# Patient Record
Sex: Female | Born: 1950
Health system: Southern US, Community
[De-identification: ages and names within clinical notes are randomized; demographics above are authoritative.]

## PROBLEM LIST (undated history)

## (undated) DIAGNOSIS — F458 Other somatoform disorders: Secondary | ICD-10-CM

## (undated) DIAGNOSIS — M19019 Primary osteoarthritis, unspecified shoulder: Secondary | ICD-10-CM

## (undated) DIAGNOSIS — M1990 Unspecified osteoarthritis, unspecified site: Secondary | ICD-10-CM

## (undated) DIAGNOSIS — M8580 Other specified disorders of bone density and structure, unspecified site: Secondary | ICD-10-CM

## (undated) DIAGNOSIS — I639 Cerebral infarction, unspecified: Secondary | ICD-10-CM

## (undated) DIAGNOSIS — G2581 Restless legs syndrome: Secondary | ICD-10-CM

## (undated) DIAGNOSIS — G4733 Obstructive sleep apnea (adult) (pediatric): Secondary | ICD-10-CM

## (undated) DIAGNOSIS — R2689 Other abnormalities of gait and mobility: Secondary | ICD-10-CM

## (undated) DIAGNOSIS — M26609 Unspecified temporomandibular joint disorder, unspecified side: Secondary | ICD-10-CM

## (undated) DIAGNOSIS — I7389 Other specified peripheral vascular diseases: Secondary | ICD-10-CM

## (undated) DIAGNOSIS — E039 Hypothyroidism, unspecified: Secondary | ICD-10-CM

## (undated) DIAGNOSIS — Z974 Presence of external hearing-aid: Secondary | ICD-10-CM

## (undated) DIAGNOSIS — R413 Other amnesia: Secondary | ICD-10-CM

## (undated) DIAGNOSIS — H9319 Tinnitus, unspecified ear: Secondary | ICD-10-CM

## (undated) DIAGNOSIS — H269 Unspecified cataract: Secondary | ICD-10-CM

## (undated) DIAGNOSIS — I700 Atherosclerosis of aorta: Secondary | ICD-10-CM

## (undated) DIAGNOSIS — N393 Stress incontinence (female) (male): Secondary | ICD-10-CM

## (undated) DIAGNOSIS — M75102 Unspecified rotator cuff tear or rupture of left shoulder, not specified as traumatic: Secondary | ICD-10-CM

## (undated) DIAGNOSIS — I7300 Raynaud's syndrome without gangrene: Secondary | ICD-10-CM

## (undated) DIAGNOSIS — R269 Unspecified abnormalities of gait and mobility: Secondary | ICD-10-CM

## (undated) DIAGNOSIS — Z8673 Personal history of transient ischemic attack (TIA), and cerebral infarction without residual deficits: Secondary | ICD-10-CM

## (undated) DIAGNOSIS — F329 Major depressive disorder, single episode, unspecified: Secondary | ICD-10-CM

## (undated) DIAGNOSIS — F32A Depression, unspecified: Secondary | ICD-10-CM

## (undated) DIAGNOSIS — R296 Repeated falls: Secondary | ICD-10-CM

## (undated) DIAGNOSIS — I10 Essential (primary) hypertension: Secondary | ICD-10-CM

## (undated) DIAGNOSIS — I2510 Atherosclerotic heart disease of native coronary artery without angina pectoris: Secondary | ICD-10-CM

## (undated) DIAGNOSIS — M24112 Other articular cartilage disorders, left shoulder: Secondary | ICD-10-CM

## (undated) HISTORY — DX: Obstructive sleep apnea (adult) (pediatric): G47.33

## (undated) HISTORY — DX: Restless legs syndrome: G25.81

## (undated) HISTORY — DX: Other specified disorders of bone density and structure, unspecified site: M85.80

## (undated) HISTORY — DX: Other abnormalities of gait and mobility: R26.89

## (undated) HISTORY — DX: Other amnesia: R41.3

## (undated) HISTORY — DX: Atherosclerosis of aorta: I70.0

## (undated) HISTORY — DX: Stress incontinence (female) (male): N39.3

## (undated) HISTORY — DX: Unspecified abnormalities of gait and mobility: R26.9

## (undated) HISTORY — PX: TENDON REPAIR: SHX5111

## (undated) HISTORY — PX: OTHER SURGICAL HISTORY: SHX169

## (undated) HISTORY — DX: Raynaud's syndrome without gangrene: I73.00

## (undated) HISTORY — DX: Atherosclerotic heart disease of native coronary artery without angina pectoris: I25.10

## (undated) HISTORY — PX: TONSILLECTOMY: SUR1361

## (undated) HISTORY — DX: Repeated falls: R29.6

## (undated) HISTORY — DX: Essential (primary) hypertension: I10

## (undated) HISTORY — PX: WRIST SURGERY: SHX841

## (undated) HISTORY — DX: Tinnitus, unspecified ear: H93.19

---

## 2001-06-21 ENCOUNTER — Ambulatory Visit (HOSPITAL_COMMUNITY): Admission: RE | Admit: 2001-06-21 | Discharge: 2001-06-21 | Payer: Self-pay | Admitting: Gastroenterology

## 2004-08-03 ENCOUNTER — Ambulatory Visit: Payer: Self-pay | Admitting: Unknown Physician Specialty

## 2005-06-08 ENCOUNTER — Ambulatory Visit: Payer: Self-pay | Admitting: Family Medicine

## 2005-08-09 ENCOUNTER — Ambulatory Visit: Payer: Self-pay | Admitting: Unknown Physician Specialty

## 2006-09-05 ENCOUNTER — Ambulatory Visit: Payer: Self-pay | Admitting: Unknown Physician Specialty

## 2007-10-15 ENCOUNTER — Ambulatory Visit: Payer: Self-pay | Admitting: Unknown Physician Specialty

## 2008-11-26 ENCOUNTER — Ambulatory Visit: Payer: Self-pay | Admitting: Unknown Physician Specialty

## 2008-12-02 ENCOUNTER — Ambulatory Visit: Payer: Self-pay | Admitting: Unknown Physician Specialty

## 2009-01-21 ENCOUNTER — Ambulatory Visit: Payer: Self-pay | Admitting: Internal Medicine

## 2009-05-27 ENCOUNTER — Ambulatory Visit: Payer: Self-pay | Admitting: Family Medicine

## 2009-05-31 ENCOUNTER — Encounter: Admission: RE | Admit: 2009-05-31 | Discharge: 2009-05-31 | Payer: Self-pay | Admitting: Chiropractic Medicine

## 2009-12-15 ENCOUNTER — Ambulatory Visit: Payer: Self-pay | Admitting: Unknown Physician Specialty

## 2010-08-14 DIAGNOSIS — I2510 Atherosclerotic heart disease of native coronary artery without angina pectoris: Secondary | ICD-10-CM

## 2010-08-14 HISTORY — DX: Atherosclerotic heart disease of native coronary artery without angina pectoris: I25.10

## 2011-01-03 ENCOUNTER — Ambulatory Visit: Payer: Self-pay | Admitting: Unknown Physician Specialty

## 2011-01-05 ENCOUNTER — Ambulatory Visit: Payer: Self-pay | Admitting: Unknown Physician Specialty

## 2011-01-25 ENCOUNTER — Ambulatory Visit (HOSPITAL_COMMUNITY): Payer: BC Managed Care – PPO

## 2011-01-25 ENCOUNTER — Ambulatory Visit (HOSPITAL_COMMUNITY)
Admission: RE | Admit: 2011-01-25 | Discharge: 2011-01-26 | Disposition: A | Discharge status: Home / Self Care | Payer: BC Managed Care – PPO | Source: Ambulatory Visit | Attending: Interventional Cardiology | Admitting: Interventional Cardiology

## 2011-01-25 DIAGNOSIS — H532 Diplopia: Secondary | ICD-10-CM | POA: Insufficient documentation

## 2011-01-25 DIAGNOSIS — F329 Major depressive disorder, single episode, unspecified: Secondary | ICD-10-CM | POA: Insufficient documentation

## 2011-01-25 DIAGNOSIS — I6340 Cerebral infarction due to embolism of unspecified cerebral artery: Secondary | ICD-10-CM | POA: Insufficient documentation

## 2011-01-25 DIAGNOSIS — F3289 Other specified depressive episodes: Secondary | ICD-10-CM | POA: Insufficient documentation

## 2011-01-25 DIAGNOSIS — E785 Hyperlipidemia, unspecified: Secondary | ICD-10-CM | POA: Insufficient documentation

## 2011-01-25 DIAGNOSIS — R9439 Abnormal result of other cardiovascular function study: Secondary | ICD-10-CM | POA: Insufficient documentation

## 2011-01-25 DIAGNOSIS — I2510 Atherosclerotic heart disease of native coronary artery without angina pectoris: Secondary | ICD-10-CM | POA: Insufficient documentation

## 2011-01-25 DIAGNOSIS — I679 Cerebrovascular disease, unspecified: Secondary | ICD-10-CM | POA: Insufficient documentation

## 2011-01-25 DIAGNOSIS — I639 Cerebral infarction, unspecified: Secondary | ICD-10-CM

## 2011-01-25 HISTORY — PX: CORONARY ANGIOPLASTY WITH STENT PLACEMENT: SHX49

## 2011-01-25 HISTORY — DX: Cerebral infarction, unspecified: I63.9

## 2011-01-25 LAB — POCT ACTIVATED CLOTTING TIME: Activated Clotting Time: 452 seconds

## 2011-01-26 DIAGNOSIS — R42 Dizziness and giddiness: Secondary | ICD-10-CM

## 2011-01-26 LAB — CBC
HCT: 38.5 % (ref 36.0–46.0)
MCV: 92.1 fL (ref 78.0–100.0)
RBC: 4.18 MIL/uL (ref 3.87–5.11)

## 2011-01-26 LAB — BASIC METABOLIC PANEL
BUN: 13 mg/dL (ref 6–23)
Chloride: 103 mEq/L (ref 96–112)
Creatinine, Ser: 0.74 mg/dL (ref 0.4–1.2)
GFR calc Af Amer: >60 mL/min (ref >60)
Glucose, Bld: 88 mg/dL (ref 70–99)
Potassium: 4 mEq/L (ref 3.5–5.1)

## 2011-01-26 LAB — LIPID PANEL
HDL: 58 mg/dL (ref >39)
LDL Cholesterol: 120 mg/dL — ABNORMAL HIGH (ref 0–99)
Triglycerides: 88 mg/dL (ref <150)

## 2011-01-26 LAB — AST: AST: 28 U/L (ref 0–37)

## 2011-02-16 NOTE — Discharge Summary (Signed)
  Beth Christian, STALLING NO.:  [id number]  MEDICAL RECORD NO.:  [id number]  LOCATION:  6529                         FACILITY:  [facility]  PHYSICIAN:  Lyn Records, M.D.   DATE OF BIRTH:  07/04/1951  DATE OF ADMISSION:  01/25/2011 DATE OF DISCHARGE:  01/26/2011                              DISCHARGE SUMMARY   This is a correction to the discharge medication list:  Effient has not been prescribed.  The patient will be on Plavix 75 mg daily.  Also nitroglycerin 0.4 mg sublingually p.r.n. is prescribed.  Additionally, the patient was seen by Neurology and cleared for discharge.  She is felt to have a good prognosis for complete recovery. She will be discharged today pending the results of carotid Doppler evaluation.     Lyn Records, M.D.     HWS/MEDQ  D:  01/26/2011  T:  01/27/2011  Job:  [id number]  Electronically Signed by Verdis Prime M.D. on 02/16/2011 01:38:11 PM

## 2011-02-16 NOTE — Cardiovascular Report (Signed)
Beth Christian, Beth Christian NO.:  [id number]  MEDICAL RECORD NO.:  [id number]  LOCATION:  6529                         FACILITY:  [facility]  PHYSICIAN:  Lyn Records, M.D.   DATE OF BIRTH:  03-29-1951  DATE OF PROCEDURE:  01/25/2011 DATE OF DISCHARGE:                           CARDIAC CATHETERIZATION   INDICATIONS FOR PROCEDURE:  The patient is 42 and had an early positive exercise treadmill test performed today in our office.  The treadmill was remarkable for inferior ST elevation.  No chest pain occurred. Because of this abnormality, we decided to do urgent catheterization to define coronary anatomy.  She gives a 6-8 week history of exertional angina, although states that she has had no chest discomfort in the past 2 weeks.  PROCEDURE PERFORMED: 1. Left heart catheterization via the right radial approach. 2. Coronary angiography. 3. Left ventriculography. 4. Drug-eluting stent proximal RCA.  DESCRIPTION:  A 5-French sheath was inserted into the right radial artery using micropuncture technique.  We then gave 50 units per kg of heparin.  Verapamil was given intra-arterially to prevent spasm.  We then advanced into the ascending aorta over a tight J 0.035 exchange length guidewire.  Using an A2 5-French multipurpose catheter, we performed left ventriculography, right coronary angiography, and flush injections of the left coronary.  Of note is heavy focal calcification in the mid RCA, the distal left main, and the mid left anterior descending.  We used a 3.5 x 5-French left Judkins catheter for left coronary angiography.  We could not get coaxial images and changed to a 3.0 cm left coronary 5-French guide catheter for left coronary diagnostic images.  There was mild catheter damping with engagement in the left main.  Flow was brisk.  After studying the patient's images and correlating this with the patient's presenting clinical data, we decided to perform PCI on  the right coronary.  We used a 5-French Judkins right guide catheter and a BMW guidewire. Angiomax bolus followed by an infusion was given.  A 600 mg of Plavix was administered.  We used a 2.5-mm x 12-mm long balloon.  Two balloon inflations were performed at 10 atmospheres.  We then positioned and deployed a 3.0 x 16-mm long Promus element stent to 12 atmospheres.  Two balloon inflations were performed.  We then postdilated with a 325 x 12- mm Brandon Quantum.  Two balloon inflations to 14 atmospheres were performed. The patient had ST-segment changes with each balloon inflation but no chest pain.  The final angiographic images revealed a nice result with brisk flow.  A 400 mcg of intracoronary nitroglycerin was administered during the case. Angiomax was discontinued after the last post still balloon inflation.  A wristband was applied with good hemostasis.  No significant complications occurred during this procedure.  RESULTS:  I  Hemodynamic data: A.  Aortic pressure 143/77. B.  Left ventricular pressure 143/27 mmHg. 1. Left ventriculography:  Left ventricular cavity size and function     are normal, EF is 65%. 2. Coronary angiography.     a.     Left main coronary:  There is 25-30% narrowing in the      proximal left main.  The distal left main is heavily calcified.      Catheter damping is noted with engagement with Judkins catheter      due to wedging.  Left catheter damping is noted with a 5-French      EBU guide catheter.  Final grade on the left main is 20-40%      stenosis.     b.     Left anterior descending coronary:  Left anterior descending      is widely patent but with irregularities in the midvessel and      heavy calcification.  Three diagonal branches arise from the left      anterior descending.  They are all widely patent.  The LAD is      transapical.  There is up to 30-40% narrowing in the mid LAD.     c.     Circumflex artery:  Two obtuse marginal branches  arise from      the circumflex.  The territory supplied is relatively small.  No      high-grade obstruction is seen.     d.     Right coronary:  The right coronary is dominant and heavily      calcified.  Gives origin to a large PDA that contains proximal 50%      narrowing.  Three left ventricular branches arise from it.  The      proximal right coronary contains somewhat concentric 95% stenosis.      There is mid vessel calcification with 50% narrowing.  TIMI flow      was grade 3. 3. PCI of the right coronary:  RCA 95%, proximal stenosis reduced to     0% with TIMI grade 3 flow using a Promus element drug-eluting     stent.  TIMI grade 3 flow was noted.  CONCLUSIONS: 1. Early positive exercise treadmill test with inferior ST elevation     secondary to high-grade proximal RCA stenosis. 2. Successful drug-eluting stent implantation in the proximal RCA     resulting in 0% stenosis and TIMI grade 3 flow. 3. Heavy calcification of both the left and right coronaries as     outlined above and 25-30% proximal left main.  PLAN:  Aggressive risk factor modification, Effient therapy for at least 12 months.  Close clinical followup.     Lyn Records, M.D.     HWS/MEDQ  D:  01/25/2011  T:  01/26/2011  Job:  [id number]  cc:   Thora Lance, M.D.  Electronically Signed by Verdis Prime M.D. on 02/16/2011 01:37:31 PM

## 2011-02-16 NOTE — Discharge Summary (Signed)
NAMELINNELL, SWORDS NO.:  [id number]  MEDICAL RECORD NO.:  [id number]  LOCATION:  6529                         FACILITY:  [facility]  PHYSICIAN:  Lyn Records, M.D.   DATE OF BIRTH:  15-May-1951  DATE OF ADMISSION:  01/25/2011 DATE OF DISCHARGE:  01/26/2011                              DISCHARGE SUMMARY   REASON FOR ADMISSION:  Early positive excise treadmill test with ST- segment elevation on exercise within the first 4 minutes of treadmill testing.  DISCHARGE DIAGNOSES: 1. Coronary atherosclerotic heart disease.     a.     Early positive excise treadmill test on January 25, 2011.     b.     Coronary angiography performed on January 25, 2011,      demonstrating 99% proximal right coronary artery stenosis as the      likely culprit for the patient's abnormal exercise test and there      is 25-40% proximal left main.  Both coronaries were heavily      calcified and visible without contrast injection.     c.     Successful stenting with drug-eluting stent of the proximal      right coronary artery. 2. Embolic cerebrovascular accident likely occurring at the time of     catheterization resulting in double vision.     a.     Small pontine acute infarction by MRI/MRA. 3. Cerebrovascular disease with evidence of intracranial microvascular     disease and cerebral atrophy. 4. Hyperlipidemia. 5. History of depression.  PROCEDURES PERFORMED: 1. Diagnostic catheterization and PCI of the RCA January 25, 2011, Dr.     Verdis Prime. 2. CT scan of the brain January 25, 2011. 3. MRI of the brain January 25, 2011. 4. Carotid ultrasound January 26, 2011, pending at the time of discharge.  CONSULT:  Dr. Terrace Arabia, [facility].  DISCHARGE INSTRUCTIONS: 1. Phase II cardiac rehab. 2. Follow up with [facility]. 3. Follow up with Dr. Verdis Prime on February 06, 2011, at 3:45 p.m.  MEDICATIONS: 1. Aspirin 325 mg per day. 2. Prasugrel 10 mg per day. 3.  Rosuvastatin 20 mg daily. 4. Bupropion XL 150 mg three times daily. 5. Calcium OTC 1 tablet daily. 6. Citalopram 20 mg daily. 7. Clonazepam 0.5 mg daily at bedtime. 8. Levothyroxine 75 mcg per day. 9. Multiple vitamins one daily. 10.Omega-3 fatty acid one daily. 11.Vitamin B12 one daily. 12.Vitamin D3 one daily. 13.Vitamin B1 daily. 14.Discontinue meloxicam.  ACTIVITY:  As tolerated and instructed by Cardiac Rehab.  RETURN TO WORK:  After February 06, 2011.  SPECIAL PRECAUTIONS:  Call if new neurological symptoms or chest pain.  HISTORY AND PHYSICAL AND HOSPITAL COURSE:  Please see the transcribed history and physical from my office dated January 25, 2011.  Basically, the patient has less than 66-month history of exertional chest pain and had an exercise Cardiolite performed at the office on the day of admission. She was unable to walk beyond 5 minutes and developed inferior ST elevation with exercise but in absence of chest pain.  Because of this, the patient and family were instructed to undergo coronary angiography. We discussed performing this electively  but because of personal family issues the decision was made to go ahead and send the patient to the hospital and perform the procedure on the day of admission, January 25, 2011.  Please see the dictated cardiac catheterization and PCI report.  She was found to have heavily calcified coronary arteries that were visible by fluoroscopy involving especially the proximal and mid right coronary, the distal left main and the mid LAD territory.  Angiography demonstrated high-grade obstruction in the proximal RCA which was compatible with abnormalities noted on the stress test.  There was mild- to-moderate disease in the ostial/proximal left main in the mid LAD but not critically significant.  LV function was normal.  DES stent was implanted successfully via the right radial approach.  After the procedure and while discussing the results  with the patient, she complained of double vision as I showed her the images on the monitors in the cath lab.  She was also somewhat groggy and still sedated from Versed and fentanyl.  In recovery 15-30 minutes later, she still complained of having diplopia.  At that point, I called a code stroke.  The patient was then evaluated by the Neurology Service and had CT and MR scans performed and felt to have had a small embolic pontine CVA. The patient's major clinical finding is double vision.  Eye patch was recommended by Dr. Terrace Arabia.  Carotid Doppler studies are pending at the time of this dictation.  The patient has not had any chest discomfort or EKG changes of significance.  Discharge laboratory data revealed normal electrolytes and renal function.  Discharge will be pending further evaluation and recommendations from the Neurology Service.     Lyn Records, M.D.     HWS/MEDQ  D:  01/26/2011  T:  01/26/2011  Job:  [id number]  cc:   Levert Feinstein, MD Thora Lance, M.D.  Electronically Signed by Verdis Prime M.D. on 02/16/2011 01:37:44 PM

## 2011-02-27 ENCOUNTER — Encounter: Payer: Self-pay | Admitting: Interventional Cardiology

## 2011-03-07 NOTE — Consult Note (Signed)
Beth Christian, DEJARNETT NO.:  [id number]  MEDICAL RECORD NO.:  [id number]  LOCATION:  6529                         FACILITY:  [facility]  PHYSICIAN:  Levert Feinstein, MD          DATE OF BIRTH:  [birth year]  DATE OF CONSULTATION: DATE OF DISCHARGE:                                CONSULTATION   CHIEF COMPLAINT:  Acute onset of double vision.  HISTORY OF PRESENT ILLNESS:  The patient is a pleasant 60 year old right- handed Caucasian female with her husband at the bedside.  She underwent cardiac catheter this afternoon, found to have significant right coronary artery disease, had drug-eluting stent placement.  She underwent procedure around 4:45 p.m., was given repeated dose of versed, also fentanyl.  She also received heparin bonus 3200 units at4:57 p.m., Plavix 600 mg p.o. at 5:33 p.m.  When she came out of the procedure, became more alert.  She noticed double vision.  It was described as vertigo, binocular, persistent since its onset.  She denies vertigo, lateralized motor or sensory deficit. This has led to a CAT scan of the brain.  There were no acute findings. MRI of the brain has demonstrated faint positive DWI lesions involving right pontine paramedian, left frontal subcortical white matters.  There was also mild brain atrophy, periventricular white matter disease.  REVIEW OF SYSTEMS:  Pertinent as above.  PAST MEDICAL HISTORY: 1. Depression. 2. Coronary artery disease.  PAST SURGICAL HISTORY:  Thumb surgery post injury.  ALLERGIES:  No known drug allergy.  FAMILY HISTORY:  Father had coronary artery disease at age 106, also suffered kidney disease.  Mother at age 60, is healthy.  SOCIAL HISTORY:  She works in [employer], community, plan to take a few weeks off during summer.  Denies smoking or drinking.  HOME MEDICATIONS: 1. Bupropion XL 150 mg 3 times a day. 2. Citalopram 20 mg every day. 3. Levothyroxine 75 mcg every day. 4. Meloxicam 15 mg every day. 5.  Clonazepam 0.5 mg at bedtime. 6. Aspirin 81 mg every day. 7. Omega-3 one capsule every day. 8. Multivitamin 1 tablet every day. 9. Vitamin D3 one tablet every day. 10.Calcium every day. 11.Vitamin E. 12.Vitamin B12.  PHYSICAL EXAMINATION:  VITAL SIGNS:  She is afebrile, blood pressure 126/68, heart rate of 68. NEUROLOGICAL:  She is awake, alert.  No dysarthria.  No aphasia. Cranial nerves II through XII; pupils equal, round, reactive to light. Mild disconjugate eye movement.  Right hyperphoria, slight left hypophoria, and full extraocular movement and the facial sensation and strength was normal.  Uvula and tongue midline.  Head turning and shoulder shrugging normal, symmetric.  Tongue protrusion into cheek strength was normal.  Motor examination; normal tone, bulk, and strength on 4 extremities.  Sensory normal to light touch and there was no extinction on double spontaneous stimulation.  There was no dysmetria noticed.  ASSESSMENT AND PLAN:  A 61 year old female with vascular risk factor of coronary artery disease, status post cardiac catheter, now presenting with vertical double vision, consistent with a small brainstem acute stroke, likely embolic event.  1. Keep antiplatelet agent. 2. She is not a t-PA candidate because of unknown time of onset,  also     has received a heparin, large dose of Plavix during her cardiac     catheter 3. Eye patch. 4. Ultrasound of carotid artery, will be followed by stroke MD, Dr.     Pennie Rushing in the morning.     Levert Feinstein, MD     YY/MEDQ  D:  01/25/2011  T:  01/26/2011  Job:  [id number]  Electronically Signed by Levert Feinstein MD on 03/07/2011 08:28:41 AM

## 2011-03-08 ENCOUNTER — Ambulatory Visit: Payer: Self-pay | Admitting: Ophthalmology

## 2011-03-15 ENCOUNTER — Encounter: Payer: Self-pay | Admitting: Interventional Cardiology

## 2011-04-15 ENCOUNTER — Encounter: Payer: Self-pay | Admitting: Interventional Cardiology

## 2011-07-04 ENCOUNTER — Encounter: Payer: Self-pay | Admitting: Emergency Medicine

## 2011-07-04 ENCOUNTER — Emergency Department (HOSPITAL_COMMUNITY): Payer: BC Managed Care – PPO

## 2011-07-04 ENCOUNTER — Inpatient Hospital Stay (HOSPITAL_COMMUNITY)
Admission: EM | Admit: 2011-07-04 | Discharge: 2011-07-05 | DRG: 143 | Disposition: A | Discharge status: Home / Self Care | Payer: BC Managed Care – PPO | Attending: Interventional Cardiology | Admitting: Interventional Cardiology

## 2011-07-04 ENCOUNTER — Other Ambulatory Visit: Payer: Self-pay

## 2011-07-04 DIAGNOSIS — I2510 Atherosclerotic heart disease of native coronary artery without angina pectoris: Secondary | ICD-10-CM | POA: Diagnosis present

## 2011-07-04 DIAGNOSIS — R079 Chest pain, unspecified: Principal | ICD-10-CM | POA: Diagnosis present

## 2011-07-04 DIAGNOSIS — F32A Depression, unspecified: Secondary | ICD-10-CM | POA: Insufficient documentation

## 2011-07-04 DIAGNOSIS — F329 Major depressive disorder, single episode, unspecified: Secondary | ICD-10-CM | POA: Diagnosis present

## 2011-07-04 DIAGNOSIS — I7389 Other specified peripheral vascular diseases: Secondary | ICD-10-CM | POA: Insufficient documentation

## 2011-07-04 DIAGNOSIS — E039 Hypothyroidism, unspecified: Secondary | ICD-10-CM | POA: Diagnosis present

## 2011-07-04 DIAGNOSIS — N393 Stress incontinence (female) (male): Secondary | ICD-10-CM | POA: Insufficient documentation

## 2011-07-04 DIAGNOSIS — H9319 Tinnitus, unspecified ear: Secondary | ICD-10-CM | POA: Diagnosis present

## 2011-07-04 DIAGNOSIS — Z7902 Long term (current) use of antithrombotics/antiplatelets: Secondary | ICD-10-CM

## 2011-07-04 DIAGNOSIS — Z8673 Personal history of transient ischemic attack (TIA), and cerebral infarction without residual deficits: Secondary | ICD-10-CM

## 2011-07-04 DIAGNOSIS — F3289 Other specified depressive episodes: Secondary | ICD-10-CM | POA: Diagnosis present

## 2011-07-04 DIAGNOSIS — M1990 Unspecified osteoarthritis, unspecified site: Secondary | ICD-10-CM | POA: Insufficient documentation

## 2011-07-04 DIAGNOSIS — Z7982 Long term (current) use of aspirin: Secondary | ICD-10-CM

## 2011-07-04 DIAGNOSIS — Z87891 Personal history of nicotine dependence: Secondary | ICD-10-CM

## 2011-07-04 HISTORY — DX: Other specified peripheral vascular diseases: I73.89

## 2011-07-04 HISTORY — DX: Unspecified osteoarthritis, unspecified site: M19.90

## 2011-07-04 HISTORY — DX: Depression, unspecified: F32.A

## 2011-07-04 HISTORY — DX: Hypothyroidism, unspecified: E03.9

## 2011-07-04 HISTORY — DX: Stress incontinence (female) (male): N39.3

## 2011-07-04 HISTORY — DX: Major depressive disorder, single episode, unspecified: F32.9

## 2011-07-04 HISTORY — DX: Cerebral infarction, unspecified: I63.9

## 2011-07-04 LAB — COMPREHENSIVE METABOLIC PANEL
ALT: 6 U/L (ref 0–35)
AST: 22 U/L (ref 0–37)
Albumin: 3.6 g/dL (ref 3.5–5.2)
Alkaline Phosphatase: 94 U/L (ref 39–117)
BUN: 17 mg/dL (ref 6–23)
BUN: 16 mg/dL (ref 6–23)
CO2: 31 mEq/L (ref 19–32)
Calcium: 9 mg/dL (ref 8.4–10.5)
Chloride: 103 mEq/L (ref 96–112)
Chloride: 100 mEq/L (ref 96–112)
Creatinine, Ser: 0.91 mg/dL (ref 0.50–1.10)
GFR calc Af Amer: 78 mL/min — ABNORMAL LOW (ref >90)
GFR calc Af Amer: 83 mL/min — ABNORMAL LOW (ref >90)
GFR calc non Af Amer: 67 mL/min — ABNORMAL LOW (ref >90)
Glucose, Bld: 83 mg/dL (ref 70–99)
Glucose, Bld: 146 mg/dL — ABNORMAL HIGH (ref 70–99)
Potassium: 3.6 mEq/L (ref 3.5–5.1)
Sodium: 138 mEq/L (ref 135–145)
Total Bilirubin: 0.3 mg/dL (ref 0.3–1.2)
Total Bilirubin: 0.2 mg/dL — ABNORMAL LOW (ref 0.3–1.2)
Total Protein: 6.5 g/dL (ref 6.0–8.3)

## 2011-07-04 LAB — CBC
HCT: 41.9 % (ref 36.0–46.0)
Hemoglobin: 14.6 g/dL (ref 12.0–15.0)
MCV: 90.5 fL (ref 78.0–100.0)
RDW: 12.3 % (ref 11.5–15.5)
WBC: 4.7 10*3/uL (ref 4.0–10.5)

## 2011-07-04 LAB — DIFFERENTIAL
Basophils Absolute: 0 10*3/uL (ref 0.0–0.1)
Eosinophils Relative: 0 % (ref 0–5)
Lymphocytes Relative: 38 % (ref 12–46)
Lymphs Abs: 1.8 10*3/uL (ref 0.7–4.0)
Monocytes Absolute: 0.3 10*3/uL (ref 0.1–1.0)
Monocytes Relative: 6 % (ref 3–12)
Neutro Abs: 2.6 10*3/uL (ref 1.7–7.7)

## 2011-07-04 LAB — TROPONIN I: Troponin I: <0.3 ng/mL (ref <0.30)

## 2011-07-04 LAB — CARDIAC PANEL(CRET KIN+CKTOT+MB+TROPI)
CK, MB: 2.4 ng/mL (ref 0.3–4.0)
Relative Index: INVALID (ref 0.0–2.5)
Total CK: 53 U/L (ref 7–177)
Troponin I: <0.3 ng/mL (ref <0.30)

## 2011-07-04 LAB — CK TOTAL AND CKMB (NOT AT ARMC): Relative Index: INVALID (ref 0.0–2.5)

## 2011-07-04 LAB — TSH: TSH: 0.718 u[IU]/mL (ref 0.350–4.500)

## 2011-07-04 MED ORDER — CLONAZEPAM 0.5 MG PO TABS: 0.5000 mg | ORAL_TABLET | Freq: Every evening | ORAL | Status: DC | PRN

## 2011-07-04 MED ORDER — HEPARIN BOLUS VIA INFUSION
4000.0000 [IU] | Freq: Once | INTRAVENOUS | Status: AC
  Administered 2011-07-04: 4000 [IU] via INTRAVENOUS
  Filled 2011-07-04: qty 4000

## 2011-07-04 MED ORDER — ONDANSETRON HCL 4 MG/2ML IJ SOLN: 4.0000 mg | Freq: Four times a day (QID) | INTRAMUSCULAR | Status: DC | PRN

## 2011-07-04 MED ORDER — NITROGLYCERIN 0.4 MG SL SUBL: 0.4000 mg | SUBLINGUAL_TABLET | SUBLINGUAL | Status: DC | PRN

## 2011-07-04 MED ORDER — IOHEXOL 350 MG/ML SOLN
100.0000 mL | Freq: Once | INTRAVENOUS | Status: AC | PRN
  Administered 2011-07-04: 100 mL via INTRAVENOUS

## 2011-07-04 MED ORDER — ASPIRIN EC 81 MG PO TBEC
81.0000 mg | DELAYED_RELEASE_TABLET | Freq: Every day | ORAL | Status: DC
  Filled 2011-07-04: qty 1

## 2011-07-04 MED ORDER — METOPROLOL TARTRATE 12.5 MG HALF TABLET
12.5000 mg | ORAL_TABLET | Freq: Two times a day (BID) | ORAL | Status: DC
  Administered 2011-07-04 – 2011-07-05 (×2): 12.5 mg via ORAL
  Filled 2011-07-04 (×4): qty 1

## 2011-07-04 MED ORDER — HEPARIN (PORCINE) IN NACL 100-0.45 UNIT/ML-% IJ SOLN
800.0000 [IU]/h | INTRAMUSCULAR | Status: DC
  Administered 2011-07-04: 800 [IU]/h via INTRAVENOUS
  Filled 2011-07-04 (×2): qty 250

## 2011-07-04 MED ORDER — ACETAMINOPHEN 325 MG PO TABS
650.0000 mg | ORAL_TABLET | ORAL | Status: DC | PRN
  Administered 2011-07-05: 650 mg via ORAL
  Filled 2011-07-04: qty 2

## 2011-07-04 MED ORDER — CLOPIDOGREL BISULFATE 75 MG PO TABS
75.0000 mg | ORAL_TABLET | Freq: Every day | ORAL | Status: DC
  Administered 2011-07-05: 75 mg via ORAL
  Filled 2011-07-04: qty 1

## 2011-07-04 MED ORDER — THERA M PLUS PO TABS
1.0000 | ORAL_TABLET | Freq: Every day | ORAL | Status: DC
  Administered 2011-07-05: 1 via ORAL
  Filled 2011-07-04 (×3): qty 1

## 2011-07-04 MED ORDER — BUPROPION HCL ER (XL) 150 MG PO TB24
150.0000 mg | ORAL_TABLET | Freq: Three times a day (TID) | ORAL | Status: DC
  Administered 2011-07-04 – 2011-07-05 (×3): 150 mg via ORAL
  Filled 2011-07-04 (×4): qty 1

## 2011-07-04 MED ORDER — LEVOTHYROXINE SODIUM 75 MCG PO TABS
75.0000 ug | ORAL_TABLET | Freq: Every day | ORAL | Status: DC
  Administered 2011-07-05: 75 ug via ORAL
  Filled 2011-07-04 (×2): qty 1

## 2011-07-04 MED ORDER — CITALOPRAM HYDROBROMIDE 20 MG PO TABS
20.0000 mg | ORAL_TABLET | Freq: Every day | ORAL | Status: DC
  Administered 2011-07-05: 20 mg via ORAL
  Filled 2011-07-04 (×2): qty 1

## 2011-07-04 MED ORDER — NITROGLYCERIN 2 % TD OINT
1.0000 [in_us] | TOPICAL_OINTMENT | Freq: Four times a day (QID) | TRANSDERMAL | Status: DC
  Administered 2011-07-05: 1 [in_us] via TOPICAL
  Filled 2011-07-04 (×2): qty 30

## 2011-07-04 MED ORDER — ASPIRIN 81 MG PO CHEW: 324.0000 mg | CHEWABLE_TABLET | ORAL | Status: DC

## 2011-07-04 MED ORDER — ASPIRIN 300 MG RE SUPP: 300.0000 mg | RECTAL | Status: DC

## 2011-07-04 MED ORDER — ASPIRIN EC 325 MG PO TBEC
325.0000 mg | DELAYED_RELEASE_TABLET | Freq: Every day | ORAL | Status: DC
  Administered 2011-07-05: 325 mg via ORAL
  Filled 2011-07-04: qty 1

## 2011-07-04 NOTE — Progress Notes (Signed)
ANTICOAGULATION CONSULT NOTE - Initial Consult  Pharmacy Consult for Heparin Indication: chest pain/ACS  No Known Allergies  Patient Measurements: Height: 5\' 3"  (160 cm) Weight: 152 lb 1.9 oz (69 kg) IBW/kg (Calculated) : 52.4  Heparin dosing weight: 66.6kg  Vital Signs: Temp: 98.1 F (36.7 C) (11/20 1446) Temp src: Oral (11/20 1446) BP: 140/67 mmHg (11/20 1644) Pulse Rate: 67  (11/20 1644)  Labs:  Basename 07/04/11 1423 07/04/11 1422  HGB -- 14.6  HCT -- 41.9  PLT -- 245  APTT -- --  LABPROT -- --  INR -- --  HEPARINUNFRC -- --  CREATININE -- 0.91  CKTOTAL 58 --  CKMB 2.6 --  TROPONINI <0.30 --   Estimated Creatinine Clearance: 61.2 ml/min (by C-G formula based on Cr of 0.91).  Medical History: Past Medical History  Diagnosis Date  . Coronary artery disease     s/p RCA DES   . Depression   . Hypothyroidism   . Tinnitus of both ears   . DJD (degenerative joint disease)   . Urinary, incontinence, stress female   . Acrocyanosis   . CVA (cerebral infarction)     occured during heart cath    Assessment: 60yof with DES to RCA to begin heparin for chest pain. Wt 69kg and CrCl 52ml/min.  Goal of Therapy:  Heparin level 0.3-0.7 units/ml   Plan:  1) Heparin bolus 4000 units x 1 2) Heparin drip at 800 units/hr 3) 6 hour heparin level 4) Daily heparin level and CBC  Fredrik Rigger 07/04/2011,4:56 PM

## 2011-07-04 NOTE — ED Notes (Signed)
Chest tingling started yesterday and pain that started this am

## 2011-07-04 NOTE — H&P (Signed)
Admit date: 07/04/2011 Referring Physician Dr. Kirby Funk Primary Cardiologist Dr. Verdis Prime Chief complaint/reason for admission: Chest pain  HPI: This is a 60 year old white female with a history of coronary artery disease status post drug-eluting stent to the right coronary artery placed in June 2012. At that time she presented with an early positive exercise treadmill test where she was unable to walk beyond 5 minutes and developed inferior ST elevation with exercise in the absence of chest pain. She was admitted urgently to the hospital and underwent coronary angiography revealing a 99% proximal RCA stenosis and 25-40% proximal left main stenosis. Both coronary arteries were heavily calcified and visible without contrast injection.  She underwent a drug-eluting stent placement to the proximal right coronary artery. She suffered an embolic CVA likely occurring at the time of catheterization resulting in double vision. MRI/MRA revealed a small pontine acute infarct. She presented today to the emergency room with complaints of chest pain. Patient states that yesterday she noticed her left arm tingling intermittently and then developed heaviness in her chest. But the symptoms were off and on throughout the day and she did not take any medication.  She was able to get to sleep last night but then today developed similar symptoms. His symptoms were off and on this morning with a weird sensation in her body. Symptoms were similar to what she had prior to her cardiac catheterization. She denied any shortness of breath nausea vomiting or diaphoresis. Currently she denies any chest pain but states that she just feels funny inside.    PMH:    Past Medical History  Diagnosis Date  . Coronary artery disease     s/p RCA DES   . Depression   . Hypothyroidism   . Tinnitus of both ears   . DJD (degenerative joint disease)   . Urinary, incontinence, stress female   . Acrocyanosis   . CVA (cerebral  infarction)     occured during heart cath    PSH:    Past Surgical History  Procedure Date  . Thumb srugery 2004  . Coronary angioplasty with stent placement 2012  . Chest tube for pneumonthorax after rib fracture     ALLERGIES:   Review of patient's allergies indicates no known allergies.  Prior to Admit Meds:   (Not in a hospital admission) Family HX:    Family History  Problem Relation Age of Onset  . Hypertension Mother   . Kidney failure Father     deceased  . Coronary artery disease Father    Social HX:    History   Social History  . Marital Status: Married    Spouse Name: N/A    Number of Children: N/A  . Years of Education: N/A   Occupational History  . Not on file.   Social History Main Topics  . Smoking status: Former Smoker -- 0.5 packs/day for 10 years  . Smokeless tobacco: Not on file  . Alcohol Use: 1.0 oz/week    2 drink(s) per week  . Drug Use: No  . Sexually Active: Not on file   Other Topics Concern  . Not on file   Social History Narrative  . No narrative on file     ROS:  All 11 ROS were addressed and are negative except what is stated in the HPI  PHYSICAL EXAM Filed Vitals:   07/04/11 1446  BP: 144/70  Pulse: 71  Temp: 98.1 F (36.7 C)  Resp: 13   General: Well  developed, well nourished, in no acute distress Head: Eyes PERRLA, No xanthomas.   Normal cephalic and atramatic  Lungs:   Clear bilaterally to auscultation and percussion. Heart:   HRRR S1 S2 Pulses are 2+ & equal.            No carotid bruit. No JVD.  No abdominal bruits. No femoral bruits. Abdomen: Bowel sounds are positive, abdomen soft and non-tender without masses or                  Hernia's noted. Msk:  Back normal, normal gait. Normal strength and tone for age. Extremities:   No clubbing, cyanosis or edema.  DP +1 Neuro: Alert and oriented X 3. Psych:  Good affect, responds appropriately   Labs:   Lab Results  Component Value Date   WBC 4.7 07/04/2011    HGB 14.6 07/04/2011   HCT 41.9 07/04/2011   MCV 90.5 07/04/2011   PLT 245 07/04/2011    Lab 07/04/11 1422  NA 141  K 3.8  CL 103  CO2 31  BUN 17  CREATININE 0.91  CALCIUM 9.0  PROT 7.5  BILITOT 0.3  ALKPHOS 109  ALT 7  AST 22  GLUCOSE 83   Lab Results  Component Value Date   CKTOTAL 58 07/04/2011   CKMB 2.6 07/04/2011   TROPONINI <0.30 07/04/2011       Lab Results  Component Value Date   CHOL 196 01/26/2011   Lab Results  Component Value Date   HDL 58 01/26/2011   Lab Results  Component Value Date   LDLCALC 120* 01/26/2011   Lab Results  Component Value Date   TRIG 88 01/26/2011   Lab Results  Component Value Date   CHOLHDL 3.4 01/26/2011   No results found for this basename: LDLDIRECT      Radiology:  *RADIOLOGY REPORT*  Clinical Data: Chest pain and left arm numbness. History of  coronary stent.  CHEST - 2 VIEW  Comparison: None.  Findings: No edema, infiltrate, nodule or pleural fluid identified.  Heart size is normal. Lateral projection shows anterior coronary  stent. Bony structures are unremarkable.  IMPRESSION:  No active disease.  Original Report Authenticated By: Reola Calkins, M.D.   EKG:  Normal sinus rhythm  ASSESSMENT:  1. Atypical chest pain with negative cardiac enzymes x1. Her symptoms have been off and on for 24 hours. She is 6 months out from PCI with drug-eluting stent to the RCA. 2. Coronary artery disease status post drug-eluting stent to the RCA June of 2012 with 25-40% proximal left main stenosis as well 3. The CVA with evidence of intracranial microvascular disease and cerebral atrophy with a small pontine acute infarct in the setting of her cardiac catheterization June 2012 4. Dyslipidemia 5. Depression 6. Hypothyroidism 7. DJD 8. Stress urinary incontinence 9. Acrocyanosis 10. Tinnitus   PLAN:   1. Admit to telemetry unit under Dr. Katrinka Blazing 2. Rule out myocardial infarction with serial cardiac enzymes 3. Continue  current medications 4. We'll hold statin drug since patient was told by Dr. Katrinka Blazing to hold for several weeks because of muscular complaints 5. IV heparin drip per pharmacy protocol 6. Nitro paste 1 inch every 6 hours with nitrate free period 12 midnight to 6 AM daily  7. Aspirin 325 mg daily 8. Plavix 75 mg daily 9. N.p.o. after midnight 10. Further workup per Dr. Verdis Prime with  nuclear stress test  Quintella Reichert, MD  07/04/2011  4:18 PM

## 2011-07-04 NOTE — ED Notes (Signed)
Tingling started yesterday and cp that started  This am, no nausea or sob, hank Katrinka Blazing is her cards dr

## 2011-07-04 NOTE — ED Notes (Signed)
Attempted to call report, RN unable to take at this time. Will return call

## 2011-07-04 NOTE — ED Provider Notes (Signed)
Medical screening examination/treatment/procedure(s) were performed by non-physician practitioner and as supervising physician I was immediately available for consultation/collaboration.   Laray Anger, DO 07/04/11 Kristopher Oppenheim

## 2011-07-04 NOTE — ED Provider Notes (Signed)
History     CSN: [id number] Arrival date & time: 07/04/2011  1:21 PM   None     Chief Complaint  Patient presents with  . Chest Pain    (Consider location/radiation/quality/duration/timing/severity/associated sxs/prior treatment) HPI Comments: Patient presents with a chief complaint of chest pain and tingling that started yesterday.  Patient has a history significant for CAD with a drug eluding stent placed by Dr. Katrinka Blazing of the [facility] back in June.  Patient has been taking her Plavix as prescribed, but has not been taking her aspirin.  She did, however, take 325 mg today.  Patient states that yesterday he.  There was a tingling sensation around her heart and today.  It developed into more of a dull, constant pressure & the tingling sensation moved down her left arm.  She states this feeling is similar to when she had her stent placed.  Patient denies any shortness of breath, dyspnea on exertion, leg swelling, pleurisy, wheezing, cough, hemoptysis, diaphoresis, or palpitations.  Patient also denies any weakness or numbness of her extremities, having seizures, or syncope.  Patient is a 60 y.o. female presenting with chest pain. The history is provided by the patient.  Chest Pain     Past Medical History  Diagnosis Date  . Coronary artery disease   . Depression     History reviewed. No pertinent past surgical history.  History reviewed. No pertinent family history.  History  Substance Use Topics  . Smoking status: Not on file  . Smokeless tobacco: Not on file  . Alcohol Use:     OB History    Grav Para Term Preterm Abortions TAB SAB Ect Mult Living                  Review of Systems  Cardiovascular: Positive for chest pain.  All other systems reviewed and are negative.    Allergies  Review of patient's allergies indicates no known allergies.  Home Medications   Current Outpatient Rx  Name Route Sig Dispense Refill  . ASPIRIN 325 MG PO TBEC Oral Take  325 mg by mouth daily.      . BUPROPION HCL ER (XL) 150 MG PO TB24 Oral Take 150 mg by mouth 3 (three) times daily.      Marland Kitchen CALCIUM-VITAMIN D PO Oral Take 1 tablet by mouth daily.      Marland Kitchen VITAMIN D-3 PO Oral Take 1 tablet by mouth daily.      Marland Kitchen CITALOPRAM HYDROBROMIDE 20 MG PO TABS Oral Take 20 mg by mouth daily.      Marland Kitchen CLONAZEPAM 0.5 MG PO TABS Oral Take 0.5 mg by mouth at bedtime as needed. For sleep     . CLOPIDOGREL BISULFATE 75 MG PO TABS Oral Take 75 mg by mouth daily.      Marland Kitchen VITAMIN B-12 PO Oral Take 1 tablet by mouth daily.      . OMEGA-3 FATTY ACIDS 1000 MG PO CAPS Oral Take 1 g by mouth daily.      Marland Kitchen LEVOTHYROXINE SODIUM 75 MCG PO TABS Oral Take 75 mcg by mouth daily.      Carma Leaven M PLUS PO TABS Oral Take 1 tablet by mouth daily.      Marland Kitchen ROSUVASTATIN CALCIUM 20 MG PO TABS Oral Take 20 mg by mouth daily.      Marland Kitchen VITAMIN E PO Oral Take 1 tablet by mouth daily.      Marland Kitchen NITROGLYCERIN 0.4 MG SL SUBL Sublingual  Place 0.4 mg under the tongue every 5 (five) minutes as needed. For chest pain       BP 144/70  Pulse 71  Temp(Src) 98.1 F (36.7 C) (Oral)  Resp 13  SpO2 99%  Physical Exam  Nursing note and vitals reviewed. Constitutional: She is oriented to person, place, and time. She appears well-developed and well-nourished. No distress.  HENT:  Head: Normocephalic and atraumatic.  Eyes:       Normal appearance  Neck: Normal range of motion and full passive range of motion without pain. Neck supple. No JVD present. Carotid bruit is not present.  Cardiovascular: Normal rate, regular rhythm, normal heart sounds and intact distal pulses.   Pulmonary/Chest: Effort normal and breath sounds normal. No respiratory distress. She exhibits no tenderness.  Abdominal: Soft. Bowel sounds are normal. She exhibits no distension. There is no tenderness. There is no guarding.  Musculoskeletal: Normal range of motion. She exhibits no edema and no tenderness.       2+ Dorsalis Pedis pulses  Neurological:  She is alert and oriented to person, place, and time. No cranial nerve deficit. Coordination normal.  Skin: Skin is warm and dry. No rash noted. She is not diaphoretic.  Psychiatric: She has a normal mood and affect. Her behavior is normal.   EKG: normal EKG, normal sinus rhythm, unchanged from previous tracings.   ED Course  Procedures (including critical care time)   Labs Reviewed  CBC  DIFFERENTIAL  TROPONIN I  CK TOTAL AND CKMB  COMPREHENSIVE METABOLIC PANEL   4:78 PM Spoke with [facility], who will come and see patient in the emergency department.  The attending physician requested I order a CT of the patient's head. She explained the pt had a mini stroke after her stent placement & wants to make sure this has not happened again. Pt has been re-evaluated and questioned about her medical history. She states that today's episode of chest pain and tingling feels like angina and not stroke type symptoms.   [facility] coming to see pt in ED.   4:23 PM Pt to be admitted by Dr. [place] [facility], Telemetry Bed, Chest Pain    MDM  Chest Pain         [place], [place] 07/04/11 1624

## 2011-07-04 NOTE — ED Notes (Signed)
Pt returned from x-ray, placed back on the cardiac monitor. Vital signs stable. Family remains at the bedside, no acute distress noted.

## 2011-07-05 ENCOUNTER — Inpatient Hospital Stay (HOSPITAL_COMMUNITY): Payer: BC Managed Care – PPO

## 2011-07-05 ENCOUNTER — Other Ambulatory Visit: Payer: Self-pay | Admitting: Interventional Cardiology

## 2011-07-05 ENCOUNTER — Other Ambulatory Visit: Payer: Self-pay

## 2011-07-05 LAB — CBC
HCT: 39.2 % (ref 36.0–46.0)
Hemoglobin: 13.6 g/dL (ref 12.0–15.0)
WBC: 7.9 10*3/uL (ref 4.0–10.5)

## 2011-07-05 LAB — HEPARIN LEVEL (UNFRACTIONATED)
Heparin Unfractionated: 0.18 IU/mL — ABNORMAL LOW (ref 0.30–0.70)
Heparin Unfractionated: 0.51 IU/mL (ref 0.30–0.70)

## 2011-07-05 LAB — CARDIAC PANEL(CRET KIN+CKTOT+MB+TROPI): Total CK: 48 U/L (ref 7–177)

## 2011-07-05 MED ORDER — TECHNETIUM TC 99M TETROFOSMIN IV KIT
30.0000 | PACK | Freq: Once | INTRAVENOUS | Status: AC | PRN
  Administered 2011-07-05: 30 via INTRAVENOUS

## 2011-07-05 MED ORDER — REGADENOSON 0.4 MG/5ML IV SOLN
0.4000 mg | Freq: Once | INTRAVENOUS | Status: AC
  Administered 2011-07-05: 0.4 mg via INTRAVENOUS
  Filled 2011-07-05: qty 5

## 2011-07-05 MED ORDER — TECHNETIUM TC 99M TETROFOSMIN IV KIT
10.0000 | PACK | Freq: Once | INTRAVENOUS | Status: AC | PRN
Start: 2011-07-05 — End: 2011-07-05
  Administered 2011-07-05: 10 via INTRAVENOUS

## 2011-07-05 MED ORDER — NITROGLYCERIN 2 % TD OINT
1.0000 [in_us] | TOPICAL_OINTMENT | TRANSDERMAL | Status: DC
  Filled 2011-07-05: qty 30

## 2011-07-05 NOTE — Progress Notes (Signed)
I discharged Mrs. Beth Christian. She is to return on Friday at 6AM for a cardiac cath. She received information on current medication list, and daily medication list. She also received a handout on community resources. She verbalized understanding of discharge instructions, and had no questions. Discharge was successful.

## 2011-07-05 NOTE — Progress Notes (Addendum)
ANTICOAGULATION CONSULT NOTE - Initial Consult  Pharmacy Consult for Heparin Indication: chest pain/ACS  No Known Allergies  Patient Measurements: Height: 5\' 3"  (160 cm) Weight: 152 lb 1.9 oz (69 kg) IBW/kg (Calculated) : 52.4  Heparin dosing weight: 66.6kg  Vital Signs: Temp: 98.1 F (36.7 C) (11/20 2128) Temp src: Oral (11/20 2128) BP: 138/72 mmHg (11/20 2128) Pulse Rate: 70  (11/20 2128)  Labs:  Basename 07/04/11 2337 07/04/11 1924 07/04/11 1658 07/04/11 1646 07/04/11 1423 07/04/11 1422  HGB -- -- -- -- -- 14.6  HCT -- -- -- -- -- 41.9  PLT -- -- -- -- -- 245  APTT -- -- -- -- -- --  LABPROT -- -- 13.5 -- -- --  INR -- -- 1.01 -- -- --  HEPARINUNFRC 0.51 -- -- -- -- --  CREATININE -- 0.86 -- -- -- 0.91  CKTOTAL 48 -- -- 53 58 --  CKMB 2.2 -- -- 2.4 2.6 --  TROPONINI <0.30 -- -- <0.30 <0.30 --   Estimated Creatinine Clearance: 64.8 ml/min (by C-G formula based on Cr of 0.86).  Medical History: Past Medical History  Diagnosis Date  . Coronary artery disease     s/p RCA DES   . Depression   . Hypothyroidism   . Tinnitus of both ears   . DJD (degenerative joint disease)   . Urinary, incontinence, stress female   . Acrocyanosis   . CVA (cerebral infarction)     occured during heart cath    Assessment: 60yof with DES to RCA to begin heparin for chest pain. 1st heparin level 0.51.  Goal of Therapy:  Heparin level 0.3-0.7 units/ml   Plan:  Continue Heparin at 800 units/hr and f/u with a second level at 0800 1Earl, Theressa Millard 07/05/2011,12:40 AM

## 2011-07-05 NOTE — Discharge Summary (Signed)
Patient ID: TALISA PETRAK MRN: [id number] DOB/AGE: 12/06/1950 60 y.o.  Admit date: 07/04/2011 Discharge date: 07/05/2011  Primary Discharge Diagnosis: Chest pain without myocardial infarction  Secondary Discharge Diagnosis:1. Coronary atherosclerotic heart disease with proximal RCA DES June 2012  2. 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 Hyperlipidemia  4. History of depression  5. Hypothyroidism  Significant Diagnostic Studies: Pharmacologic myocardial perfusion study possibly suggesting inferoseptal ischemia on 07/05/2011  Consults: none  Hospital Course: The patient began having recurring left chest and arm numbness similar to angina that occurred in June prior to right coronary stenting. She was instructed to come to the emergency room by my office staff. The discomfort has been exertional. It goes away with rest. The EKGs and markers were negative for infarction. A pharmacologic nuclear study performed on 07/05/2011 was remarkable for septal inferior wall ischemia. No high risk features were noted.  Following the stress test on no new medications, the patient was able to ambulate without difficulty. No chest discomfort. Because of the timing we have decided to discharge the patient home for Thanksgiving and have her report back to the hospital was 6 AM on Friday morning for diagnostic coronary angiography to define anatomy and determine if there is stent restenosis or new disease/progression. I discussed the other treatment options with the patient including continued hospitalization until Friday when the catheterization could be performed. We all agreed that this approach would be most practical.   Discharge Exam: Blood pressure 129/65, pulse 62, temperature 97.9 F (36.6 C), temperature source Oral, resp. rate 17, height 5\' 3"  (1.6 m), weight 69 kg (152 lb 1.9 oz), SpO2 95.00%.  No significant abnormalities are noted on exam. The patient does have double vision much  much less than noted at the time of her acute stroke in June. Cardiac exam is unremarkable. Labs:   Lab Results  Component Value Date   WBC 7.9 07/05/2011   HGB 13.6 07/05/2011   HCT 39.2 07/05/2011   MCV 90.3 07/05/2011   PLT 246 07/05/2011    Lab 07/04/11 1924  NA 138  K 3.6  CL 100  CO2 29  BUN 16  CREATININE 0.86  CALCIUM 8.8  PROT 6.5  BILITOT 0.2*  ALKPHOS 94  ALT 6  AST 21  GLUCOSE 146*   Lab Results  Component Value Date   CKTOTAL 48 07/04/2011   CKMB 2.2 07/04/2011   TROPONINI <0.30 07/04/2011    Lab Results  Component Value Date   CHOL 196 01/26/2011   Lab Results  Component Value Date   HDL 58 01/26/2011   Lab Results  Component Value Date   LDLCALC 120* 01/26/2011   Lab Results  Component Value Date   TRIG 88 01/26/2011   Lab Results  Component Value Date   CHOLHDL 3.4 01/26/2011   No results found for this basename: LDLDIRECT      Radiology: No acute abnormality EKG: Normal  FOLLOW UP PLANS AND APPOINTMENTS  Current Discharge Medication List    CONTINUE these medications which have NOT CHANGED   Details  aspirin 325 MG EC tablet Take 325 mg by mouth daily.     buPROPion (WELLBUTRIN XL) 150 MG 24 hr tablet Take 150 mg by mouth 3 (three) times daily.      CALCIUM-VITAMIN D PO Take 1 tablet by mouth daily.      Cholecalciferol (VITAMIN D-3 PO) Take 1 tablet by mouth daily.      citalopram (CELEXA) 20 MG tablet  Take 20 mg by mouth daily.      clonazePAM (KLONOPIN) 0.5 MG tablet Take 0.5 mg by mouth at bedtime as needed. For sleep     clopidogrel (PLAVIX) 75 MG tablet Take 75 mg by mouth daily.      Cyanocobalamin (VITAMIN B-12 PO) Take 1 tablet by mouth daily.      fish oil-omega-3 fatty acids 1000 MG capsule Take 1 g by mouth daily.      levothyroxine (SYNTHROID, LEVOTHROID) 75 MCG tablet Take 75 mcg by mouth daily.      Multiple Vitamins-Minerals (MULTIVITAMINS THER. W/MINERALS) TABS Take 1 tablet by mouth daily.        rosuvastatin (CRESTOR) 20 MG tablet Take 20 mg by mouth daily.      VITAMIN E PO Take 1 tablet by mouth daily.      nitroGLYCERIN (NITROSTAT) 0.4 MG SL tablet Place 0.4 mg under the tongue every 5 (five) minutes as needed. For chest pain        Follow-up Information    Follow up on 07/07/2011. (Return at 6 am for heart cath. Report to the Short stay area)          BRING ALL MEDICATIONS WITH YOU TO FOLLOW UP APPOINTMENTS  Time spent with patient to include physician time:30 min Signed: [name] 07/05/2011, 6:02 PM

## 2011-07-07 ENCOUNTER — Ambulatory Visit (HOSPITAL_COMMUNITY): Admit: 2011-07-07 | Payer: Self-pay | Admitting: Interventional Cardiology

## 2011-07-07 ENCOUNTER — Other Ambulatory Visit: Payer: Self-pay

## 2011-07-07 ENCOUNTER — Encounter (HOSPITAL_COMMUNITY): Payer: Self-pay

## 2011-07-07 ENCOUNTER — Ambulatory Visit (HOSPITAL_COMMUNITY)
Admission: RE | Admit: 2011-07-07 | Discharge: 2011-07-08 | Disposition: A | Discharge status: Home / Self Care | Payer: BC Managed Care – PPO | Source: Ambulatory Visit | Attending: Interventional Cardiology | Admitting: Interventional Cardiology

## 2011-07-07 ENCOUNTER — Encounter (HOSPITAL_COMMUNITY)
Admission: RE | Disposition: A | Discharge status: Home / Self Care | Payer: Self-pay | Source: Ambulatory Visit | Attending: Interventional Cardiology

## 2011-07-07 DIAGNOSIS — I209 Angina pectoris, unspecified: Secondary | ICD-10-CM | POA: Insufficient documentation

## 2011-07-07 DIAGNOSIS — F3289 Other specified depressive episodes: Secondary | ICD-10-CM | POA: Insufficient documentation

## 2011-07-07 DIAGNOSIS — E785 Hyperlipidemia, unspecified: Secondary | ICD-10-CM | POA: Insufficient documentation

## 2011-07-07 DIAGNOSIS — I2510 Atherosclerotic heart disease of native coronary artery without angina pectoris: Secondary | ICD-10-CM | POA: Insufficient documentation

## 2011-07-07 DIAGNOSIS — F329 Major depressive disorder, single episode, unspecified: Secondary | ICD-10-CM | POA: Insufficient documentation

## 2011-07-07 DIAGNOSIS — Z8673 Personal history of transient ischemic attack (TIA), and cerebral infarction without residual deficits: Secondary | ICD-10-CM | POA: Insufficient documentation

## 2011-07-07 HISTORY — PX: LEFT HEART CATHETERIZATION WITH CORONARY ANGIOGRAM: SHX5451

## 2011-07-07 HISTORY — PX: PERCUTANEOUS CORONARY STENT INTERVENTION (PCI-S): SHX5485

## 2011-07-07 SURGERY — LEFT HEART CATHETERIZATION WITH CORONARY ANGIOGRAM
Anesthesia: LOCAL

## 2011-07-07 SURGERY — LEFT HEART CATHETERIZATION WITH CORONARY ANGIOGRAM
Anesthesia: Moderate Sedation

## 2011-07-07 MED ORDER — ASPIRIN 81 MG PO CHEW
324.0000 mg | CHEWABLE_TABLET | ORAL | Status: AC
  Administered 2011-07-07: 324 mg via ORAL

## 2011-07-07 MED ORDER — THERA M PLUS PO TABS
1.0000 | ORAL_TABLET | Freq: Every day | ORAL | Status: DC
  Filled 2011-07-07 (×2): qty 1

## 2011-07-07 MED ORDER — SODIUM CHLORIDE 0.9 % IJ SOLN: 3.0000 mL | Freq: Two times a day (BID) | INTRAMUSCULAR | Status: DC

## 2011-07-07 MED ORDER — CLONAZEPAM 0.5 MG PO TABS
0.5000 mg | ORAL_TABLET | Freq: Every day | ORAL | Status: DC
  Administered 2011-07-07: 0.5 mg via ORAL
  Filled 2011-07-07: qty 1

## 2011-07-07 MED ORDER — MIDAZOLAM HCL 2 MG/2ML IJ SOLN
INTRAMUSCULAR | Status: AC
  Filled 2011-07-07: qty 2

## 2011-07-07 MED ORDER — CLOPIDOGREL BISULFATE 75 MG PO TABS: 75.0000 mg | ORAL_TABLET | Freq: Every day | ORAL | Status: DC

## 2011-07-07 MED ORDER — NITROGLYCERIN 0.2 MG/ML ON CALL CATH LAB
INTRAVENOUS | Status: AC
  Filled 2011-07-07: qty 1

## 2011-07-07 MED ORDER — FENTANYL CITRATE 0.05 MG/ML IJ SOLN
INTRAMUSCULAR | Status: AC
  Filled 2011-07-07: qty 2

## 2011-07-07 MED ORDER — ASPIRIN EC 325 MG PO TBEC: 325.0000 mg | DELAYED_RELEASE_TABLET | Freq: Every day | ORAL | Status: DC

## 2011-07-07 MED ORDER — LIDOCAINE HCL (PF) 1 % IJ SOLN
INTRAMUSCULAR | Status: AC
  Filled 2011-07-07: qty 30

## 2011-07-07 MED ORDER — CLOPIDOGREL BISULFATE 75 MG PO TABS
150.0000 mg | ORAL_TABLET | ORAL | Status: DC
  Filled 2011-07-07: qty 1

## 2011-07-07 MED ORDER — CITALOPRAM HYDROBROMIDE 20 MG PO TABS
20.0000 mg | ORAL_TABLET | Freq: Every day | ORAL | Status: DC
  Filled 2011-07-07 (×2): qty 1

## 2011-07-07 MED ORDER — CLOPIDOGREL BISULFATE 75 MG PO TABS
ORAL_TABLET | ORAL | Status: AC
  Filled 2011-07-07: qty 1

## 2011-07-07 MED ORDER — VITAMIN D-3 125 MCG (5000 UT) PO TABS: ORAL_TABLET | ORAL | Status: DC

## 2011-07-07 MED ORDER — VITAMIN D3 25 MCG (1000 UNIT) PO TABS
1000.0000 [IU] | ORAL_TABLET | Freq: Every day | ORAL | Status: DC
  Filled 2011-07-07 (×2): qty 1

## 2011-07-07 MED ORDER — ROSUVASTATIN CALCIUM 20 MG PO TABS
20.0000 mg | ORAL_TABLET | Freq: Every day | ORAL | Status: DC
  Filled 2011-07-07 (×2): qty 1

## 2011-07-07 MED ORDER — SODIUM CHLORIDE 0.9 % IV SOLN
250.0000 mL | INTRAVENOUS | Status: DC
Start: 2011-07-07 — End: 2011-07-07

## 2011-07-07 MED ORDER — HEPARIN (PORCINE) IN NACL 2-0.9 UNIT/ML-% IJ SOLN
INTRAMUSCULAR | Status: AC
  Filled 2011-07-07: qty 2000

## 2011-07-07 MED ORDER — CLOPIDOGREL BISULFATE 75 MG PO TABS
150.0000 mg | ORAL_TABLET | ORAL | Status: AC
  Administered 2011-07-07: 150 mg via ORAL

## 2011-07-07 MED ORDER — BIVALIRUDIN 250 MG IV SOLR
INTRAVENOUS | Status: AC
  Filled 2011-07-07: qty 250

## 2011-07-07 MED ORDER — CALCIUM-VITAMIN D 250-125 MG-UNIT PO TABS: 1.0000 | ORAL_TABLET | Freq: Every day | ORAL | Status: DC

## 2011-07-07 MED ORDER — CALCIUM CARBONATE-VITAMIN D 500-200 MG-UNIT PO TABS
0.5000 | ORAL_TABLET | Freq: Every day | ORAL | Status: DC
  Filled 2011-07-07 (×2): qty 0.5

## 2011-07-07 MED ORDER — MORPHINE SULFATE 2 MG/ML IJ SOLN: 2.0000 mg | INTRAMUSCULAR | Status: DC | PRN

## 2011-07-07 MED ORDER — NITROGLYCERIN 0.4 MG SL SUBL: 0.4000 mg | SUBLINGUAL_TABLET | SUBLINGUAL | Status: DC | PRN

## 2011-07-07 MED ORDER — SODIUM CHLORIDE 0.9 % IJ SOLN: 3.0000 mL | INTRAMUSCULAR | Status: DC | PRN

## 2011-07-07 MED ORDER — ASPIRIN 81 MG PO CHEW
324.0000 mg | CHEWABLE_TABLET | ORAL | Status: DC
  Filled 2011-07-07: qty 4

## 2011-07-07 MED ORDER — OXYCODONE-ACETAMINOPHEN 5-325 MG PO TABS: 1.0000 | ORAL_TABLET | ORAL | Status: DC | PRN

## 2011-07-07 MED ORDER — SODIUM CHLORIDE 0.9 % IV SOLN
INTRAVENOUS | Status: DC
  Administered 2011-07-07: 07:00:00 via INTRAVENOUS

## 2011-07-07 MED ORDER — ACETAMINOPHEN 325 MG PO TABS: 650.0000 mg | ORAL_TABLET | ORAL | Status: DC | PRN

## 2011-07-07 MED ORDER — LEVOTHYROXINE SODIUM 75 MCG PO TABS
75.0000 ug | ORAL_TABLET | Freq: Every day | ORAL | Status: DC
  Filled 2011-07-07 (×2): qty 1

## 2011-07-07 MED ORDER — SODIUM CHLORIDE 0.9 % IV SOLN: INTRAVENOUS | Status: AC

## 2011-07-07 MED ORDER — BUPROPION HCL ER (XL) 150 MG PO TB24
150.0000 mg | ORAL_TABLET | Freq: Three times a day (TID) | ORAL | Status: DC
  Administered 2011-07-07: 150 mg via ORAL
  Filled 2011-07-07 (×6): qty 1

## 2011-07-07 MED ORDER — VITAMIN E 45 MG (100 UNIT) PO CAPS
100.0000 [IU] | ORAL_CAPSULE | Freq: Every day | ORAL | Status: DC
  Filled 2011-07-07 (×2): qty 1

## 2011-07-07 MED ORDER — SODIUM CHLORIDE 0.9 % IV SOLN: INTRAVENOUS | Status: DC

## 2011-07-07 MED ORDER — ONDANSETRON HCL 4 MG/2ML IJ SOLN: 4.0000 mg | Freq: Four times a day (QID) | INTRAMUSCULAR | Status: DC | PRN

## 2011-07-07 NOTE — H&P (Signed)
  Refer to the admission H and P of 07/04/11.

## 2011-07-07 NOTE — Interval H&P Note (Signed)
History and Physical Interval Note:   07/07/2011   9:31 AM   Beth Christian  has presented today for surgery, with the diagnosis of Angina pectoris with exertion  The various methods of treatment have been discussed with the patient and family. After consideration of risks, benefits and other options for treatment, the patient has consented to  Procedure(s): LEFT HEART CATHETERIZATION WITH CORONARY ANGIOGRAM PERCUTANEOUS CORONARY STENT INTERVENTION (PCI-S) as a surgical intervention .  The patients' history has been reviewed, patient examined, no change in status, stable for surgery.  I have reviewed the patients' chart and labs.  Questions were answered to the patient's satisfaction.     [name]  MD

## 2011-07-07 NOTE — Progress Notes (Signed)
Reviewed heart healthy diet and risk  factors with patient will f/u with patient tomorrow.  Gladstone Lighter RN [phone].

## 2011-07-07 NOTE — Op Note (Signed)
PROCEDURE:  Left heart catheterization with selective coronary angiography, left ventriculogram, and PCI of the right coronary for restenosis  INDICATIONS:  Abnormal nuclear study demonstrating inferior ischemia. History of DES stent placement proximal right coronary in June of 2012. The studies being done to rule out restenosis and treat if needed.  The risks, benefits, and details of the procedure were explained to the patient.  The patient verbalized understanding and wanted to proceed.  Informed written consent was obtained.  PROCEDURE TECHNIQUE:  After Xylocaine anesthesia a 5 French sheath sheath was placed in the right femoral artery with a single anterior needle wall stick.   Coronary angiography was done using a 5 [place] multipurpose and JR 4 catheter.  Left ventriculography was done using a 5 [place] multipurpose catheter.   After review of the digital images, it became obvious that ISR in the proximal portion of the right coronary stent was was the source of ischemia and symptoms. We upgraded to a 6 [place] sheath. Angiomax bolus and infusion was started. ACT was documented to be greater than 300. 150 mg of Plavix had been received in preop. A BMW wire and a 6 [place] JR 4 guide catheter were used. The perform intravascular ultrasound after predilatation with a 3.0 mm balloon. We then deployed after properly positioning a 3.5 x 15 mm resolute integrity stent to 12 atmospheres. We then performed intravascular ultrasound which demonstrated good stent apposition. Expansion was still less than desired. We post dilated to high pressure with a 3.5 x 15 mm not compliant balloon to 14 atmospheres. 2 balloon inflations were performed. The case was terminated.  And a seal was used for hemostasis   CONTRAST:  Total of 110  cc.  COMPLICATIONS:  None.    HEMODYNAMICS:  Aortic pressure was 110/65 mmHg; LV pressure was 110/5 mmHg; LVEDP 7 mm mercury.  There was no gradient between the left ventricle and  aorta.    ANGIOGRAPHIC DATA:   The left main coronary artery is patent with perhaps 20% narrowing.  The left anterior descending artery is patent with diffuse vasoconstriction. 50% mid vessel stenosis..  The left circumflex artery is small territory but widely patent.  The right coronary artery is large vessel with PDA containing 80% stenosis and proximal focal in-stent restenosis of 95%  Percutaneous transluminal coronary intervention results: The 95% ISR lesion was reduced to 0% with TIMI grade 3 flow. 0% stenosis was present at completion.Marland Kitchen  LEFT VENTRICULOGRAM:  Left ventricular angiogram was done in the 30 RAO projection and revealed normal left ventricular wall motion and systolic function with an estimated ejection fraction of 65%.  LVEDP was 7 mmHg.  IMPRESSIONS:  1. Severe in-stent restenosis proximal RCA drug-eluting stent. 80% stenosis in the distal right coronary involving the mid PDA.   2. No change in left coronary since June 2012. Mild to moderate mid LAD disease is noted.  3. Normal left ventricular function  4. Successful PCI on the right coronary with reduction of the ISR stenosis from 99% to 0% following recently and with a Resolute Integrity DES.  5. Successful Angio-Seal  RECOMMENDATION:  We will continue Plavix. Patient will be discharged 07/08/2011.Marland Kitchen

## 2011-07-07 NOTE — H&P (Signed)
  The risks and benefits of PCI were discussed in detail with the patient.  Risks including death, heart attack, stroke, bleeding, limb ischemia, emergency bypass surgery, kidney failure and allergy were discussed and accepted by the patient.   The H and P was reviewed, and the pateint reexamined before the procedure. No interval changes were noted.

## 2011-07-08 ENCOUNTER — Other Ambulatory Visit: Payer: Self-pay

## 2011-07-08 LAB — BASIC METABOLIC PANEL
Calcium: 8.5 mg/dL (ref 8.4–10.5)
Chloride: 103 mEq/L (ref 96–112)
Creatinine, Ser: 0.94 mg/dL (ref 0.50–1.10)
GFR calc Af Amer: 75 mL/min — ABNORMAL LOW (ref >90)
Sodium: 143 mEq/L (ref 135–145)

## 2011-07-08 LAB — CBC
MCV: 90.5 fL (ref 78.0–100.0)
Platelets: 229 10*3/uL (ref 150–400)
RDW: 12.2 % (ref 11.5–15.5)
WBC: 6.3 10*3/uL (ref 4.0–10.5)

## 2011-07-08 NOTE — Discharge Summary (Signed)
Patient ID: Beth Christian MRN: [id number] DOB/AGE: 08/16/1950 60 y.o.  Admit date: 07/07/2011 Discharge date: 07/08/2011  Primary Discharge Diagnosis: 1. Recurrent angina due to the right coronary artery stent restenosis Secondary Discharge Diagnosis: 1. Coronary atherosclerotic heart disease with drug-eluting stent implantation June 2012. Recent recurrence of anginal symptoms and associated abnormal nuclear study with inferior ischemia.  2. History of depression  3. Hyperlipidemia  4. Embolic stroke during catheterization June 2012  Significant Diagnostic Studies: Coronary angiography and drug-eluting stent implantation for restenosis, right coronary artery, 07/07/2011.  Consults: none  Hospital Course: The patient came in as a day patient to short stay. She had been discharged from the hospital on November 21 after presenting with chest pain and undergoing nuclear scintigraphy which demonstrated inferior ischemia.  Diagnostic catheterization was performed via the right femoral approach. Restenosis was identified and treated with restenting using a resolute integrity drug-eluting stent within the previously placed stent in the proximal RCA. The final balloon inflation diameter was 3.5 mm.  No complications occurred overnight and the patient was felt safe for discharge.   Discharge Exam: Blood pressure 113/54, pulse 68, temperature 98.1 F (36.7 C), temperature source Oral, resp. rate 18, height 5\' 3"  (1.6 m), weight 64.5 kg (142 lb 3.2 oz), SpO2 95.00%.   The right groin cath site is unremarkable. Labs:   Lab Results  Component Value Date   WBC 6.3 07/08/2011   HGB 13.2 07/08/2011   HCT 38.0 07/08/2011   MCV 90.5 07/08/2011   PLT 229 07/08/2011    Lab 07/08/11 0530 07/04/11 1924  NA 143 --  K 4.5 --  CL 103 --  CO2 32 --  BUN 16 --  CREATININE 0.94 --  CALCIUM 8.5 --  PROT -- 6.5  BILITOT -- 0.2*  ALKPHOS -- 94  ALT -- 6  AST -- 21  GLUCOSE 91 --   Lab  Results  Component Value Date   CKTOTAL 48 07/04/2011   CKMB 2.2 07/04/2011   TROPONINI <0.30 07/04/2011    Lab Results  Component Value Date   CHOL 196 01/26/2011   Lab Results  Component Value Date   HDL 58 01/26/2011   Lab Results  Component Value Date   LDLCALC 120* 01/26/2011   Lab Results  Component Value Date   TRIG 88 01/26/2011   Lab Results  Component Value Date   CHOLHDL 3.4 01/26/2011   No results found for this basename: LDLDIRECT      Radiology: No new findings. EKG: Normal.  FOLLOW UP PLANS AND APPOINTMENTS  Current Discharge Medication List    CONTINUE these medications which have NOT CHANGED   Details  aspirin 325 MG EC tablet Take 325 mg by mouth daily.     buPROPion (WELLBUTRIN XL) 150 MG 24 hr tablet Take 150 mg by mouth 3 (three) times daily.      CALCIUM-VITAMIN D PO Take 1 tablet by mouth daily.     Cholecalciferol (VITAMIN D-3 PO) Take 1 tablet by mouth daily.     citalopram (CELEXA) 20 MG tablet Take 20 mg by mouth daily.      clonazePAM (KLONOPIN) 0.5 MG tablet Take 0.5 mg by mouth at bedtime as needed. For sleep     clopidogrel (PLAVIX) 75 MG tablet Take 75 mg by mouth daily.      Cyanocobalamin (VITAMIN B-12 PO) Take 1 tablet by mouth daily.      fish oil-omega-3 fatty acids 1000 MG capsule Take 1 g by mouth  daily.      levothyroxine (SYNTHROID, LEVOTHROID) 75 MCG tablet Take 75 mcg by mouth daily.      Multiple Vitamins-Minerals (MULTIVITAMINS THER. W/MINERALS) TABS Take 1 tablet by mouth daily.      nitroGLYCERIN (NITROSTAT) 0.4 MG SL tablet Place 0.4 mg under the tongue every 5 (five) minutes as needed. For chest pain     rosuvastatin (CRESTOR) 20 MG tablet Take 20 mg by mouth daily.      VITAMIN E PO Take 1 tablet by mouth daily.         Follow-up Information    Follow up with [name] (Keep appointment that is already scheduled)    Contact information:   [address] [place] [address] [facility], [place]. [facility] [place] [address] [phone]          BRING ALL MEDICATIONS WITH YOU TO FOLLOW UP APPOINTMENTS  Time spent with patient to include physician time: 30 minutes. SignedLesleigh Noe 07/08/2011, 7:08 AM

## 2011-07-08 NOTE — Plan of Care (Signed)
Problem: Discharge Progression Outcomes Goal: Vascular site scale level 0 - I Vascular Site Scale Level 0: No bruising/bleeding/hematoma Level I (Mild): Bruising/Ecchymosis, minimal bleeding/ooozing, palpable hematoma < 3 cm Level II (Moderate): Bleeding not affecting hemodynamic parameters, pseudoaneurysm, palpable hematoma > 3 cm  Outcome: Completed/Met Date Met:  07/08/11 Level 1 small area palpated at right groin site,

## 2011-07-08 NOTE — Plan of Care (Signed)
Problem: Consults Goal: PCI Patient Education (See Patient Education module for education specifics.)  Reviewed information with patient and husband regarding PCI and written information given. Good verbalized understanding after questions answered.  Problem: Discharge Progression Outcomes Goal: Vascular site scale level 0 - I Vascular Site Scale Level 0: No bruising/bleeding/hematoma Level I (Mild): Bruising/Ecchymosis, minimal bleeding/ooozing, palpable hematoma < 3 cm Level II (Moderate): Bleeding not affecting hemodynamic parameters, pseudoaneurysm, palpable hematoma > 3 cm  Level 1 small firm area palpated at right groin site.

## 2011-07-08 NOTE — Progress Notes (Signed)
CARDIAC REHAB PHASE I   PRE:  Rate/Rhythm: 64 SR  BP:  Supine:   Sitting: 125/60  Standing:    SaO2:   MODE:  Ambulation: 500 ft   POST:  Rate/Rhythem: 76 SR  BP:  Supine:   Sitting: 130/63  Standing:    SaO2:   Cliffie Gingras Taylor  Pt ambulated 500 ft assist x 1 without complaints. Tolerated well. No angina, dyspnea, or dizziness. Pt reported dizziness in the past with ambulation and other activities but did not experience any this morning. Pt returned to bedside. Vital signs stable. Discussed pt returning to [place] phase II cardiac rehab with pt and spouse. Pt seems hesitant to join phase II due to transportation issues. I did encourage pt to attend phase II at [facility] but advised pt to speak with physician if she has reservations. Pt voiced understanding. Will send referral. Harriett Sine MS 07/08/11 4098

## 2011-07-10 MED FILL — Dextrose Inj 5%: INTRAVENOUS | Qty: 50 | Status: AC

## 2012-03-24 ENCOUNTER — Emergency Department (HOSPITAL_COMMUNITY)
Admission: EM | Admit: 2012-03-24 | Discharge: 2012-03-25 | Disposition: A | Discharge status: Home / Self Care | Payer: BC Managed Care – PPO | Attending: Emergency Medicine | Admitting: Emergency Medicine

## 2012-03-24 ENCOUNTER — Encounter (HOSPITAL_COMMUNITY): Payer: Self-pay | Admitting: Emergency Medicine

## 2012-03-24 DIAGNOSIS — IMO0002 Reserved for concepts with insufficient information to code with codable children: Secondary | ICD-10-CM

## 2012-03-24 DIAGNOSIS — E039 Hypothyroidism, unspecified: Secondary | ICD-10-CM | POA: Insufficient documentation

## 2012-03-24 DIAGNOSIS — Z8673 Personal history of transient ischemic attack (TIA), and cerebral infarction without residual deficits: Secondary | ICD-10-CM | POA: Insufficient documentation

## 2012-03-24 DIAGNOSIS — Y998 Other external cause status: Secondary | ICD-10-CM | POA: Insufficient documentation

## 2012-03-24 DIAGNOSIS — Z87891 Personal history of nicotine dependence: Secondary | ICD-10-CM | POA: Insufficient documentation

## 2012-03-24 DIAGNOSIS — S61209A Unspecified open wound of unspecified finger without damage to nail, initial encounter: Secondary | ICD-10-CM | POA: Insufficient documentation

## 2012-03-24 DIAGNOSIS — W261XXA Contact with sword or dagger, initial encounter: Secondary | ICD-10-CM | POA: Insufficient documentation

## 2012-03-24 DIAGNOSIS — Y93G1 Activity, food preparation and clean up: Secondary | ICD-10-CM | POA: Insufficient documentation

## 2012-03-24 DIAGNOSIS — W260XXA Contact with knife, initial encounter: Secondary | ICD-10-CM | POA: Insufficient documentation

## 2012-03-24 DIAGNOSIS — M1990 Unspecified osteoarthritis, unspecified site: Secondary | ICD-10-CM | POA: Insufficient documentation

## 2012-03-24 DIAGNOSIS — Z7902 Long term (current) use of antithrombotics/antiplatelets: Secondary | ICD-10-CM | POA: Insufficient documentation

## 2012-03-24 DIAGNOSIS — I2510 Atherosclerotic heart disease of native coronary artery without angina pectoris: Secondary | ICD-10-CM | POA: Insufficient documentation

## 2012-03-24 MED ORDER — LIDOCAINE-EPINEPHRINE-TETRACAINE (LET) SOLUTION
3.0000 mL | Freq: Once | NASAL | Status: AC
  Administered 2012-03-25: 3 mL via TOPICAL
  Filled 2012-03-24 (×2): qty 3

## 2012-03-24 NOTE — ED Notes (Deleted)
Pt states he dropped a pallet on his L foot while working on Monday.  C/o pain and swelling to L foot.

## 2012-03-24 NOTE — ED Notes (Addendum)
Pt states she cut R thumb while cutting vegetables at 1pm today. C-shaped laceration to anterior R thumb- actively bleeding on arrival. Bandage applied at triage.

## 2012-03-25 ENCOUNTER — Ambulatory Visit: Payer: Self-pay | Admitting: Otolaryngology

## 2012-03-25 NOTE — ED Provider Notes (Signed)
Medical screening examination/treatment/procedure(s) were performed by non-physician practitioner and as supervising physician I was immediately available for consultation/collaboration.  Stevee Valenta, MD 03/25/12 0708 

## 2012-03-25 NOTE — ED Provider Notes (Signed)
History     CSN: [id number]  Arrival date & time 03/24/12  2056   First MD Initiated Contact with Patient 03/24/12 2325      Chief Complaint  Patient presents with  . Extremity Laceration    (Consider location/radiation/quality/duration/timing/severity/associated sxs/prior treatment) HPI Comments: Patient presents emergency department chief complaint laceration.  Onset injury occurred around 1 o'clock in the afternoon on August 11 while cutting vegetables.  Location of laceration is fat pad of right thumb. Patient states that she's taken Plavix and the bleeding has not stopped.  Patient denies any fevers, lightheadedness or dizziness.  Tetanus status is up-to-date.   Past Medical History  Diagnosis Date  . Coronary artery disease     s/p RCA DES   . Depression   . Hypothyroidism   . Tinnitus of both ears   . DJD (degenerative joint disease)   . Urinary, incontinence, stress female   . Acrocyanosis   . CVA (cerebral infarction)     occured during heart cath    Past Surgical History  Procedure Date  . Thumb srugery 2004  . Coronary angioplasty with stent placement 2012  . Chest tube for pneumonthorax after rib fracture     Family History  Problem Relation Age of Onset  . Hypertension Mother   . Kidney failure Father     deceased  . Coronary artery disease Father     History  Substance Use Topics  . Smoking status: Former Smoker -- 0.5 packs/day for 10 years    Quit date: 07/07/1979  . Smokeless tobacco: Never Used  . Alcohol Use: 1.0 oz/week    2 drink(s) per week    OB History    Grav Para Term Preterm Abortions TAB SAB Ect Mult Living                  Review of Systems  Constitutional: Negative for fever, chills and appetite change.  HENT: Negative for congestion.   Eyes: Negative for visual disturbance.  Respiratory: Negative for shortness of breath.   Cardiovascular: Negative for chest pain and leg swelling.  Gastrointestinal: Negative for  abdominal pain.  Genitourinary: Negative for dysuria, urgency and frequency.  Skin: Positive for wound.  Neurological: Negative for dizziness, syncope, weakness, light-headedness, numbness and headaches.  Psychiatric/Behavioral: Negative for confusion.  All other systems reviewed and are negative.    Allergies  Review of patient's allergies indicates no known allergies.  Home Medications   Current Outpatient Rx  Name Route Sig Dispense Refill  . ASPIRIN 325 MG PO TBEC Oral Take 325 mg by mouth daily.     . BUPROPION HCL ER (XL) 150 MG PO TB24 Oral Take 150 mg by mouth 3 (three) times daily.     Marland Kitchen CALCIUM-VITAMIN D PO Oral Take 1 tablet by mouth daily.     Marland Kitchen VITAMIN D-3 PO Oral Take 1 tablet by mouth daily.     Marland Kitchen CITALOPRAM HYDROBROMIDE 20 MG PO TABS Oral Take 20 mg by mouth daily.      Marland Kitchen CLONAZEPAM 0.5 MG PO TABS Oral Take 0.5 mg by mouth 3 (three) times daily as needed. For sleep    . VITAMIN B-12 PO Oral Take 1 tablet by mouth daily.      . OMEGA-3 FATTY ACIDS 1000 MG PO CAPS Oral Take 1 g by mouth daily.      Marland Kitchen LEVOTHYROXINE SODIUM 75 MCG PO TABS Oral Take 75 mcg by mouth daily.      Marland Kitchen  THERA M PLUS PO TABS Oral Take 1 tablet by mouth daily.      Marland Kitchen NITROGLYCERIN 0.4 MG SL SUBL Sublingual Place 0.4 mg under the tongue every 5 (five) minutes as needed. For chest pain     . ROSUVASTATIN CALCIUM 20 MG PO TABS Oral Take 10 mg by mouth at bedtime.     Marland Kitchen VITAMIN E PO Oral Take 1 tablet by mouth daily.        BP 147/67  Pulse 73  Temp 97.9 F (36.6 C) (Oral)  Resp 16  SpO2 100%  Physical Exam  Nursing note and vitals reviewed. Constitutional: She is oriented to person, place, and time. She appears well-developed and well-nourished. No distress.  HENT:  Head: Normocephalic and atraumatic.  Eyes: Conjunctivae and EOM are normal.  Neck: Normal range of motion.  Cardiovascular:       Intact distal pulses, capillary refill less than 3 seconds.  Pulmonary/Chest: Effort normal.    Musculoskeletal: Normal range of motion.  Neurological: She is alert and oriented to person, place, and time.  Skin: Skin is warm and dry. No rash noted. She is not diaphoretic.       Superficial laceration of distal right thumb, palmar surface fat pad.    Psychiatric: She has a normal mood and affect. Her behavior is normal.    ED Course  Procedures (including critical care time)  Labs Reviewed - No data to display No results found.   No diagnosis found.    MDM  Laceration  Tdap booster given.Pressure irrigation performed. Laceration occurred > 10 hours ago. Coagulated well with would clot. Pt has no co morbidities to effect normal wound healing. Discussed suture home care w pt and answered questions. Pt to f-u for wound check in 7 days. Return precautions discussed Pt is hemodynamically stable w no complaints prior to dc.           Jaci Carrel, [place] 03/25/12 [phone]

## 2012-06-08 ENCOUNTER — Emergency Department (HOSPITAL_COMMUNITY): Payer: BC Managed Care – PPO

## 2012-06-08 ENCOUNTER — Encounter (HOSPITAL_COMMUNITY): Payer: Self-pay | Admitting: Emergency Medicine

## 2012-06-08 ENCOUNTER — Emergency Department (HOSPITAL_COMMUNITY)
Admission: EM | Admit: 2012-06-08 | Discharge: 2012-06-09 | Disposition: A | Discharge status: Home / Self Care | Payer: BC Managed Care – PPO | Attending: Emergency Medicine | Admitting: Emergency Medicine

## 2012-06-08 DIAGNOSIS — M1990 Unspecified osteoarthritis, unspecified site: Secondary | ICD-10-CM | POA: Insufficient documentation

## 2012-06-08 DIAGNOSIS — Y939 Activity, unspecified: Secondary | ICD-10-CM | POA: Insufficient documentation

## 2012-06-08 DIAGNOSIS — W010XXA Fall on same level from slipping, tripping and stumbling without subsequent striking against object, initial encounter: Secondary | ICD-10-CM | POA: Insufficient documentation

## 2012-06-08 DIAGNOSIS — S52599A Other fractures of lower end of unspecified radius, initial encounter for closed fracture: Secondary | ICD-10-CM | POA: Insufficient documentation

## 2012-06-08 DIAGNOSIS — F3289 Other specified depressive episodes: Secondary | ICD-10-CM | POA: Insufficient documentation

## 2012-06-08 DIAGNOSIS — Z8673 Personal history of transient ischemic attack (TIA), and cerebral infarction without residual deficits: Secondary | ICD-10-CM | POA: Insufficient documentation

## 2012-06-08 DIAGNOSIS — Y92009 Unspecified place in unspecified non-institutional (private) residence as the place of occurrence of the external cause: Secondary | ICD-10-CM | POA: Insufficient documentation

## 2012-06-08 DIAGNOSIS — Z79899 Other long term (current) drug therapy: Secondary | ICD-10-CM | POA: Insufficient documentation

## 2012-06-08 DIAGNOSIS — H9319 Tinnitus, unspecified ear: Secondary | ICD-10-CM | POA: Insufficient documentation

## 2012-06-08 DIAGNOSIS — I2510 Atherosclerotic heart disease of native coronary artery without angina pectoris: Secondary | ICD-10-CM | POA: Insufficient documentation

## 2012-06-08 DIAGNOSIS — Z7982 Long term (current) use of aspirin: Secondary | ICD-10-CM | POA: Insufficient documentation

## 2012-06-08 DIAGNOSIS — S52509A Unspecified fracture of the lower end of unspecified radius, initial encounter for closed fracture: Secondary | ICD-10-CM

## 2012-06-08 DIAGNOSIS — Z87891 Personal history of nicotine dependence: Secondary | ICD-10-CM | POA: Insufficient documentation

## 2012-06-08 DIAGNOSIS — F329 Major depressive disorder, single episode, unspecified: Secondary | ICD-10-CM | POA: Insufficient documentation

## 2012-06-08 DIAGNOSIS — E039 Hypothyroidism, unspecified: Secondary | ICD-10-CM | POA: Insufficient documentation

## 2012-06-08 NOTE — ED Notes (Signed)
Pt reports fall approx one hour ago causing right wrist pain no edema or deformity noted

## 2012-06-09 MED ORDER — HYDROCODONE-ACETAMINOPHEN 5-325 MG PO TABS
1.0000 | ORAL_TABLET | Freq: Once | ORAL | Status: AC
  Administered 2012-06-09: 1 via ORAL
  Filled 2012-06-09: qty 1

## 2012-06-09 MED ORDER — HYDROCODONE-ACETAMINOPHEN 5-325 MG PO TABS: ORAL_TABLET | ORAL | Status: DC

## 2012-06-09 NOTE — ED Provider Notes (Signed)
History     CSN: [id number]  Arrival date & time 06/08/12  2225   First MD Initiated Contact with Patient 06/08/12 2352      Chief Complaint  Patient presents with  . Wrist Pain  . Fall    (Consider location/radiation/quality/duration/timing/severity/associated sxs/prior treatment) HPI Comments: Patient was outside of her house when she tripped on a step and fell onto her right wrist. Patient describes acute onset of pain. Patient denies other injuries. She denies weakness, numbness, or tingling in her right hand and wrist. Patient denies head her head. She denies elbow or shoulder pain. Course is constant. Movement or palpation makes the pain worse. Nothing makes it better.  The history is provided by the patient.    Past Medical History  Diagnosis Date  . Coronary artery disease     s/p RCA DES   . Depression   . Hypothyroidism   . Tinnitus of both ears   . DJD (degenerative joint disease)   . Urinary, incontinence, stress female   . Acrocyanosis   . CVA (cerebral infarction)     occured during heart cath    Past Surgical History  Procedure Date  . Thumb srugery 2004  . Coronary angioplasty with stent placement 2012  . Chest tube for pneumonthorax after rib fracture     Family History  Problem Relation Age of Onset  . Hypertension Mother   . Kidney failure Father     deceased  . Coronary artery disease Father     History  Substance Use Topics  . Smoking status: Former Smoker -- 0.5 packs/day for 10 years    Quit date: 07/07/1979  . Smokeless tobacco: Never Used  . Alcohol Use: 1.0 oz/week    2 drink(s) per week    OB History    Grav Para Term Preterm Abortions TAB SAB Ect Mult Living                  Review of Systems  Constitutional: Negative for activity change.  HENT: Negative for neck pain.   Musculoskeletal: Positive for arthralgias. Negative for back pain and joint swelling.  Skin: Negative for wound.  Neurological: Negative for weakness  and numbness.    Allergies  Review of patient's allergies indicates no known allergies.  Home Medications   Current Outpatient Rx  Name Route Sig Dispense Refill  . ASPIRIN EC 81 MG PO TBEC Oral Take 81 mg by mouth daily.    . BUPROPION HCL ER (XL) 150 MG PO TB24 Oral Take 150 mg by mouth 3 (three) times daily.     Marland Kitchen CALCIUM-VITAMIN D PO Oral Take 1 tablet by mouth daily.     Marland Kitchen VITAMIN D-3 PO Oral Take 1 tablet by mouth daily.     Marland Kitchen CITALOPRAM HYDROBROMIDE 20 MG PO TABS Oral Take 20 mg by mouth daily.      Marland Kitchen CLONAZEPAM 0.5 MG PO TABS Oral Take 0.5 mg by mouth at bedtime as needed. For sleep    . VITAMIN B-12 PO Oral Take 3,000 mcg by mouth daily.     . OMEGA-3 FATTY ACIDS 1000 MG PO CAPS Oral Take 1 g by mouth daily.      Marland Kitchen LEVOTHYROXINE SODIUM 75 MCG PO TABS Oral Take 75 mcg by mouth daily.      Carma Leaven M PLUS PO TABS Oral Take 1 tablet by mouth daily.      Marland Kitchen NITROGLYCERIN 0.4 MG SL SUBL Sublingual Place 0.4 mg under  the tongue every 5 (five) minutes as needed. For chest pain     . ROSUVASTATIN CALCIUM 20 MG PO TABS Oral Take 10 mg by mouth at bedtime.     Marland Kitchen VITAMIN E PO Oral Take 1 tablet by mouth daily.        There were no vitals taken for this visit.  Physical Exam  Nursing note and vitals reviewed. Constitutional: She appears well-developed and well-nourished.  HENT:  Head: Normocephalic and atraumatic.  Eyes: Pupils are equal, round, and reactive to light.  Neck: Normal range of motion. Neck supple.  Cardiovascular: Exam reveals no decreased pulses.   Pulses:      Radial pulses are 2+ on the right side, and 2+ on the left side.  Musculoskeletal: She exhibits tenderness. She exhibits no edema.       Right shoulder: Normal.       Right elbow: Normal.      Right wrist: She exhibits decreased range of motion, tenderness (distal radius), bony tenderness and swelling.       Right hand: She exhibits normal capillary refill. normal sensation noted. Decreased sensation is not  present in the ulnar distribution, is not present in the medial distribution and is not present in the radial distribution. Normal strength noted. She exhibits no finger abduction, no thumb/finger opposition and no wrist extension trouble.  Neurological: She is alert. No sensory deficit.       Motor, sensation, and vascular distal to the injury is fully intact.   Skin: Skin is warm and dry.  Psychiatric: She has a normal mood and affect.    ED Course  Procedures (including critical care time)  Labs Reviewed - No data to display Dg Wrist Complete Right  06/08/2012  *RADIOLOGY REPORT*  Clinical Data: Right wrist pain after fall.  RIGHT WRIST - COMPLETE 3+ VIEW  Comparison: None.  Findings: Comminuted fractures of the distal right radius with fracture lines extending to the radial carpal and radial ulnar joint. No ulnar styloid process fracture.  Mild impaction of the fracture fragments.  Dorsal angulation of the distal fracture fragments.  Soft tissue swelling. There are diffuse degenerative changes.  IMPRESSION: Comminuted fractures of the distal right radial metaphysis.   Original Report Authenticated By: Marlon Pel, M.D.      1. Distal radius fracture     12:03 AM Patient seen and examined. Work-up initiated. Medications ordered. X-ray reviewed with Dr. Oletta Lamas.   Vital signs reviewed and are as follows: Filed Vitals:   06/09/12 0010  BP: 127/69  Pulse: 65  Temp: 97.6 F (36.4 C)  Resp: 18   Patient has seen Dewaine Conger.   Sugar tongue splint/sling ortho tech. Referral given just in case.   Patient counseled on use of narcotic pain medications. Counseled not to combine these medications with others containing tylenol. Urged not to drink alcohol, drive, or perform any other activities that requires focus while taking these medications. The patient verbalizes understanding and agrees with the plan.    MDM  Distal radial fx, slight posterior deformity, no tenting.  Neurovascularly intact. Pain control/ortho f/u indicated.         Renne Crigler, [place] 06/10/12 1941

## 2012-06-09 NOTE — ED Notes (Signed)
Error in charting - splint has not yet been applied

## 2012-06-09 NOTE — Progress Notes (Signed)
Orthopedic Tech Progress Note Patient Details:  Beth Christian 05/22/1951 [id number]  Ortho Devices Type of Ortho Device: Sling immobilizer;Sugartong splint   Haskell Flirt 06/09/2012, 12:11 AM

## 2012-06-10 ENCOUNTER — Encounter (HOSPITAL_BASED_OUTPATIENT_CLINIC_OR_DEPARTMENT_OTHER): Payer: Self-pay | Admitting: *Deleted

## 2012-06-10 NOTE — Progress Notes (Addendum)
Bring all medications. Requested last office notes, and EKG from Dr. Corky Sing office.- from June 2013 visit.Spoke with Dr. Ivin Booty about pt and history - OK for surgery

## 2012-06-10 NOTE — Progress Notes (Signed)
Dr. Ivin Booty reviewed EKG and last office notes from Dr. Verdis Prime - OK for surgery

## 2012-06-11 ENCOUNTER — Ambulatory Visit (HOSPITAL_BASED_OUTPATIENT_CLINIC_OR_DEPARTMENT_OTHER)
Admission: RE | Admit: 2012-06-11 | Discharge: 2012-06-11 | Disposition: A | Discharge status: Home / Self Care | Payer: BC Managed Care – PPO | Source: Ambulatory Visit | Attending: Orthopedic Surgery | Admitting: Orthopedic Surgery

## 2012-06-11 ENCOUNTER — Ambulatory Visit (HOSPITAL_BASED_OUTPATIENT_CLINIC_OR_DEPARTMENT_OTHER): Payer: BC Managed Care – PPO | Admitting: Anesthesiology

## 2012-06-11 ENCOUNTER — Encounter (HOSPITAL_BASED_OUTPATIENT_CLINIC_OR_DEPARTMENT_OTHER): Payer: Self-pay | Admitting: Anesthesiology

## 2012-06-11 ENCOUNTER — Encounter (HOSPITAL_BASED_OUTPATIENT_CLINIC_OR_DEPARTMENT_OTHER): Payer: Self-pay

## 2012-06-11 ENCOUNTER — Other Ambulatory Visit: Payer: Self-pay | Admitting: Orthopedic Surgery

## 2012-06-11 ENCOUNTER — Encounter (HOSPITAL_BASED_OUTPATIENT_CLINIC_OR_DEPARTMENT_OTHER): Payer: Self-pay | Admitting: Certified Registered"

## 2012-06-11 ENCOUNTER — Encounter (HOSPITAL_BASED_OUTPATIENT_CLINIC_OR_DEPARTMENT_OTHER)
Admission: RE | Disposition: A | Discharge status: Home / Self Care | Payer: Self-pay | Source: Ambulatory Visit | Attending: Orthopedic Surgery

## 2012-06-11 DIAGNOSIS — W19XXXA Unspecified fall, initial encounter: Secondary | ICD-10-CM | POA: Insufficient documentation

## 2012-06-11 DIAGNOSIS — I10 Essential (primary) hypertension: Secondary | ICD-10-CM | POA: Insufficient documentation

## 2012-06-11 DIAGNOSIS — S52599A Other fractures of lower end of unspecified radius, initial encounter for closed fracture: Secondary | ICD-10-CM | POA: Insufficient documentation

## 2012-06-11 HISTORY — PX: ORIF DISTAL RADIUS FRACTURE: SUR927

## 2012-06-11 HISTORY — DX: Cerebral infarction, unspecified: I63.9

## 2012-06-11 LAB — POCT I-STAT, CHEM 8
BUN: 14 mg/dL (ref 6–23)
Calcium, Ion: 1.02 mmol/L — ABNORMAL LOW (ref 1.13–1.30)
Chloride: 102 mEq/L (ref 96–112)
Creatinine, Ser: 1 mg/dL (ref 0.50–1.10)
Glucose, Bld: 64 mg/dL — ABNORMAL LOW (ref 70–99)
HCT: 42 % (ref 36.0–46.0)
Hemoglobin: 14.3 g/dL (ref 12.0–15.0)
Potassium: 4.1 mEq/L (ref 3.5–5.1)
Sodium: 142 mEq/L (ref 135–145)
TCO2: 27 mmol/L (ref 0–100)

## 2012-06-11 SURGERY — OPEN REDUCTION INTERNAL FIXATION (ORIF) DISTAL RADIUS FRACTURE
Anesthesia: General | Site: Wrist | Laterality: Right | Wound class: Clean

## 2012-06-11 MED ORDER — OXYCODONE-ACETAMINOPHEN 5-325 MG PO TABS: ORAL_TABLET | ORAL | Status: DC

## 2012-06-11 MED ORDER — ONDANSETRON HCL 4 MG/2ML IJ SOLN
INTRAMUSCULAR | Status: DC | PRN
  Administered 2012-06-11: 4 mg via INTRAVENOUS

## 2012-06-11 MED ORDER — LIDOCAINE HCL (CARDIAC) 20 MG/ML IV SOLN
INTRAVENOUS | Status: DC | PRN
  Administered 2012-06-11: 50 mg via INTRAVENOUS

## 2012-06-11 MED ORDER — ROPIVACAINE HCL 5 MG/ML IJ SOLN
INTRAMUSCULAR | Status: DC | PRN
  Administered 2012-06-11: 20 mL via EPIDURAL

## 2012-06-11 MED ORDER — MIDAZOLAM HCL 2 MG/2ML IJ SOLN
1.0000 mg | INTRAMUSCULAR | Status: DC | PRN
  Administered 2012-06-11: 1 mg via INTRAVENOUS

## 2012-06-11 MED ORDER — PROPOFOL 10 MG/ML IV BOLUS
INTRAVENOUS | Status: DC | PRN
  Administered 2012-06-11: 150 mg via INTRAVENOUS

## 2012-06-11 MED ORDER — FENTANYL CITRATE 0.05 MG/ML IJ SOLN
100.0000 ug | Freq: Once | INTRAMUSCULAR | Status: AC
  Administered 2012-06-11: 50 ug via INTRAVENOUS

## 2012-06-11 MED ORDER — LACTATED RINGERS IV SOLN
INTRAVENOUS | Status: DC
  Administered 2012-06-11 (×2): via INTRAVENOUS

## 2012-06-11 MED ORDER — DEXAMETHASONE SODIUM PHOSPHATE 4 MG/ML IJ SOLN
INTRAMUSCULAR | Status: DC | PRN
  Administered 2012-06-11: 10 mg via INTRAVENOUS

## 2012-06-11 MED ORDER — CEFAZOLIN SODIUM-DEXTROSE 2-3 GM-% IV SOLR
2.0000 g | Freq: Once | INTRAVENOUS | Status: AC
  Administered 2012-06-11: 2 g via INTRAVENOUS

## 2012-06-11 SURGICAL SUPPLY — 71 items
BANDAGE CONFORM 3  STR LF (GAUZE/BANDAGES/DRESSINGS) IMPLANT
BANDAGE ELASTIC 3 VELCRO ST LF (GAUZE/BANDAGES/DRESSINGS) ×2 IMPLANT
BANDAGE GAUZE ELAST BULKY 4 IN (GAUZE/BANDAGES/DRESSINGS) ×2 IMPLANT
BIT DRILL 2.0 LNG QUCK RELEASE (BIT) ×1 IMPLANT
BIT DRILL 2.8X5 QR DISP (BIT) ×2 IMPLANT
BLADE MINI RND TIP GREEN BEAV (BLADE) ×2 IMPLANT
BLADE SURG 15 STRL LF DISP TIS (BLADE) ×2 IMPLANT
BLADE SURG 15 STRL SS (BLADE) ×2
BNDG ELASTIC 2 VLCR STRL LF (GAUZE/BANDAGES/DRESSINGS) IMPLANT
BNDG ESMARK 4X9 LF (GAUZE/BANDAGES/DRESSINGS) ×2 IMPLANT
CHLORAPREP W/TINT 26ML (MISCELLANEOUS) ×2 IMPLANT
CLOTH BEACON ORANGE TIMEOUT ST (SAFETY) ×2 IMPLANT
CORDS BIPOLAR (ELECTRODE) ×2 IMPLANT
COVER MAYO STAND STRL (DRAPES) ×2 IMPLANT
COVER TABLE BACK 60X90 (DRAPES) ×2 IMPLANT
DRAPE EXTREMITY T 121X128X90 (DRAPE) ×2 IMPLANT
DRAPE OEC MINIVIEW 54X84 (DRAPES) ×2 IMPLANT
DRAPE SURG 17X23 STRL (DRAPES) ×2 IMPLANT
DRILL 2.0 LNG QUICK RELEASE (BIT) ×2
GAUZE XEROFORM 1X8 LF (GAUZE/BANDAGES/DRESSINGS) ×2 IMPLANT
GLOVE BIO SURGEON STRL SZ7.5 (GLOVE) ×2 IMPLANT
GLOVE BIOGEL PI IND STRL 6.5 (GLOVE) ×2 IMPLANT
GLOVE BIOGEL PI IND STRL 8 (GLOVE) ×1 IMPLANT
GLOVE BIOGEL PI IND STRL 8.5 (GLOVE) ×1 IMPLANT
GLOVE BIOGEL PI INDICATOR 6.5 (GLOVE) ×2
GLOVE BIOGEL PI INDICATOR 8 (GLOVE) ×1
GLOVE BIOGEL PI INDICATOR 8.5 (GLOVE) ×1
GLOVE SKINSENSE NS SZ7.5 (GLOVE) ×2
GLOVE SKINSENSE NS SZ8.0 LF (GLOVE) ×1
GLOVE SKINSENSE STRL SZ7.5 (GLOVE) ×2 IMPLANT
GLOVE SKINSENSE STRL SZ8.0 LF (GLOVE) ×1 IMPLANT
GLOVE SURG ORTHO 8.0 STRL STRW (GLOVE) IMPLANT
GOWN PREVENTION PLUS XLARGE (GOWN DISPOSABLE) ×2 IMPLANT
GOWN STRL REIN XL XLG (GOWN DISPOSABLE) ×4 IMPLANT
GUIDEWIRE ORTHO 0.054X6 (WIRE) ×6 IMPLANT
NEEDLE HYPO 22GX1.5 SAFETY (NEEDLE) IMPLANT
NEEDLE HYPO 25X1 1.5 SAFETY (NEEDLE) IMPLANT
NS IRRIG 1000ML POUR BTL (IV SOLUTION) ×2 IMPLANT
PACK BASIN DAY SURGERY FS (CUSTOM PROCEDURE TRAY) ×2 IMPLANT
PAD CAST 3X4 CTTN HI CHSV (CAST SUPPLIES) ×1 IMPLANT
PAD CAST 4YDX4 CTTN HI CHSV (CAST SUPPLIES) IMPLANT
PADDING CAST ABS 4INX4YD NS (CAST SUPPLIES) ×1
PADDING CAST ABS COTTON 4X4 ST (CAST SUPPLIES) ×1 IMPLANT
PADDING CAST COTTON 3X4 STRL (CAST SUPPLIES) ×1
PADDING CAST COTTON 4X4 STRL (CAST SUPPLIES)
PLATE ACULOCK 2 NARROW RT (Plate) ×2 IMPLANT
SCREW 3.5MMX10.0MM (Screw) ×4 IMPLANT
SCREW 3.5MMX12.0MM (Screw) ×2 IMPLANT
SCREW BN FT 16X2.3XLCK HEX CRT (Screw) ×2 IMPLANT
SCREW CORTICAL LOCKING 2.3X16M (Screw) ×4 IMPLANT
SCREW CORTICAL LOCKING 2.3X18M (Screw) ×2 IMPLANT
SCREW FX16X2.3XLCK SMTH NS CRT (Screw) ×2 IMPLANT
SCREW FX18X2.3XSMTH LCK NS CRT (Screw) ×2 IMPLANT
SCREW NON TOGG 2.3X18MM (Screw) ×2 IMPLANT
SLEEVE SCD COMPRESS KNEE MED (MISCELLANEOUS) ×2 IMPLANT
SPLINT PLASTER CAST XFAST 4X15 (CAST SUPPLIES) IMPLANT
SPLINT PLASTER XTRA FAST SET 4 (CAST SUPPLIES)
SPONGE GAUZE 4X4 12PLY (GAUZE/BANDAGES/DRESSINGS) ×2 IMPLANT
STOCKINETTE 4X48 STRL (DRAPES) ×2 IMPLANT
SUCTION FRAZIER TIP 10 FR DISP (SUCTIONS) IMPLANT
SUT ETHILON 3 0 PS 1 (SUTURE) IMPLANT
SUT ETHILON 4 0 PS 2 18 (SUTURE) ×4 IMPLANT
SUT VIC AB 3-0 PS1 18 (SUTURE)
SUT VIC AB 3-0 PS1 18XBRD (SUTURE) IMPLANT
SUT VICRYL 4-0 PS2 18IN ABS (SUTURE) ×2 IMPLANT
SYR BULB 3OZ (MISCELLANEOUS) ×2 IMPLANT
SYR CONTROL 10ML LL (SYRINGE) IMPLANT
TOWEL OR 17X24 6PK STRL BLUE (TOWEL DISPOSABLE) ×2 IMPLANT
TUBE CONNECTING 20X1/4 (TUBING) IMPLANT
UNDERPAD 30X30 INCONTINENT (UNDERPADS AND DIAPERS) ×2 IMPLANT
WATER STERILE IRR 1000ML POUR (IV SOLUTION) IMPLANT

## 2012-06-11 NOTE — Anesthesia Procedure Notes (Addendum)
Anesthesia Regional Block:  Supraclavicular block  Pre-Anesthetic Checklist: ,, timeout performed, Correct Patient, Correct Site, Correct Laterality, Correct Procedure, Correct Position, site marked, Risks and benefits discussed, Surgical consent,  Pre-op evaluation,  At surgeon's request  Laterality: Right and Upper  Prep: chloraprep       Needles:   Needle Type: Echogenic Needle     Needle Length: 10cm 10 cm Needle Gauge: 22 and 22 G  Needle insertion depth: 6 cm   Additional Needles:  Procedures: ultrasound guided  Motor weakness within 15 minutes. Supraclavicular block Narrative:  Start time: 06/11/2012 8:50 AM End time: 06/11/2012 9:06 AM Injection made incrementally with aspirations every 5 mL.  Performed by: Personally  Anesthesiologist: Massagee , T  Additional Notes: Tolerated Well no problems     Procedure Name: LMA Insertion Date/Time: 06/11/2012 10:05 AM Performed by: Verlan Friends Pre-anesthesia Checklist: Patient identified, Emergency Drugs available, Suction available and Patient being monitored Patient Re-evaluated:Patient Re-evaluated prior to inductionOxygen Delivery Method: Circle System Utilized Preoxygenation: Pre-oxygenation with 100% oxygen Intubation Type: IV induction Ventilation: Mask ventilation without difficulty LMA: LMA inserted LMA Size: 4.0 Number of attempts: 1 Airway Equipment and Method: bite block Placement Confirmation: positive ETCO2 and breath sounds checked- equal and bilateral Tube secured with: Tape Dental Injury: Teeth and Oropharynx as per pre-operative assessment

## 2012-06-11 NOTE — H&P (Signed)
Beth Christian is an 61 y.o. female.   Chief Complaint: right wrist fracture HPI: 61 yo rhd female fell on right hand 06/08/12.  Seen at [facility] where XR revealed right distal radius fracture.  Splinted and followed up in office.  Reports no previous injury to right wrist and no other injury at this time.  Past Medical History  Diagnosis Date  . Coronary artery disease     s/p RCA DES   . Depression   . Hypothyroidism   . Tinnitus of both ears   . DJD (degenerative joint disease)   . Urinary, incontinence, stress female   . Acrocyanosis   . CVA (cerebral infarction)     occured during heart cath  . Hypertension   . Stroke     june 2012 after stent placed, no residual    Past Surgical History  Procedure Date  . Thumb srugery 2004  . Coronary angioplasty with stent placement 2012  . Chest tube for pneumonthorax after rib fracture   . Tonsillectomy     as child    Family History  Problem Relation Age of Onset  . Hypertension Mother   . Kidney failure Father     deceased  . Coronary artery disease Father    Social History:  reports that she quit smoking about 32 years ago. She has never used smokeless tobacco. She reports that she drinks about one ounce of alcohol per week. She reports that she does not use illicit drugs.  Allergies:  Allergies  Allergen Reactions  . Latex Rash    Medications Prior to Admission  Medication Sig Dispense Refill  . aspirin EC 81 MG tablet Take 81 mg by mouth daily.      Marland Kitchen buPROPion (WELLBUTRIN XL) 150 MG 24 hr tablet Take 150 mg by mouth 3 (three) times daily.       Marland Kitchen CALCIUM-VITAMIN D PO Take 1 tablet by mouth daily.       . Cholecalciferol (VITAMIN D-3 PO) Take 1 tablet by mouth daily.       . citalopram (CELEXA) 20 MG tablet Take 20 mg by mouth daily.        . clonazePAM (KLONOPIN) 0.5 MG tablet Take 0.5 mg by mouth at bedtime as needed. For sleep      . Cyanocobalamin (VITAMIN B-12 PO) Take 3,000 mcg by mouth daily.       . fish  oil-omega-3 fatty acids 1000 MG capsule Take 1 g by mouth daily.       Marland Kitchen HYDROcodone-acetaminophen (NORCO/VICODIN) 5-325 MG per tablet Take 1-2 tablets every 6 hours as needed for severe pain  15 tablet  0  . levothyroxine (SYNTHROID, LEVOTHROID) 75 MCG tablet Take 75 mcg by mouth daily.        Marland Kitchen loratadine (CLARITIN) 10 MG tablet Take 10 mg by mouth daily.      . Multiple Vitamins-Minerals (MULTIVITAMINS THER. W/MINERALS) TABS Take 1 tablet by mouth daily.        . rosuvastatin (CRESTOR) 20 MG tablet Take 10 mg by mouth at bedtime.       Marland Kitchen VITAMIN E PO Take 1 tablet by mouth daily.        . nitroGLYCERIN (NITROSTAT) 0.4 MG SL tablet Place 0.4 mg under the tongue every 5 (five) minutes as needed. For chest pain         No results found for this or any previous visit (from the past 48 hour(s)).  No results found.  A comprehensive review of systems was negative except for: Constitutional: positive for weight loss Eyes: positive for contacts/glasses Neurological: positive for gait problems  Blood pressure 134/79, pulse 61, temperature 97.8 F (36.6 C), temperature source Oral, resp. rate 18, height 5\' 3"  (1.6 m), weight 61.236 kg (135 lb), SpO2 96.00%.  General appearance: alert, cooperative and appears stated age Head: Normocephalic, without obvious abnormality, atraumatic Neck: supple, symmetrical, trachea midline Resp: clear to auscultation bilaterally Cardio: regular rate and rhythm GI: non tender Extremities: light touch sensation and capillary refill intact all digits.  +epl/fpl/io.  ttp right wrist.  skin intact. Pulses: 2+ and symmetric Skin: Skin color, texture, turgor normal. No rashes or lesions Neurologic: Grossly normal Incision/Wound: na  Assessment/Plan Right distal radius fracture.  Non operative and operative treatment options were discussed with the patient and patient wishes to proceed with operative treatment.  Risks, benefits, and alternatives of surgery were  discussed and the patient agrees with the plan of care.   [name] 06/11/2012, 8:39 AM

## 2012-06-11 NOTE — Op Note (Signed)
Dictation [id number]

## 2012-06-11 NOTE — Anesthesia Preprocedure Evaluation (Addendum)
Anesthesia Evaluation  Patient identified by MRN, date of birth, ID band Patient awake    Reviewed: Allergy & Precautions, H&P , NPO status , Patient's Chart, lab work & pertinent test results  History of Anesthesia Complications Negative for: history of anesthetic complications  Airway Mallampati: I  Neck ROM: Full    Dental  (+) Teeth Intact   Pulmonary neg pulmonary ROS,  breath sounds clear to auscultation        Cardiovascular hypertension, + CAD Rhythm:Regular Rate:Normal  Stents   Neuro/Psych Depression CVA    GI/Hepatic   Endo/Other  Hypothyroidism   Renal/GU      Musculoskeletal   Abdominal   Peds  Hematology   Anesthesia Other Findings   Reproductive/Obstetrics                          Anesthesia Physical Anesthesia Plan  ASA: III  Anesthesia Plan:    Post-op Pain Management:    Induction: Intravenous  Airway Management Planned: LMA  Additional Equipment:   Intra-op Plan:   Post-operative Plan: Extubation in OR  Informed Consent: I have reviewed the patients History and Physical, chart, labs and discussed the procedure including the risks, benefits and alternatives for the proposed anesthesia with the patient or authorized representative who has indicated his/her understanding and acceptance.     Plan Discussed with: CRNA and Surgeon  Anesthesia Plan Comments:         Anesthesia Quick Evaluation

## 2012-06-11 NOTE — Anesthesia Postprocedure Evaluation (Signed)
  Anesthesia Post-op Note  Patient: Beth Christian  Procedure(s) Performed: Procedure(s) (LRB) with comments: OPEN REDUCTION INTERNAL FIXATION (ORIF) DISTAL RADIAL FRACTURE (Right) - ORIF RIGHT DISTAL RADIUS FRACTURE  Patient Location: PACU  Anesthesia Type:General and GA combined with regional for post-op pain  Level of Consciousness: awake and alert   Airway and Oxygen Therapy: Patient Spontanous Breathing  Post-op Pain: none  Post-op Assessment: Post-op Vital signs reviewed  Post-op Vital Signs: stable  Complications: No apparent anesthesia complications

## 2012-06-11 NOTE — Transfer of Care (Signed)
Immediate Anesthesia Transfer of Care Note  Patient: Beth Christian  Procedure(s) Performed: Procedure(s) (LRB) with comments: OPEN REDUCTION INTERNAL FIXATION (ORIF) DISTAL RADIAL FRACTURE (Right) - ORIF RIGHT DISTAL RADIUS FRACTURE  Patient Location: PACU  Anesthesia Type:GA combined with regional for post-op pain  Level of Consciousness: sedated and patient cooperative  Airway & Oxygen Therapy: Patient Spontanous Breathing and Patient connected to face mask oxygen  Post-op Assessment: Post -op Vital signs reviewed and stable  Post vital signs: Reviewed and stable  Complications: No apparent anesthesia complications

## 2012-06-11 NOTE — Progress Notes (Signed)
Assisted Dr. Massagee with right, ultrasound guided, supraclavicular block. Side rails up, monitors on throughout procedure. See vital signs in flow sheet. Tolerated Procedure well. 

## 2012-06-12 NOTE — Op Note (Signed)
NAMELANEA, SPRUIELL NO.:  [id number]  MEDICAL RECORD NO.:  [id number]  LOCATION:                                 FACILITY:  PHYSICIAN:  Beth Loa, MD        DATE OF BIRTH:  10-Oct-1950  DATE OF PROCEDURE:  06/11/2012 DATE OF DISCHARGE:                              OPERATIVE REPORT   PREOPERATIVE DIAGNOSIS:  Right comminuted intra-articular distal radius fracture.  POSTOPERATIVE DIAGNOSIS:  Right comminuted intra-articular distal radius fracture.  PROCEDURE:  Open reduction and internal fixation of right comminuted intra-articular distal radius fracture.  SURGEON:  Beth Loa, MD  ASSISTANT:  Cindee Salt, M.D.  ANESTHESIA:  General with regional.  IV FLUIDS:  Per anesthesia flow sheet.  ESTIMATED BLOOD LOSS:  Minimal.  COMPLICATIONS:  None.  SPECIMENS:  None.  TOURNIQUET TIME:  59 minutes.  DISPOSITION:  Stable to PACU.  INDICATIONS:  Beth Christian is a 61 year old female who fell from a standing height this past weekend.  She was seen at the emergency department where radiographs were taken revealing a distal radius fracture.  She followed up with me in the office.  On evaluation, she had intact sensation and capillary refill in all fingertips.  She can flex and extend the IP joint of her thumb and across her fingers.  I discussed with Beth Christian the nature of the injury.  We discussed nonoperative and operative treatment options.  She wished to proceed with operative fixation.  Risks, benefits, and alternatives of surgery were discussed including the risk of blood loss, infection, damage to nerves, vessels, tendons, ligaments, bone; failure of surgery; need for additional surgery, complications with wound healing, continued pain, nonunion, malunion, stiffness.  She voiced understanding of these risks and elected to proceed.  OPERATIVE COURSE:  After being identified preoperatively by myself, the patient and I agreed upon the procedure and  site of procedure.  Surgical site was marked.  Risks, benefits, and alternatives of surgery were reviewed and she wished to proceed.  Surgical consent had been signed. She was given 2 g of IV Ancef as preoperative antibiotic prophylaxis. She was given a regional block by anesthesia in the preoperative holding.  She was transported to the operating room and placed on the operative table in a supine position with the right upper extremity on arm board.  General anesthesia was induced by the anesthesiologist.  The right upper extremity was prepped and draped in normal sterile orthopedic fashion.  Surgical pause was performed between surgeons, anesthesia, and operating staff, and all were in agreement as to the patient, procedure, and site of procedure.  Tourniquet at the proximal aspect of the extremity was inflated to 250 mmHg after exsanguination of the limb with Esmarch bandage.  Standard volar Sherilyn Cooter approach was used. The superficial and deep portions of the FCR tendon sheath were incised and the FCR and FPL retracted ulnarly to protect the palmar cutaneous branch of median nerve.  The brachioradialis was released from the radial side of the radius.  The pronator was released from the radial side and elevated with a periosteal elevator.  The fracture site was easily identified.  There was a volar  cortical fragment.  The fracture coursed intra-articular at the ulnar side.  An Acumed volar distal radial locking plate was selected.  This was a narrow plate.  It was secured to the bone using guide pins.  The C-arm was used in AP and lateral projections to ensure appropriate reduction and position of hardware, which was the case.  A single screw was placed in the slotted hole on the shaft of the plate.  A nonlocking screw was placed in the distal ulnar hole.  This brought the bone up to the plate.  The remaining holes in the distal aspect of the plate were filled with nonlocking pegs with the  exception of the radial styloid holes which were filled with locking screws.  The nonlocking screw was then exchanged for a locking peg.  The remaining 2 holes in the shaft of the plate were filled again using standard AO drilling measuring technique. C-arm was used in AP, lateral, and oblique projections to ensure appropriate reduction and placement of hardware, which was the case. There was no intra-articular penetration.  She had full pronation and supination.  The distal radioulnar joint was stable to shuck testing. The wound was copiously irrigated with sterile saline.  The pronator was repaired back over top of the plate using 4-0 Vicryl suture.  A single inverted interrupted Vicryl suture was placed in subcutaneous tissues and the skin was closed with 4-0 nylon in a horizontal mattress fashion. The wound was dressed with sterile Xeroform, 4x4s, and wrapped with Kerlix.  A volar splint was placed and wrapped with an Ace bandage.  The tourniquet was deflated at 59 minutes.  The fingertips were pink with brisk capillary refill after deflation of tourniquet.  Operative drapes were broken down and the patient was awoken from anesthesia safely.  She was transferred back to stretcher and taken to PACU in stable condition. I will see her back in the office in 1 week for postoperative followup. I will give Percocet 5/325, 1-2 p.o. q.6 hours p.r.n. pain, dispensed #40.     Beth Loa, MD     KK/MEDQ  D:  06/11/2012  T:  06/12/2012  Job:  [id number]

## 2012-06-14 ENCOUNTER — Encounter (HOSPITAL_COMMUNITY): Payer: Self-pay

## 2012-06-17 NOTE — ED Provider Notes (Signed)
Medical screening examination/treatment/procedure(s) were performed by non-physician practitioner and as supervising physician I was immediately available for consultation/collaboration.   Gavin Pound. Oletta Lamas, MD 06/17/12 2028

## 2012-10-11 ENCOUNTER — Other Ambulatory Visit: Payer: Self-pay | Admitting: Gastroenterology

## 2012-11-13 ENCOUNTER — Encounter (HOSPITAL_COMMUNITY): Payer: Self-pay | Admitting: *Deleted

## 2012-11-21 IMAGING — CT CT ORBITS WITH CONTRAST
1 of 2 series · 14 of 30 positions shown, 18 images · IV contrast (isovue)
Comparison: none

REASON FOR EXAM: double vision  LATEX allergy
COMMENTS:

PROCEDURE:     CT  - CT ORBITS OR TEMPORAL BONE W  - March 08, 2011  [DATE]
RESULT:     Comparison: None.
TECHNIQUE: Multiple axial images obtained of the orbits before and after the
administration of 75 mL Isovue 300 intravenous contrast. Coronal reformed
were performed.

[Series 2: orbits_wo · axial · 0.24mm/px · z∈[+1182,+1236]mm · 14 of 22 slices shown, 18 images]
[im 2/22  brain]
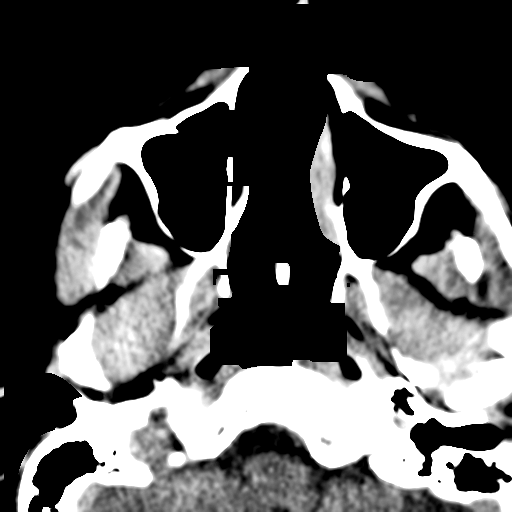
[im 2/22  bone]
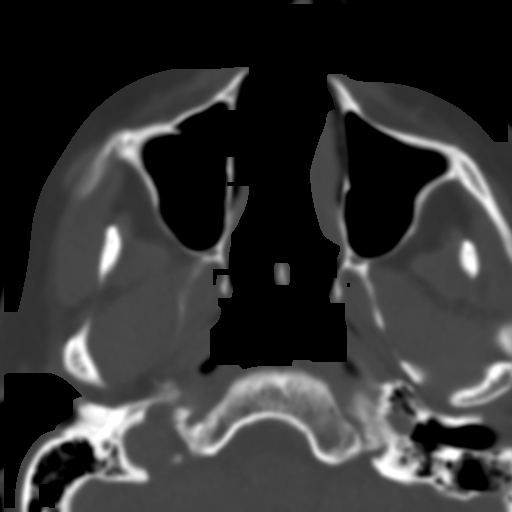
[im 3/22  bone]
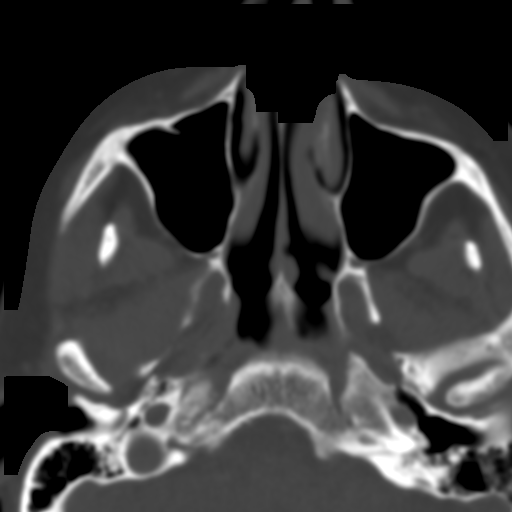
[im 5/22  bone]
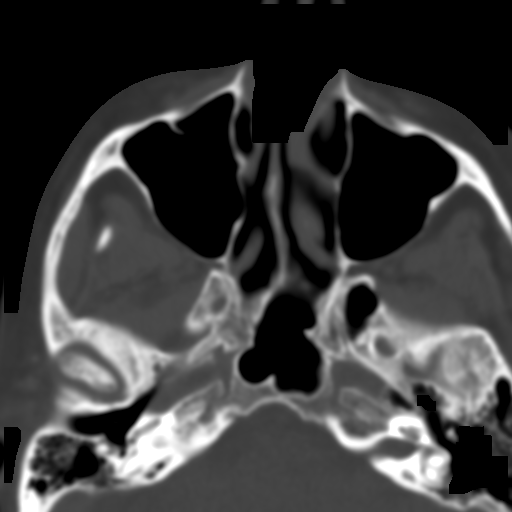
[im 6/22  bone]
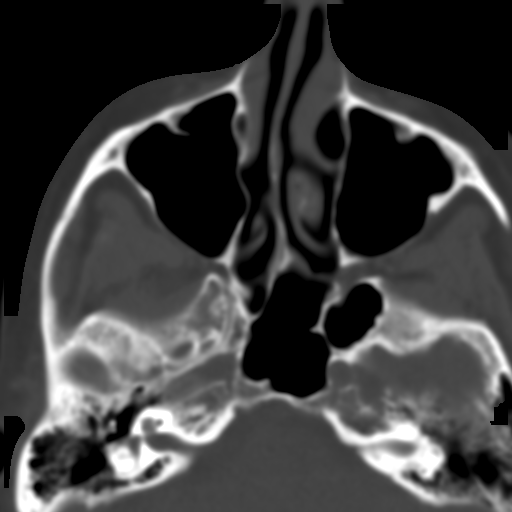
[im 8/22  brain]
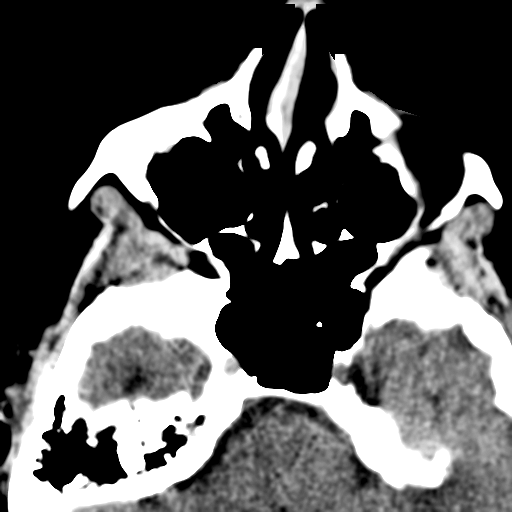
[im 8/22  bone]
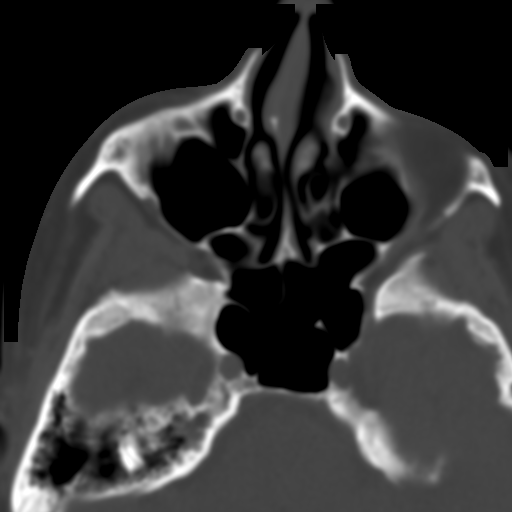
[im 9/22  bone]
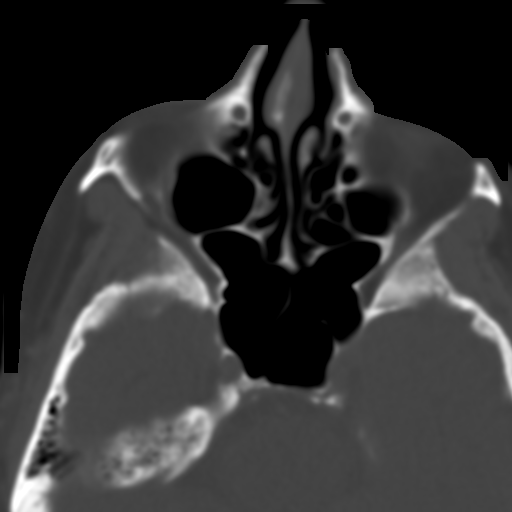
[im 10/22  bone]
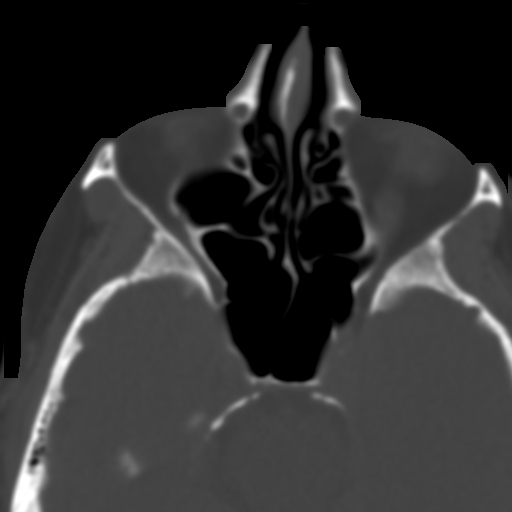
[im 12/22  bone]
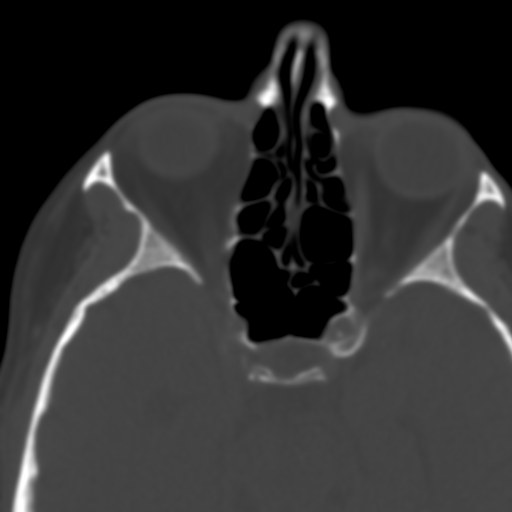
[im 13/22  brain]
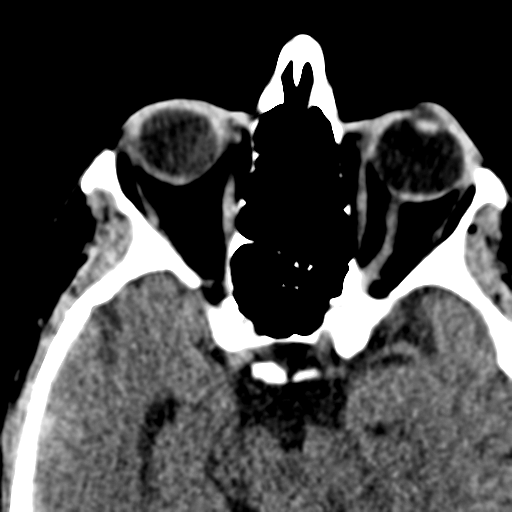
[im 13/22  bone]
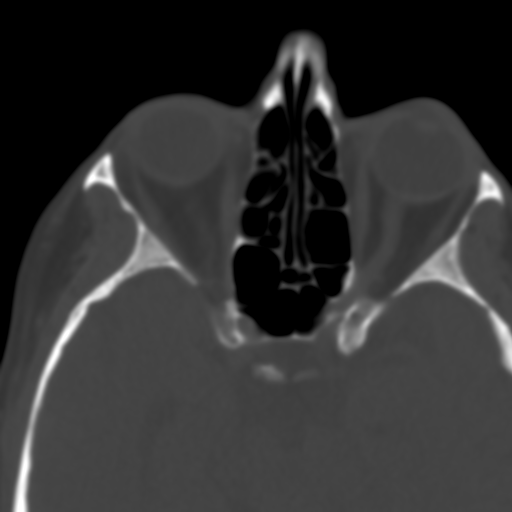
[im 15/22  bone]
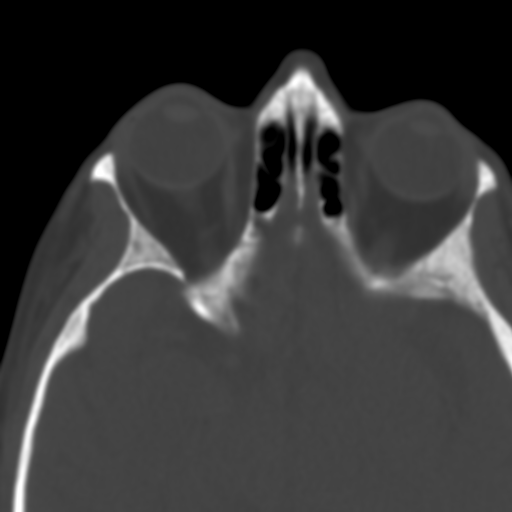
[im 16/22  bone]
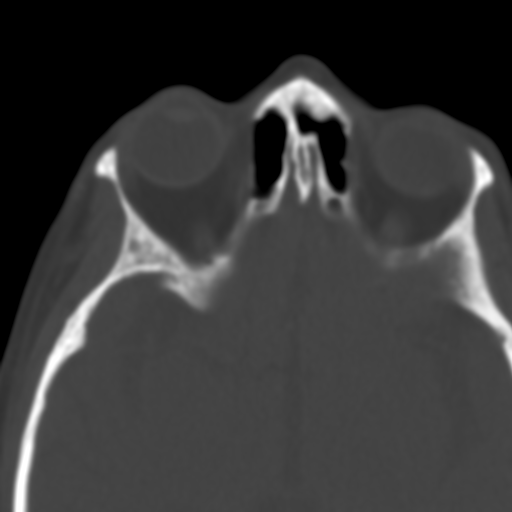
[im 17/22  bone]
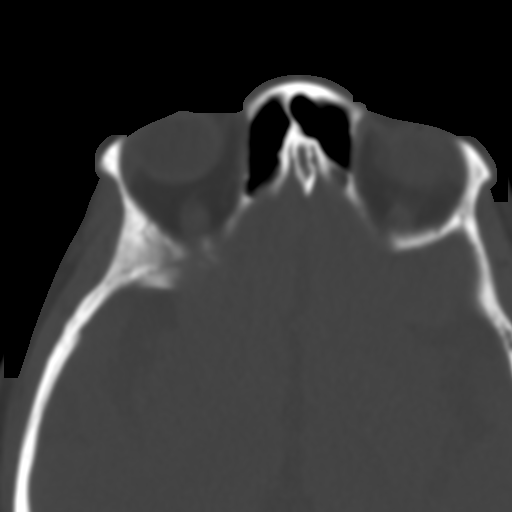
[im 19/22  brain]
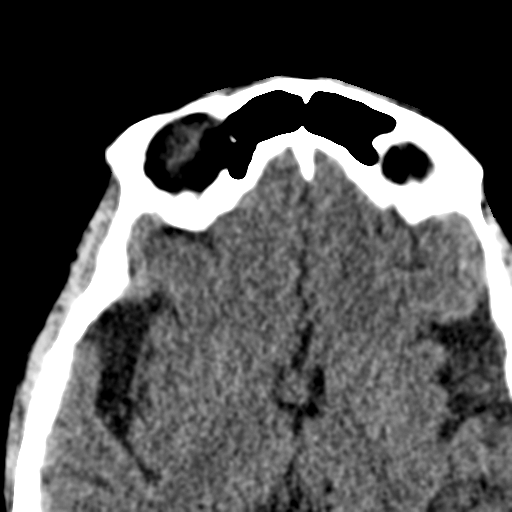
[im 19/22  bone]
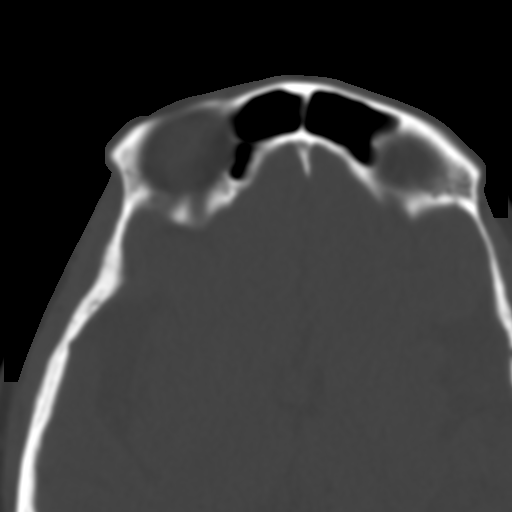
[im 20/22  bone]
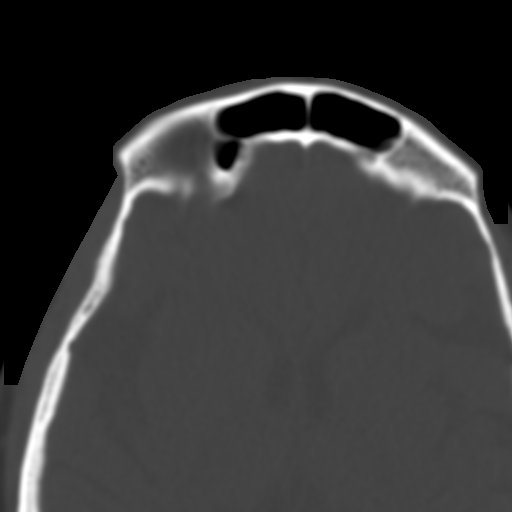

[14 of 30 positions shown; findings below may reference images not displayed]

FINDINGS: The extraocular muscles are normal in size. No intraconal or extraconal mass
identified. No stranding in the retro-orbital fat. The optic nerves are
normal in size. No abnormal enhancement along the optic nerve sheath. The
globes are intact.

The visualized paranasal sinuses are well aerated.
IMPRESSION: Unremarkable orbits CT.

## 2012-12-02 ENCOUNTER — Encounter (HOSPITAL_COMMUNITY): Payer: Self-pay | Admitting: *Deleted

## 2012-12-02 ENCOUNTER — Ambulatory Visit (HOSPITAL_COMMUNITY)
Admission: RE | Admit: 2012-12-02 | Discharge: 2012-12-02 | Disposition: A | Discharge status: Home / Self Care | Payer: BC Managed Care – PPO | Source: Ambulatory Visit | Attending: Gastroenterology | Admitting: Gastroenterology

## 2012-12-02 ENCOUNTER — Encounter (HOSPITAL_COMMUNITY)
Admission: RE | Disposition: A | Discharge status: Home / Self Care | Payer: Self-pay | Source: Ambulatory Visit | Attending: Gastroenterology

## 2012-12-02 ENCOUNTER — Ambulatory Visit (HOSPITAL_COMMUNITY): Payer: BC Managed Care – PPO | Admitting: Anesthesiology

## 2012-12-02 ENCOUNTER — Encounter (HOSPITAL_COMMUNITY): Payer: Self-pay | Admitting: Anesthesiology

## 2012-12-02 DIAGNOSIS — N393 Stress incontinence (female) (male): Secondary | ICD-10-CM | POA: Insufficient documentation

## 2012-12-02 DIAGNOSIS — M899 Disorder of bone, unspecified: Secondary | ICD-10-CM | POA: Insufficient documentation

## 2012-12-02 DIAGNOSIS — I2510 Atherosclerotic heart disease of native coronary artery without angina pectoris: Secondary | ICD-10-CM | POA: Insufficient documentation

## 2012-12-02 DIAGNOSIS — G2581 Restless legs syndrome: Secondary | ICD-10-CM | POA: Insufficient documentation

## 2012-12-02 DIAGNOSIS — Z1211 Encounter for screening for malignant neoplasm of colon: Secondary | ICD-10-CM | POA: Insufficient documentation

## 2012-12-02 DIAGNOSIS — IMO0002 Reserved for concepts with insufficient information to code with codable children: Secondary | ICD-10-CM | POA: Insufficient documentation

## 2012-12-02 DIAGNOSIS — E039 Hypothyroidism, unspecified: Secondary | ICD-10-CM | POA: Insufficient documentation

## 2012-12-02 DIAGNOSIS — Z8673 Personal history of transient ischemic attack (TIA), and cerebral infarction without residual deficits: Secondary | ICD-10-CM | POA: Insufficient documentation

## 2012-12-02 DIAGNOSIS — F329 Major depressive disorder, single episode, unspecified: Secondary | ICD-10-CM | POA: Insufficient documentation

## 2012-12-02 DIAGNOSIS — M949 Disorder of cartilage, unspecified: Secondary | ICD-10-CM | POA: Insufficient documentation

## 2012-12-02 DIAGNOSIS — I7389 Other specified peripheral vascular diseases: Secondary | ICD-10-CM | POA: Insufficient documentation

## 2012-12-02 HISTORY — PX: COLONOSCOPY WITH PROPOFOL: SHX5780

## 2012-12-02 SURGERY — COLONOSCOPY WITH PROPOFOL
Anesthesia: Monitor Anesthesia Care

## 2012-12-02 MED ORDER — SODIUM CHLORIDE 0.9 % IV SOLN: INTRAVENOUS | Status: DC

## 2012-12-02 MED ORDER — KETAMINE HCL 10 MG/ML IJ SOLN
INTRAMUSCULAR | Status: DC | PRN
  Administered 2012-12-02 (×2): 20 mg via INTRAVENOUS

## 2012-12-02 MED ORDER — MIDAZOLAM HCL 5 MG/5ML IJ SOLN
INTRAMUSCULAR | Status: DC | PRN
  Administered 2012-12-02: 2 mg via INTRAVENOUS

## 2012-12-02 MED ORDER — PROPOFOL INFUSION 10 MG/ML OPTIME
INTRAVENOUS | Status: DC | PRN
  Administered 2012-12-02: 75 ug/kg/min via INTRAVENOUS

## 2012-12-02 MED ORDER — LACTATED RINGERS IV SOLN
INTRAVENOUS | Status: DC
  Administered 2012-12-02 (×2): via INTRAVENOUS

## 2012-12-02 SURGICAL SUPPLY — 21 items

## 2012-12-02 NOTE — Op Note (Signed)
Procedure: Screening colonoscopy  Endoscopist: Danise Edge  Indication: Propofol administered by anesthesia  Procedure: The patient was placed in the left lateral decubitus position. Anal inspection and digital rectal exam were normal. The Pentax pediatric colonoscope was introduced into the rectum and advanced to the cecum. A normal-appearing ileocecal valve and appendiceal orifice were identified. Colonic preparation for the exam today was good.  Rectum. Normal. Retroflex view of the distal rectum normal.  Sigmoid colon and descending colon. Normal.  Splenic flexure. Normal.  Transverse colon. Normal.  Hepatic flexure. Normal.  Ascending colon. Normal.  Cecum and ileocecal valve. Normal.  Assessment: Normal screening proctocolonoscopy to the cecum  Recommendations: schedule repeat screening colonoscopy in 10 years

## 2012-12-02 NOTE — H&P (Signed)
  Procedure: Screening colonoscopy  History: The patient is a 62 year old female who underwent a normal screening colonoscopy on 06/21/2001.  The patient is scheduled to undergo a repeat screening colonoscopy today.  Chronic medications: Crestor. Aspirin. Levothyroxin. Wellbutrin. Lorazepam. Citalopram. Sublingual nitroglycerin.  Past medical and surgical history: Coronary artery disease. Drug-eluting coronary artery stent placed into the right coronary artery in 2012. Embolic stroke during cardiac catheterization in 2012. Hypothyroidism. Degenerative joint disease. Tinnitus. Stress urinary incontinence. Major depression. Acrocyanosis. Osteopenia. Restless leg syndrome. Tonsillectomy. Surgery following right wrist fracture.  Medication allergies: None.  Habits: The patient quit smoking cigarettes in 1985. She had a 10 year pack history of cigarette smoking. She does consumes alcohol in moderation.  Exam: The patient is alert and lying comfortably on the endoscopy stretcher. Cardiac exam reveals a regular rhythm. Lungs are clear to auscultation. Abdomen is soft, flat, and nontender to palpation.  Plan: Proceed with repeat screening colonoscopy.

## 2012-12-02 NOTE — Anesthesia Preprocedure Evaluation (Addendum)
Anesthesia Evaluation  Patient identified by MRN, date of birth, ID band Patient awake    Reviewed: Allergy & Precautions, H&P , NPO status , Patient's Chart, lab work & pertinent test results  Airway Mallampati: II TM Distance: >3 FB Neck ROM: full    Dental no notable dental hx. (+) Teeth Intact and Dental Advisory Given   Pulmonary neg pulmonary ROS,  breath sounds clear to auscultation  Pulmonary exam normal       Cardiovascular Exercise Tolerance: Good hypertension, + CAD and + Cardiac Stents Rhythm:regular Rate:Normal     Neuro/Psych Depression CVA, No Residual Symptoms negative psych ROS   GI/Hepatic negative GI ROS, Neg liver ROS,   Endo/Other  Hypothyroidism   Renal/GU negative Renal ROS  negative genitourinary   Musculoskeletal   Abdominal   Peds  Hematology negative hematology ROS (+)   Anesthesia Other Findings   Reproductive/Obstetrics negative OB ROS                          Anesthesia Physical Anesthesia Plan  ASA: III  Anesthesia Plan: MAC   Post-op Pain Management:    Induction:   Airway Management Planned: Simple Face Mask  Additional Equipment:   Intra-op Plan:   Post-operative Plan:   Informed Consent: I have reviewed the patients History and Physical, chart, labs and discussed the procedure including the risks, benefits and alternatives for the proposed anesthesia with the patient or authorized representative who has indicated his/her understanding and acceptance.   Dental Advisory Given  Plan Discussed with: CRNA and Surgeon  Anesthesia Plan Comments:         Anesthesia Quick Evaluation

## 2012-12-02 NOTE — Transfer of Care (Signed)
Immediate Anesthesia Transfer of Care Note  Patient: Beth Christian  Procedure(s) Performed: Procedure(s): COLONOSCOPY WITH PROPOFOL (N/A)  Patient Location: PACU and Endoscopy Unit  Anesthesia Type:MAC  Level of Consciousness: awake and sedated  Airway & Oxygen Therapy: Patient Spontanous Breathing and Patient connected to face mask oxygen  Post-op Assessment: Post -op Vital signs reviewed and stable  Post vital signs: Reviewed and stable  Complications: No apparent anesthesia complications

## 2012-12-02 NOTE — Anesthesia Postprocedure Evaluation (Signed)
  Anesthesia Post-op Note  Patient: Beth Christian  Procedure(s) Performed: Procedure(s) (LRB): COLONOSCOPY WITH PROPOFOL (N/A)  Patient Location: PACU  Anesthesia Type: MAC  Level of Consciousness: awake and alert   Airway and Oxygen Therapy: Patient Spontanous Breathing  Post-op Pain: mild  Post-op Assessment: Post-op Vital signs reviewed, Patient's Cardiovascular Status Stable, Respiratory Function Stable, Patent Airway and No signs of Nausea or vomiting  Last Vitals:  Filed Vitals:   12/02/12 1310  BP: 127/74  Pulse:   Temp:   Resp: 20    Post-op Vital Signs: stable   Complications: No apparent anesthesia complications

## 2012-12-03 ENCOUNTER — Encounter (HOSPITAL_COMMUNITY): Payer: Self-pay | Admitting: Gastroenterology

## 2013-09-29 ENCOUNTER — Other Ambulatory Visit: Payer: Self-pay | Admitting: Interventional Cardiology

## 2013-09-29 ENCOUNTER — Other Ambulatory Visit (INDEPENDENT_AMBULATORY_CARE_PROVIDER_SITE_OTHER): Payer: BC Managed Care – PPO

## 2013-09-29 DIAGNOSIS — E785 Hyperlipidemia, unspecified: Secondary | ICD-10-CM

## 2013-09-29 LAB — LIPID PANEL
CHOLESTEROL: 150 mg/dL (ref 0–200)
HDL: 59.9 mg/dL (ref >39.00)
LDL CALC: 72 mg/dL (ref 0–99)
TRIGLYCERIDES: 91 mg/dL (ref 0.0–149.0)
Total CHOL/HDL Ratio: 3
VLDL: 18.2 mg/dL (ref 0.0–40.0)

## 2013-09-29 LAB — ALT: ALT: 12 U/L (ref 0–35)

## 2013-10-02 ENCOUNTER — Telehealth: Payer: Self-pay

## 2013-10-02 DIAGNOSIS — E785 Hyperlipidemia, unspecified: Secondary | ICD-10-CM

## 2013-10-02 NOTE — Telephone Encounter (Signed)
Message copied by Jarvis NewcomerPARRIS-GODLEY, Georgie Haque S on Thu Oct 02, 2013  4:13 PM ------      Message from: Verdis PrimeSMITH, HENRY      Created: Wed Oct 01, 2013 11:39 AM       At target. Needs to repeat 1 year ------

## 2013-10-30 ENCOUNTER — Other Ambulatory Visit: Payer: Self-pay

## 2013-10-30 MED ORDER — ROSUVASTATIN CALCIUM 5 MG PO TABS: 10.0000 mg | ORAL_TABLET | Freq: Every day | ORAL | Status: DC

## 2013-12-11 ENCOUNTER — Telehealth: Payer: Self-pay | Admitting: Interventional Cardiology

## 2013-12-11 MED ORDER — ROSUVASTATIN CALCIUM 5 MG PO TABS: 10.0000 mg | ORAL_TABLET | Freq: Every day | ORAL | Status: DC

## 2013-12-11 NOTE — Telephone Encounter (Signed)
New problem     CRESTOR 10mg  1 per day Pt need 30 day called in to At [facility] [place]........ Pt has gotten 15 day needs 30day .

## 2013-12-11 NOTE — Telephone Encounter (Signed)
done

## 2013-12-11 NOTE — Telephone Encounter (Signed)
New problem     CRESTOR 10mg  1 per day  Pt need 30 day    At  [facility] [place].  Pt has gotten 15 day needs 30day .

## 2014-02-09 ENCOUNTER — Telehealth: Payer: Self-pay | Admitting: Interventional Cardiology

## 2014-02-09 MED ORDER — ROSUVASTATIN CALCIUM 10 MG PO TABS: 10.0000 mg | ORAL_TABLET | Freq: Every day | ORAL | Status: DC

## 2014-02-09 NOTE — Telephone Encounter (Signed)
Riki RuskJeremy, I dont see any notes, please review refill. Selena BattenKim

## 2014-02-09 NOTE — Telephone Encounter (Signed)
New problem     Pt needs Crestor 10 mg  1 tab daily refilled [facility]  .

## 2014-02-09 NOTE — Telephone Encounter (Signed)
Pt had labs done in February.  Lipids stable. Will refill medication.

## 2014-02-12 DIAGNOSIS — I639 Cerebral infarction, unspecified: Secondary | ICD-10-CM | POA: Insufficient documentation

## 2014-02-18 ENCOUNTER — Ambulatory Visit: Payer: BC Managed Care – PPO | Admitting: Interventional Cardiology

## 2014-03-12 ENCOUNTER — Ambulatory Visit: Payer: BC Managed Care – PPO | Admitting: Interventional Cardiology

## 2014-04-23 ENCOUNTER — Ambulatory Visit (INDEPENDENT_AMBULATORY_CARE_PROVIDER_SITE_OTHER): Payer: BC Managed Care – PPO | Admitting: Interventional Cardiology

## 2014-04-23 ENCOUNTER — Encounter: Payer: Self-pay | Admitting: Interventional Cardiology

## 2014-04-23 VITALS — BP 124/76 | HR 74 | Ht 62.0 in | Wt 154.0 lb

## 2014-04-23 DIAGNOSIS — E785 Hyperlipidemia, unspecified: Secondary | ICD-10-CM

## 2014-04-23 DIAGNOSIS — N393 Stress incontinence (female) (male): Secondary | ICD-10-CM

## 2014-04-23 DIAGNOSIS — Z9861 Coronary angioplasty status: Secondary | ICD-10-CM

## 2014-04-23 DIAGNOSIS — I2510 Atherosclerotic heart disease of native coronary artery without angina pectoris: Secondary | ICD-10-CM

## 2014-04-23 DIAGNOSIS — Z955 Presence of coronary angioplasty implant and graft: Secondary | ICD-10-CM

## 2014-04-23 DIAGNOSIS — I6320 Cerebral infarction due to unspecified occlusion or stenosis of unspecified precerebral arteries: Secondary | ICD-10-CM

## 2014-04-23 DIAGNOSIS — I6310 Cerebral infarction due to embolism of unspecified precerebral artery: Secondary | ICD-10-CM

## 2014-04-23 NOTE — Patient Instructions (Signed)
Your physician recommends that you continue on your current medications as directed. Please refer to the Current Medication list given to you today.  Your physician recommends that you return for a FASTING lipid profile an alt: in Feb 2015  Your physician wants you to follow-up in: 1 year with Dr.Smith You will receive a reminder letter in the mail two months in advance. If you don't receive a letter, please call our office to schedule the follow-up appointment.

## 2014-04-23 NOTE — Progress Notes (Signed)
Patient ID: Beth Christian, female   DOB: November 25, 1950, 63 y.o.   MRN: [id number]    [address], [address] [place], [place]  [address] Phone: [phone] Fax:  [phone]  Date:  04/23/2014   ID:  Beth Christian, DOB [birth year], MRN [id number]  PCP:  Lillia Mountain, MD   ASSESSMENT:  1. Coronary artery disease with RCA DES 2012, asymptomatic with reference to angina 2. Hyperlipidemia at target November 2015 3. Embolic stroke at the time of catheterization 2012, with complete resolution of neurological sequela  PLAN:  1. Plan continue her current medical regimen 2. Statin panel we done in February 3. Clinical followup in one year   SUBJECTIVE: Beth Christian is a 63 y.o. female who is asymptomatic with reference to cardiovascular complaints. She has had no neurological symptoms. Diplopia that she have following cardiac catheterization resolved 2 years ago and has not recurred. She is fully functional at work. 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 She is driving in having no complaints. She is now grandmother for 63-year-old.   Wt Readings from Last 3 Encounters:  04/23/14 154 lb (69.854 kg)  12/02/12 142 lb (64.411 kg)  12/02/12 142 lb (64.411 kg)     Past Medical History  Diagnosis Date  . Depression   . Hypothyroidism   . Tinnitus of both ears   . Urinary, incontinence, stress female   . Acrocyanosis   . CVA (cerebral infarction)     occured during heart cath  . DJD (degenerative joint disease)   . Coronary artery disease involving native coronary artery 01/2011 and 06/2011    DES RCA x2 (most recently for ISR)    Current Outpatient Prescriptions  Medication Sig Dispense Refill  . aspirin EC 81 MG tablet Take 81 mg by mouth daily.      Marland Kitchen buPROPion (WELLBUTRIN XL) 150 MG 24 hr tablet Take 150 mg by mouth 3 (three) times daily.       Marland Kitchen CALCIUM-VITAMIN D PO Take 1 tablet by mouth daily.       . Cholecalciferol (VITAMIN D-3  PO) Take 1 tablet by mouth daily.       . citalopram (CELEXA) 20 MG tablet Take 20 mg by mouth daily.        . clonazePAM (KLONOPIN) 0.5 MG tablet Take 0.5 mg by mouth at bedtime as needed. For sleep      . Cyanocobalamin (VITAMIN B-12 PO) Take 3,000 mcg by mouth daily.       . fish oil-omega-3 fatty acids 1000 MG capsule Take 1 g by mouth daily.       Marland Kitchen levothyroxine (SYNTHROID, LEVOTHROID) 75 MCG tablet Take 75 mcg by mouth daily.        . Multiple Vitamins-Minerals (MULTIVITAMINS THER. W/MINERALS) TABS Take 1 tablet by mouth daily.        . nitroGLYCERIN (NITROSTAT) 0.4 MG SL tablet Place 0.4 mg under the tongue every 5 (five) minutes as needed. For chest pain       . rosuvastatin (CRESTOR) 10 MG tablet Take 1 tablet (10 mg total) by mouth at bedtime.  30 tablet  3  . VITAMIN E PO Take 1 tablet by mouth daily.         No current facility-administered medications for this visit.    Allergies:    Allergies  Allergen Reactions  . Latex Rash    Social History:  The patient  reports  that she quit smoking about 34 years ago. She quit smokeless tobacco use about 30 years ago. She reports that she drinks about one ounce of alcohol per week. She reports that she does not use illicit drugs.   ROS:  Please see the history of present illness.   No claudication, syncope, or palpitations   All other systems reviewed and negative.   OBJECTIVE: VS:  BP 124/76  Pulse 74  Ht  (1.575 m)  Wt 154 lb (69.854 kg)  BMI 28.16 kg/m2 Well nourished, well developed, in no acute distress, appears healthy and has gained some weight HEENT: normal Neck: JVD flat. Carotid bruit absent  Cardiac:  normal S1, S2; RRR; no murmur Lungs:  clear to auscultation bilaterally, no wheezing, rhonchi or rales Abd: soft, nontender, no hepatomegaly Ext: Edema absent. Pulses 2+ Skin: warm and dry Neuro:  CNs 2-12 intact, no focal abnormalities noted  EKG:  Normal sinus rhythm       Signed, Darci Needle III,  MD 04/23/2014 5:57 PM

## 2014-06-13 ENCOUNTER — Other Ambulatory Visit: Payer: Self-pay | Admitting: Interventional Cardiology

## 2014-06-16 ENCOUNTER — Other Ambulatory Visit: Payer: Self-pay | Admitting: Interventional Cardiology

## 2014-07-22 ENCOUNTER — Encounter (HOSPITAL_COMMUNITY): Payer: Self-pay | Admitting: Interventional Cardiology

## 2014-08-25 ENCOUNTER — Ambulatory Visit: Payer: Self-pay | Admitting: Unknown Physician Specialty

## 2014-08-29 ENCOUNTER — Other Ambulatory Visit: Payer: Self-pay | Admitting: Interventional Cardiology

## 2014-09-02 ENCOUNTER — Other Ambulatory Visit: Payer: Self-pay | Admitting: Internal Medicine

## 2014-09-02 DIAGNOSIS — R928 Other abnormal and inconclusive findings on diagnostic imaging of breast: Secondary | ICD-10-CM

## 2014-09-09 ENCOUNTER — Other Ambulatory Visit: Payer: Self-pay | Admitting: Internal Medicine

## 2014-09-09 DIAGNOSIS — R928 Other abnormal and inconclusive findings on diagnostic imaging of breast: Secondary | ICD-10-CM

## 2014-09-15 ENCOUNTER — Ambulatory Visit
Admission: RE | Admit: 2014-09-15 | Discharge: 2014-09-15 | Disposition: A | Discharge status: Home / Self Care | Payer: BC Managed Care – PPO | Source: Ambulatory Visit | Attending: Internal Medicine | Admitting: Internal Medicine

## 2014-09-15 ENCOUNTER — Other Ambulatory Visit: Payer: Self-pay | Admitting: Diagnostic Radiology

## 2014-09-15 DIAGNOSIS — R928 Other abnormal and inconclusive findings on diagnostic imaging of breast: Secondary | ICD-10-CM

## 2014-09-15 HISTORY — PX: BREAST BIOPSY: SHX20

## 2014-10-02 ENCOUNTER — Other Ambulatory Visit (INDEPENDENT_AMBULATORY_CARE_PROVIDER_SITE_OTHER): Payer: BC Managed Care – PPO | Admitting: *Deleted

## 2014-10-02 DIAGNOSIS — E785 Hyperlipidemia, unspecified: Secondary | ICD-10-CM

## 2014-10-02 LAB — LIPID PANEL
CHOLESTEROL: 125 mg/dL (ref 0–200)
HDL: 59.3 mg/dL (ref >39.00)
LDL CALC: 55 mg/dL (ref 0–99)
NonHDL: 65.7
TRIGLYCERIDES: 55 mg/dL (ref 0.0–149.0)
Total CHOL/HDL Ratio: 2
VLDL: 11 mg/dL (ref 0.0–40.0)

## 2014-10-02 LAB — ALT: ALT: 9 U/L (ref 0–35)

## 2014-10-06 ENCOUNTER — Telehealth: Payer: Self-pay

## 2014-10-06 DIAGNOSIS — E785 Hyperlipidemia, unspecified: Secondary | ICD-10-CM

## 2014-10-06 NOTE — Telephone Encounter (Signed)
called to give pt lab results.lmtcb 

## 2014-10-06 NOTE — Telephone Encounter (Signed)
Pt request lipid  results be mailed. done

## 2014-10-06 NOTE — Telephone Encounter (Signed)
-----   Message from Lesleigh NoeHenry W Smith III, MD sent at 10/02/2014  9:26 PM EST ----- At target. Repeat in 1 year.

## 2014-12-24 ENCOUNTER — Other Ambulatory Visit: Payer: Self-pay | Admitting: Unknown Physician Specialty

## 2014-12-24 DIAGNOSIS — N63 Unspecified lump in unspecified breast: Secondary | ICD-10-CM

## 2015-02-24 ENCOUNTER — Ambulatory Visit
Admission: RE | Admit: 2015-02-24 | Discharge: 2015-02-24 | Disposition: A | Discharge status: Home / Self Care | Payer: BC Managed Care – PPO | Source: Ambulatory Visit | Attending: Unknown Physician Specialty | Admitting: Unknown Physician Specialty

## 2015-02-24 ENCOUNTER — Ambulatory Visit: Payer: BC Managed Care – PPO

## 2015-02-24 DIAGNOSIS — N63 Unspecified lump in unspecified breast: Secondary | ICD-10-CM

## 2015-05-19 ENCOUNTER — Ambulatory Visit: Payer: BC Managed Care – PPO | Admitting: Interventional Cardiology

## 2015-05-31 ENCOUNTER — Other Ambulatory Visit: Payer: Self-pay | Admitting: Interventional Cardiology

## 2015-06-01 ENCOUNTER — Encounter: Payer: Self-pay | Admitting: Interventional Cardiology

## 2015-06-03 ENCOUNTER — Encounter: Payer: Self-pay | Admitting: Interventional Cardiology

## 2015-06-03 ENCOUNTER — Ambulatory Visit (INDEPENDENT_AMBULATORY_CARE_PROVIDER_SITE_OTHER): Payer: BC Managed Care – PPO | Admitting: Interventional Cardiology

## 2015-06-03 VITALS — BP 156/82 | Ht 63.0 in | Wt 153.1 lb

## 2015-06-03 DIAGNOSIS — I2510 Atherosclerotic heart disease of native coronary artery without angina pectoris: Secondary | ICD-10-CM

## 2015-06-03 DIAGNOSIS — E785 Hyperlipidemia, unspecified: Secondary | ICD-10-CM

## 2015-06-03 DIAGNOSIS — I6310 Cerebral infarction due to embolism of unspecified precerebral artery: Secondary | ICD-10-CM | POA: Diagnosis not present

## 2015-06-03 DIAGNOSIS — I517 Cardiomegaly: Secondary | ICD-10-CM

## 2015-06-03 NOTE — Progress Notes (Signed)
Cardiology Office Note   Date:  06/03/2015   ID:  Beth Christian, DOB [birth year], MRN [id number]  PCP:  Lillia Mountain, MD  Cardiologist:  [name], MD   Chief Complaint  Patient presents with  . Coronary Artery Disease      History of Present Illness: Beth Christian is a 64 y.o. female who presents for  Coronary artery disease, RCA DES 2, procedural related embolic CVA (no residual), an anxiety disorder.   Doing well. Denies angina. Has occasional anxiety episodes which she get short of breath. No obvious palpitations. No chest discomfort. 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    Past Medical History  Diagnosis Date  . Depression   . Hypothyroidism   . Tinnitus of both ears   . Urinary, incontinence, stress female   . Acrocyanosis (HCC)   . CVA (cerebral infarction)     occured during heart cath  . DJD (degenerative joint disease)   . Coronary artery disease involving native coronary artery 01/2011 and 06/2011    DES RCA x2 (most recently for ISR)    Past Surgical History  Procedure Laterality Date  . Thumb srugery  2004  . Chest tube for pneumonthorax after rib fracture    . Tonsillectomy      as child  . Coronary angioplasty with stent placement  2012  . Colonoscopy with propofol N/A 12/02/2012    Procedure: COLONOSCOPY WITH PROPOFOL;  Surgeon: Charolett Bumpers, MD;  Location: [facility] ENDOSCOPY;  Service: Endoscopy;  Laterality: N/A;  . Left heart catheterization with coronary angiogram N/A 07/07/2011    Procedure: LEFT HEART CATHETERIZATION WITH CORONARY ANGIOGRAM;  Surgeon: [name], MD;  Location[facility] C[facility] LAB;  Service: Cardiovascular;  Laterality: N/A;  Femoral  . Percutaneous coronary stent intervention (pci-s) N/A 07/07/2011    Procedure: PERCUTANEOUS CORONARY STENT INTERVENTION (PCI-S);  Surgeon: [name], MD;  Location: [facility] CATH LAB;  Service: Cardiovascular;   Laterality: N/A;     Current Outpatient Prescriptions  Medication Sig Dispense Refill  . aspirin EC 81 MG tablet Take 81 mg by mouth daily.    Marland Kitchen buPROPion (WELLBUTRIN XL) 150 MG 24 hr tablet Take 150 mg by mouth 3 (three) times daily.     . Cholecalciferol (VITAMIN D3 PO) Take 1 tablet by mouth daily.    . citalopram (CELEXA) 20 MG tablet Take 20 mg by mouth daily.      . clonazePAM (KLONOPIN) 0.5 MG tablet Take 0.5 mg by mouth at bedtime as needed. For sleep    . fish oil-omega-3 fatty acids 1000 MG capsule Take 1 g by mouth daily.     Marland Kitchen levothyroxine (SYNTHROID, LEVOTHROID) 75 MCG tablet Take 75 mcg by mouth daily.      . rosuvastatin (CRESTOR) 10 MG tablet TAKE 1 TABLET (10 MG TOTAL) BY MOUTH AT BEDTIME. 30 tablet 11   No current facility-administered medications for this visit.    Allergies:   Latex    Social History:  The patient  reports that she quit smoking about 35 years ago. She quit smokeless tobacco use about 31 years ago. She reports that she drinks about 1.0 oz of alcohol per week. She reports that she does not use illicit drugs.   Family History:  The patient's family history includes Coronary artery disease in her father; Hypertension in her mother; Kidney failure in her father.  ROS:  Please see the history of present illness.   Otherwise, review of systems are positive for  Anxiety , cough, hearing loss, difficulty with balance, and wheezing..   All other systems are reviewed and negative.    PHYSICAL EXAM: VS:  BP 156/82 mmHg  Ht 5\' 3"  (1.6 m)  Wt 69.456 kg (153 lb 2 oz)  BMI 27.13 kg/m2 , BMI Body mass index is 27.13 kg/(m^2). GEN: Well nourished, well developed, in no acute distress HEENT: normal Neck: no JVD, carotid bruits, or masses Cardiac: RRR.  There is no murmur, rub, or gallop. There is no edema. Respiratory:  clear to auscultation bilaterally, normal work of breathing. GI: soft, nontender, nondistended, + BS MS: no deformity or atrophy Skin:  warm and dry, no rash Neuro:  Strength and sensation are intact Psych: euthymic mood, full affect   EKG:  EKG is ordered today. The ekg reveals  Normal sinus rhythm, biatrial abnormality, left ventricular hypertrophy with secondary ST-T wave change.   Recent Labs: 10/02/2014: ALT 9    Lipid Panel    Component Value Date/Time   CHOL 125 10/02/2014 0801   TRIG 55.0 10/02/2014 0801   HDL 59.30 10/02/2014 0801   CHOLHDL 2 10/02/2014 0801   VLDL 11.0 10/02/2014 0801   LDLCALC 55 10/02/2014 0801      Wt Readings from Last 3 Encounters:  06/03/15 69.456 kg (153 lb 2 oz)  04/23/14 69.854 kg (154 lb)  12/02/12 64.411 kg (142 lb)      Other studies Reviewed: Additional studies/ records that were reviewed today include:  none. The findings include  Review lipids noted above.    ASSESSMENT AND PLAN:  1. Hyperlipidemia  at target on rosuvastatin  2. Coronary artery disease involving native coronary artery of native heart without angina pectoris  asymptomatic  3. Cerebral infarction due to embolism of precerebral artery (HCC)  complete resolution of diplopia related to embolic CVA  4. Left ventricular hypertrophy by electrocardiogram  needs further evaluation to exclude    Current medicines are reviewed at length with the patient today.  The patient has the following concerns regarding medicines: none.  The following changes/actions have been instituted:     2-D Doppler echocardiogram to exclude LVH  Labs/ tests ordered today include:  No orders of the defined types were placed in this encounter.     Disposition:   FU with HS in 1 year  Signed, Lesleigh NoeSMITH III,Cormick Moss W, MD  06/03/2015 3:53 PM    [facility] [address], [place], [place]  [address] Phone: [phone]; Fax: [phone]

## 2015-06-03 NOTE — Patient Instructions (Signed)
Medication Instructions:  Your physician recommends that you continue on your current medications as directed. Please refer to the Current Medication list given to you today.   Labwork: None ordered  Testing/Procedures: Your physician has requested that you have an echocardiogram. Echocardiography is a painless test that uses sound waves to create images of your heart. It provides your doctor with information about the size and shape of your heart and how well your heart's chambers and valves are working. This procedure takes approximately one hour. There are no restrictions for this procedure.   Follow-Up: Your physician wants you to follow-up in: 1 year with Dr.Smith You will receive a reminder letter in the mail two months in advance. If you don't receive a letter, please call our office to schedule the follow-up appointment.   Any Other Special Instructions Will Be Listed Below (If Applicable).   

## 2015-06-14 ENCOUNTER — Ambulatory Visit (HOSPITAL_COMMUNITY): Payer: BC Managed Care – PPO | Attending: Internal Medicine

## 2015-06-14 ENCOUNTER — Other Ambulatory Visit: Payer: Self-pay

## 2015-06-14 DIAGNOSIS — I517 Cardiomegaly: Secondary | ICD-10-CM

## 2015-06-14 DIAGNOSIS — I071 Rheumatic tricuspid insufficiency: Secondary | ICD-10-CM | POA: Diagnosis not present

## 2015-06-14 DIAGNOSIS — I358 Other nonrheumatic aortic valve disorders: Secondary | ICD-10-CM | POA: Diagnosis not present

## 2015-06-14 DIAGNOSIS — I5189 Other ill-defined heart diseases: Secondary | ICD-10-CM | POA: Insufficient documentation

## 2015-06-14 DIAGNOSIS — I351 Nonrheumatic aortic (valve) insufficiency: Secondary | ICD-10-CM | POA: Insufficient documentation

## 2015-06-14 DIAGNOSIS — I340 Nonrheumatic mitral (valve) insufficiency: Secondary | ICD-10-CM | POA: Diagnosis not present

## 2015-06-17 ENCOUNTER — Telehealth: Payer: Self-pay | Admitting: Interventional Cardiology

## 2015-06-17 NOTE — Telephone Encounter (Signed)
Returned pt call. Adv pt that Dr.Smith will be out of the office until next wk. Once he reviews her echo we will call back with the results. Pt verbalized understanding.

## 2015-06-17 NOTE — Telephone Encounter (Signed)
New message ° ° ° ° ° °Pt is calling to get echo results °

## 2015-06-23 ENCOUNTER — Telehealth: Payer: Self-pay

## 2015-06-23 DIAGNOSIS — I10 Essential (primary) hypertension: Secondary | ICD-10-CM

## 2015-06-23 NOTE — Telephone Encounter (Signed)
-----   Message from Lyn RecordsHenry W Smith, MD sent at 06/18/2015  2:10 PM EDT ----- Mild increase in LV thickness. Need to closely control BP. Start HCTZ 12.5 mg daily and get BMET 12 weeks. Needs OV for BP f/u with extender in 2-4 weeks.

## 2015-06-23 NOTE — Telephone Encounter (Signed)
Called to give pt echo results and Dr.Smith's recommendation.lmtcb 

## 2015-06-24 MED ORDER — HYDROCHLOROTHIAZIDE 12.5 MG PO CAPS: 12.5000 mg | ORAL_CAPSULE | Freq: Every day | ORAL | Status: DC

## 2015-06-24 NOTE — Telephone Encounter (Signed)
F/u    Pt's husband returning Lisa's phone call.

## 2015-06-24 NOTE — Telephone Encounter (Signed)
Pt has advance derivative on file. Pt husband of  echo results and Dr.Smith's recommendation. Mild increase in LV thickness. Need to closely control BP. Start HCTZ 12.5 mg daily and get BMET 12 weeks. Needs OV for BP f/u with extender in 2-4 weeks appt scheduled with Tereso NewcomerScott Weaver, PA for 12/5 @ 3:20 pm Lab appt scheduled for 09/24/15 (bmet) Rx for HCTZ 12.5mg  sent to pt pharmacy. Adv pt that pt monitor her bp regularly and call the office if bp is consistently elevated. Pt husband verbalized understanding to all info given.

## 2015-07-18 NOTE — Progress Notes (Signed)
Cardiology Office Note   Date:  07/19/2015   ID:  Beth Christian, DOB [birth year], MRN [id number]   Patient Care Team: Kirby Funk, MD as PCP - General (Internal Medicine) Lyn Records, MD as Consulting Physician (Cardiology)    Chief Complaint  Patient presents with  . Follow-up    LVH; Hypertension     History of Present Illness: Beth Christian is a 65 y.o. female with a hx of CAD s/p DES to RCA x 2 c/b peri-procedural CVA (no residual), anxiety.  Last seen by Dr. Verdis Prime 06/03/15.  LVH was noted on ECG.  Echo was arranged and demonstrated normal LV function with mild LVH. She was therefore started on HCTZ for strict blood pressure control. She returns for follow-up.  She is doing well.  The patient denies chest pain, shortness of breath, syncope, orthopnea, PND or significant pedal edema. She is tolerating HCTZ.     Studies/Reports Reviewed Today:  Echo 06/14/15 mild LVH, EF 60-65%, normal wall motion, grade 1 diastolic dysfunction, aortic sclerosis without stenosis, mild to moderate AI, MAC, mild MR, PASP 29 mmHg  Myoview 06/2011 IMPRESSION: There is a reversible defect involving the mid segment of the inferior and inferior septal wall. Findings are concerning for an area of pharmacologically induced ischemia. Normal left ventricular wall motion with calculated ejection fraction of 68%.  LHC 01/2011 LM:  20-40% LAD:  Mid 30-40% LCx:  Ok RCA:  prox 95%, mid 50%, PDA prox 50% EF 65% PCI:  3 x 16 mm Promus DES to proximal RCA   Past Medical History  Diagnosis Date  . Depression   . Hypothyroidism   . Tinnitus of both ears   . Urinary, incontinence, stress female   . Acrocyanosis (HCC)   . CVA (cerebral infarction)     occured during heart cath  . DJD (degenerative joint disease)   . Coronary artery disease involving native coronary artery 01/2011 and 06/2011    DES RCA x2 (most recently for ISR)    Past Surgical History  Procedure Laterality Date  .  Thumb srugery  2004  . Chest tube for pneumonthorax after rib fracture    . Tonsillectomy      as child  . Coronary angioplasty with stent placement  2012  . Colonoscopy with propofol N/A 12/02/2012    Procedure: COLONOSCOPY WITH PROPOFOL;  Surgeon: Charolett Bumpers, MD;  Location: [facility];  Service: Endoscopy;  Laterality: N/A;  . Left heart catheterization with coronary angiogram N/A 07/07/2011    Procedure: LEFT HEART CATHETERIZATION WITH CORONARY ANGIOGRAM;  Surgeon: [name], MD;  Location: [facility] CATH LAB;  Service: Cardiovascular;  Laterality: N/A;  Femoral  . Percutaneous coronary stent intervention (pci-s) N/A 07/07/2011    Procedure: PERCUTANEOUS CORONARY STENT INTERVENTION (PCI-S);  Surgeon: [name], MD;  Location: [facility] CATH LAB;  Service: Cardiovascular;  Laterality: N/A;     Current Outpatient Prescriptions  Medication Sig Dispense Refill  . aspirin EC 81 MG tablet Take 81 mg by mouth daily.    Marland Kitchen buPROPion (WELLBUTRIN XL) 150 MG 24 hr tablet Take 150 mg by mouth 3 (three) times daily.     . Cholecalciferol (VITAMIN D3 PO) Take 1 tablet by mouth daily.    . citalopram (CELEXA) 20 MG tablet Take 20 mg by mouth daily.      . clonazePAM (KLONOPIN) 0.5 MG tablet Take 0.5 mg by mouth at bedtime as needed. For sleep    .  fish oil-omega-3 fatty acids 1000 MG capsule Take 1 g by mouth daily.     . hydrochlorothiazide (MICROZIDE) 12.5 MG capsule Take 1 capsule (12.5 mg total) by mouth daily. 30 capsule 11  . levothyroxine (SYNTHROID, LEVOTHROID) 75 MCG tablet Take 75 mcg by mouth daily.      . rosuvastatin (CRESTOR) 10 MG tablet TAKE 1 TABLET (10 MG TOTAL) BY MOUTH AT BEDTIME. 30 tablet 11   No current facility-administered medications for this visit.    Allergies:   Latex    Social History:   Social History   Social History  . Marital Status: Married    Spouse Name: N/A  . Number of Children: N/A  . Years of Education: N/A   Social History Main Topics  .  Smoking status: Former Smoker -- 0.50 packs/day for 10 years    Quit date: 07/07/1979  . Smokeless tobacco: Former Neurosurgeon    Quit date: 02/13/1984  . Alcohol Use: 1.0 oz/week    2 drink(s) per week     Comment: drink 1-2  a week  . Drug Use: No  . Sexual Activity: Yes    Birth Control/ Protection: Post-menopausal   Other Topics Concern  . None   Social History Narrative     Family History:   Family History  Problem Relation Age of Onset  . Hypertension Mother   . Kidney failure Father     deceased  . Coronary artery disease Father       ROS:   Please see the history of present illness.   Review of Systems  Psychiatric/Behavioral: Positive for depression. The patient is nervous/anxious.   All other systems reviewed and are negative.     PHYSICAL EXAM: VS:  BP 144/72 mmHg  Pulse 65  Ht  (1.6 m)  Wt 153 lb 12.8 oz (69.763 kg)  BMI 27.25 kg/m2    Wt Readings from Last 3 Encounters:  07/19/15 153 lb 12.8 oz (69.763 kg)  06/03/15 153 lb 2 oz (69.456 kg)  04/23/14 154 lb (69.854 kg)     GEN: Well nourished, well developed, in no acute distress HEENT: normal Neck: no JVD,   no masses Cardiac:  Normal S1/S2, RRR; no murmur ,  no rubs or gallops, no edema   Respiratory:  clear to auscultation bilaterally, no wheezing, rhonchi or rales. GI: soft, nontender, nondistended, + BS MS: no deformity or atrophy Skin: warm and dry  Neuro:  CNs II-XII intact, Strength and sensation are intact Psych: Normal affect   EKG:  EKG is ordered today.  It demonstrates:  NSR, HR 65, normal axis, LVH, QTc 422 ms, no significant change when compared to prior tracings, PAC  Recent Labs: 10/02/2014: ALT 9    Lipid Panel    Component Value Date/Time   CHOL 125 10/02/2014 0801   TRIG 55.0 10/02/2014 0801   HDL 59.30 10/02/2014 0801   CHOLHDL 2 10/02/2014 0801   VLDL 11.0 10/02/2014 0801   LDLCALC 55 10/02/2014 0801      ASSESSMENT AND PLAN:  1. HTN:  Mild LVH on recent  Echo. BP is borderline today.  She checked it once at home and it was 138/80.  I have asked her to monitor her BP over the next 2 weeks and send me the readings.  If BP remains >/= 140/90, increase HCTZ to 25 mg QD.  Check BMET today.  Limit salt.  2. Hyperlipidemia:  Continue statin.  LDL in 2/16 was 55.  3. CAD:  Hx of RCA stent x 2 with peri-procedural CVA.  No angina.  Continue ASA, statin.  4. Aortic Insufficiency:  Mild to mod by recent echo.  No symptoms.  Continue to follow clinically and consider repeat Echo in 12-18 mos.     Medication Changes: Current medicines are reviewed at length with the patient today.  Concerns regarding medicines are as outlined above.  The following changes have been made:   Discontinued Medications   No medications on file   Modified Medications   No medications on file   New Prescriptions   No medications on file   Labs/ tests ordered today include:   Orders Placed This Encounter  Procedures  . Basic Metabolic Panel (BMET)  . EKG 12-Lead     Disposition:    FU with me in 3 mos.     Signed, Brynda RimScott Curry Seefeldt, PA-C, MHS 07/19/2015 4:21 PM    [facility] [address], [place], [place]  [address] Phone: [phone]; Fax: [phone]

## 2015-07-19 ENCOUNTER — Ambulatory Visit (INDEPENDENT_AMBULATORY_CARE_PROVIDER_SITE_OTHER): Payer: BC Managed Care – PPO | Admitting: Physician Assistant

## 2015-07-19 ENCOUNTER — Encounter: Payer: Self-pay | Admitting: Physician Assistant

## 2015-07-19 VITALS — BP 144/72 | HR 65 | Ht 63.0 in | Wt 153.8 lb

## 2015-07-19 DIAGNOSIS — I2510 Atherosclerotic heart disease of native coronary artery without angina pectoris: Secondary | ICD-10-CM | POA: Diagnosis not present

## 2015-07-19 DIAGNOSIS — I10 Essential (primary) hypertension: Secondary | ICD-10-CM | POA: Diagnosis not present

## 2015-07-19 DIAGNOSIS — I351 Nonrheumatic aortic (valve) insufficiency: Secondary | ICD-10-CM | POA: Diagnosis not present

## 2015-07-19 DIAGNOSIS — E785 Hyperlipidemia, unspecified: Secondary | ICD-10-CM

## 2015-07-19 LAB — BASIC METABOLIC PANEL
BUN: 14 mg/dL (ref 7–25)
CALCIUM: 8.8 mg/dL (ref 8.6–10.4)
CO2: 32 mmol/L — AB (ref 20–31)
CREATININE: 0.83 mg/dL (ref 0.50–0.99)
Chloride: 99 mmol/L (ref 98–110)
GLUCOSE: 77 mg/dL (ref 65–99)
Potassium: 4.1 mmol/L (ref 3.5–5.3)
SODIUM: 138 mmol/L (ref 135–146)

## 2015-07-19 NOTE — Patient Instructions (Signed)
Medication Instructions:  Your physician recommends that you continue on your current medications as directed. Please refer to the Current Medication list given to you today.   Labwork: 1. BMET  Testing/Procedures: NONE  Follow-Up: 10/18/15 @ 10:10 WITH SCOTT WEAVER, PAC  Any Other Special Instructions Will Be Listed Below (If Applicable). CHECK BP 4-5 TIMES A WEEK; AFTER 2 WEEKS CALL THE OFFICE WITH READINGS [phone]   If you need a refill on your cardiac medications before your next appointment, please call your pharmacy.

## 2015-07-21 ENCOUNTER — Telehealth: Payer: Self-pay | Admitting: *Deleted

## 2015-07-21 NOTE — Telephone Encounter (Signed)
Pt notified of lab results by phone with verbal understanding.  

## 2015-08-04 ENCOUNTER — Telehealth: Payer: Self-pay | Admitting: Physician Assistant

## 2015-08-04 DIAGNOSIS — I10 Essential (primary) hypertension: Secondary | ICD-10-CM

## 2015-08-04 MED ORDER — HYDROCHLOROTHIAZIDE 25 MG PO TABS: 25.0000 mg | ORAL_TABLET | Freq: Every day | ORAL | Status: DC

## 2015-08-04 NOTE — Telephone Encounter (Signed)
New message      Pt c/o BP issue: STAT if pt c/o blurred vision, one-sided weakness or slurred speech  1. What are your last 5 BP readings? 12-18 134/82 HR 68@7 :45am; 12-19 146/81 HR74@ 10:30am, 141/72 HR 84 12:45pm, 138/69 HR 81 @4 :15pm, 149/74 HR 66 @ 10:20pm; 12-20 143/80 HR 72 @4 :22pm, 134/67 HR 67 @5 :50pm; 12-21 134/58 HR 72 @ 10:05am  2. Are you having any other symptoms (ex. Dizziness, headache, blurred vision, passed out)?  no  3. What is your BP issue?  Calling to give bp readings

## 2015-08-04 NOTE — Telephone Encounter (Signed)
Pt notified of recommendations per Bing NeighborsScott W. PA due to BP readings from today to increase HCTZ to 25 mg daily, bmet 12/27 that she will have done with PCP w/results faxed to PA [phone]. Pt agreeable to plan of care.

## 2015-08-04 NOTE — Telephone Encounter (Signed)
Forwarded to Tereso NewcomerScott Weaver, PA for further review and recommendations

## 2015-08-04 NOTE — Telephone Encounter (Signed)
Borderline BP control Increase HCTZ to 25 mg QD BMET 1 week Tereso NewcomerScott Carl Bleecker, PA-C   08/04/2015 5:01 PM

## 2015-09-22 ENCOUNTER — Other Ambulatory Visit (INDEPENDENT_AMBULATORY_CARE_PROVIDER_SITE_OTHER): Payer: BC Managed Care – PPO

## 2015-09-22 DIAGNOSIS — I10 Essential (primary) hypertension: Secondary | ICD-10-CM | POA: Diagnosis not present

## 2015-09-22 LAB — BASIC METABOLIC PANEL
BUN: 18 mg/dL (ref 7–25)
CHLORIDE: 103 mmol/L (ref 98–110)
CO2: 30 mmol/L (ref 20–31)
Calcium: 8.7 mg/dL (ref 8.6–10.4)
Creat: 1.07 mg/dL — ABNORMAL HIGH (ref 0.50–0.99)
Glucose, Bld: 80 mg/dL (ref 65–99)
POTASSIUM: 4.9 mmol/L (ref 3.5–5.3)
Sodium: 139 mmol/L (ref 135–146)

## 2015-09-24 ENCOUNTER — Other Ambulatory Visit: Payer: BC Managed Care – PPO

## 2015-10-17 NOTE — Progress Notes (Signed)
Cardiology Office Note:    Date:  10/18/2015   ID:  Beth Christian, DOB 10/19/1950, MRN [id number]  PCP:  [name],[name], MD  Cardiologist:  Dr. Verdis PrimeHenry Smith   Electrophysiologist:  n/a  Chief Complaint  Patient presents with  . Follow-up    Hypertension    History of Present Illness:     Beth CorneaLinda S Christian is a 65 y.o. female with a hx of CAD s/p DES to RCA x 2 c/b peri-procedural CVA (no residual), anxiety. Last seen by Dr. Verdis PrimeHenry Smith 06/03/15. LVH was noted on ECG. Echo was arranged and demonstrated normal LV function with mild LVH. She was therefore started on HCTZ for strict blood pressure control.  I saw her in FU 12/16.  We continued to monitor her BP.   BP at home remained too high and HCTZ was increased to 25 QD.  She returns for FU.  Since last seen, she is doing well. The patient denies chest pain, shortness of breath, syncope, orthopnea, PND or significant pedal edema. She is tolerating HCTZ.     Past Medical History  Diagnosis Date  . Depression   . Hypothyroidism   . Tinnitus of both ears   . Urinary, incontinence, stress female   . Acrocyanosis (HCC)   . CVA (cerebral infarction)     occured during heart cath  . DJD (degenerative joint disease)   . Coronary artery disease involving native coronary artery 01/2011 and 06/2011    DES RCA x2 (most recently for ISR)    Past Surgical History  Procedure Laterality Date  . Thumb srugery  2004  . Chest tube for pneumonthorax after rib fracture    . Tonsillectomy      as child  . Coronary angioplasty with stent placement  2012  . Colonoscopy with propofol N/A 12/02/2012    Procedure: COLONOSCOPY WITH PROPOFOL;  Surgeon: Charolett BumpersMartin K Johnson, MD;  Location: [facility];  Service: Endoscopy;  Laterality: N/A;  . Left heart catheterization with coronary angiogram N/A 07/07/2011    Procedure: LEFT HEART CATHETERIZATION WITH CORONARY ANGIOGRAM;  Surgeon: Lesleigh NoeHenry W Smith III, MD;  [facility];  Service:  Cardiovascular;  Laterality: N/A;  Femoral  . Percutaneous coronary stent intervention (pci-s) N/A 07/07/2011    Procedure: PERCUTANEOUS CORONARY STENT INTERVENTION (PCI-S);  Surgeon: Lesleigh NoeHenry W Smith III, MD;  [facility];  Service: Cardiovascular;  Laterality: N/A;    Current Medications: Outpatient Prescriptions Prior to Visit  Medication Sig Dispense Refill  . aspirin EC 81 MG tablet Take 81 mg by mouth daily.    Marland Kitchen. buPROPion (WELLBUTRIN XL) 150 MG 24 hr tablet Take 150 mg by mouth 3 (three) times daily.     . Cholecalciferol (VITAMIN D3 PO) Take 1 tablet by mouth daily.    . citalopram (CELEXA) 20 MG tablet Take 20 mg by mouth daily.      . clonazePAM (KLONOPIN) 0.5 MG tablet Take 0.5 mg by mouth at bedtime as needed. For sleep    . fish oil-omega-3 fatty acids 1000 MG capsule Take 1 g by mouth daily.     . hydrochlorothiazide (HYDRODIURIL) 25 MG tablet Take 1 tablet (25 mg total) by mouth daily. 90 tablet 3  . levothyroxine (SYNTHROID, LEVOTHROID) 75 MCG tablet Take 75 mcg by mouth daily.      . rosuvastatin (CRESTOR) 10 MG tablet TAKE 1 TABLET (10 MG TOTAL) BY MOUTH AT BEDTIME. 30 tablet 11   No facility-administered medications prior to visit.  Allergies:   Latex   Social History   Social History  . Marital Status: Married    Spouse Name: N/A  . Number of Children: N/A  . Years of Education: N/A   Social History Main Topics  . Smoking status: Former Smoker -- 0.50 packs/day for 10 years    Quit date: 07/07/1979  . Smokeless tobacco: Former Neurosurgeon    Quit date: 02/13/1984  . Alcohol Use: 1.0 oz/week    2 drink(s) per week     Comment: drink 1-2  a week  . Drug Use: No  . Sexual Activity: Yes    Birth Control/ Protection: Post-menopausal   Other Topics Concern  . None   Social History Narrative     Family History:  The patient's family history includes Coronary artery disease in her father; Hypertension in her mother; Kidney failure in her father.    ROS:   Please see the history of present illness.    Review of Systems  Respiratory: Positive for snoring.   Neurological: Positive for loss of balance.  All other systems reviewed and are negative.   Physical Exam:    VS:  BP 140/60 mmHg  Pulse 68  Ht  (1.6 m)  Wt 162 lb 12.8 oz (73.846 kg)  BMI 28.85 kg/m2   GEN: Well nourished, well developed, in no acute distress HEENT: normal Neck: no JVD, no masses Cardiac: Normal S1/S2, RRR; no murmurs,   no edema;    Respiratory:  clear to auscultation bilaterally; no wheezing, rhonchi or rales GI: soft, nontender  MS: no deformity or atrophy Skin: warm and dry  Neuro: no focal deficits  Psych: Alert and oriented x 3, normal affect   Wt Readings from Last 3 Encounters:  10/18/15 162 lb 12.8 oz (73.846 kg)  07/19/15 153 lb 12.8 oz (69.763 kg)  06/03/15 153 lb 2 oz (69.456 kg)      Studies/Labs Reviewed:     EKG:  EKG is  ordered today.  The ekg ordered today demonstrates NSR, HR 68, normal axis, LVH, QTc 446 ms, no change from prior tracing  Recent Labs: 09/22/2015: BUN 18; Creat 1.07*; Potassium 4.9; Sodium 139   Recent Lipid Panel    Component Value Date/Time   CHOL 125 10/02/2014 0801   TRIG 55.0 10/02/2014 0801   HDL 59.30 10/02/2014 0801   CHOLHDL 2 10/02/2014 0801   VLDL 11.0 10/02/2014 0801   LDLCALC 55 10/02/2014 0801    Additional studies/ records that were reviewed today include:   Echo 06/14/15  mild LVH, EF 60-65%, normal wall motion, grade 1 diastolic dysfunction, aortic sclerosis without stenosis, mild to moderate AI, MAC, mild MR, PASP 29 mmHg  Myoview 06/2011 IMPRESSION: There is a reversible defect involving the mid segment of the inferior and inferior septal wall. Findings are concerning for an area of pharmacologically induced ischemia. Normal left ventricular wall motion with calculated ejection fraction of 68%.  LHC 01/2011 LM: 20-40% LAD: Mid 30-40% LCx: Ok RCA: prox 95%, mid  50%, PDA prox 50% EF 65% PCI: 3 x 16 mm Promus DES to proximal RCA   ASSESSMENT:     1. Essential hypertension   2. Coronary artery disease involving native coronary artery of native heart without angina pectoris     PLAN:     In order of problems listed above:  1. HTN: Mild LVH on recent Echo.  BP averages 130s systolic at home. Continue HCTZ to 25 mg QD. She plans to  start exercising and following a stricter diet. Hopefully, BP will improve with weight loss and increased activity.     2. CAD: Hx of RCA stent x 2 with peri-procedural CVA. No angina. Continue ASA, statin. Last LDL 55 in 2/16.     Medication Adjustments/Labs and Tests Ordered: Current medicines are reviewed at length with the patient today.  Concerns regarding medicines are outlined above.  Medication changes, Labs and Tests ordered today are outlined in the Patient Instructions noted below. Patient Instructions  Medication Instructions:  Your physician recommends that you continue on your current medications as directed. Please refer to the Current Medication list given to you today.  Labwork: NONE  Testing/Procedures: NONE  Follow-Up: Your physician wants you to follow-up in: 1 YEAR WITH DR. Katrinka Blazing You will receive a reminder letter in the mail two months in advance. If you don't receive a letter, please call our office to schedule the follow-up appointment.  Any Other Special Instructions Will Be Listed Below (If Applicable).  If you need a refill on your cardiac medications before your next appointment, please call your pharmacy.   Signed, Tereso Newcomer, PA-C  10/18/2015 11:14 AM    [facility] [address] [place], [place], [place]  [address] Phone: [phone]; Fax: [phone]

## 2015-10-18 ENCOUNTER — Encounter: Payer: Self-pay | Admitting: Physician Assistant

## 2015-10-18 ENCOUNTER — Ambulatory Visit (INDEPENDENT_AMBULATORY_CARE_PROVIDER_SITE_OTHER): Payer: BC Managed Care – PPO | Admitting: Physician Assistant

## 2015-10-18 VITALS — BP 140/60 | HR 68 | Ht 63.0 in | Wt 162.8 lb

## 2015-10-18 DIAGNOSIS — E785 Hyperlipidemia, unspecified: Secondary | ICD-10-CM | POA: Diagnosis not present

## 2015-10-18 DIAGNOSIS — I351 Nonrheumatic aortic (valve) insufficiency: Secondary | ICD-10-CM | POA: Diagnosis not present

## 2015-10-18 DIAGNOSIS — I2510 Atherosclerotic heart disease of native coronary artery without angina pectoris: Secondary | ICD-10-CM

## 2015-10-18 DIAGNOSIS — I10 Essential (primary) hypertension: Secondary | ICD-10-CM

## 2015-10-18 NOTE — Patient Instructions (Addendum)
Medication Instructions:  Your physician recommends that you continue on your current medications as directed. Please refer to the Current Medication list given to you today.  Labwork: NONE  Testing/Procedures: NONE  Follow-Up: Your physician wants you to follow-up in: 1 YEAR WITH DR. Katrinka BlazingSMITH You will receive a reminder letter in the mail two months in advance. If you don't receive a letter, please call our office to schedule the follow-up appointment.  Any Other Special Instructions Will Be Listed Below (If Applicable).  If you need a refill on your cardiac medications before your next appointment, please call your pharmacy.

## 2016-05-02 ENCOUNTER — Other Ambulatory Visit: Payer: Self-pay | Admitting: Internal Medicine

## 2016-05-02 DIAGNOSIS — R270 Ataxia, unspecified: Secondary | ICD-10-CM

## 2016-05-02 DIAGNOSIS — I6350 Cerebral infarction due to unspecified occlusion or stenosis of unspecified cerebral artery: Secondary | ICD-10-CM

## 2016-05-09 ENCOUNTER — Ambulatory Visit
Admission: RE | Admit: 2016-05-09 | Discharge: 2016-05-09 | Disposition: A | Discharge status: Home / Self Care | Payer: BC Managed Care – PPO | Source: Ambulatory Visit | Attending: Internal Medicine | Admitting: Internal Medicine

## 2016-05-09 DIAGNOSIS — R270 Ataxia, unspecified: Secondary | ICD-10-CM

## 2016-05-09 DIAGNOSIS — I6350 Cerebral infarction due to unspecified occlusion or stenosis of unspecified cerebral artery: Secondary | ICD-10-CM

## 2016-06-01 NOTE — Progress Notes (Signed)
Beth Christian was seen today in neurologic consultation at the request of Lillia Mountain, MD.  The consultation is for the evaluation of gait change.   This patient is accompanied in the office by her husband who supplements the history.  Pt states that he has noted balance issues for about a year; however, he states that the first time she fell was 3 years ago she "stumbled" and broke a bone in her wrist.  Then just recently she was moving a box in the living room and fell and broke a bone in her other wrist.  No LE paresthesias.  No DM.  Had a stroke during a cardiac stent in 2012 and ended up with diplopia for 6 months.  None since that time.  Good control over bladder and bowel.    Neuroimaging has previously been performed.  It is available for my review today.  I reviewed this with the patient and her husband.  I also compared it to her 2012 examination.  There is evidence of cerebral white matter disease.  A few do appear to line up perpendicular to the ventricle, but most are scattered throughout.  ALLERGIES:   Allergies  Allergen Reactions  . Latex Rash    CURRENT MEDICATIONS:  Outpatient Encounter Prescriptions as of 06/02/2016  Medication Sig  . aspirin EC 81 MG tablet Take 81 mg by mouth daily.  Marland Kitchen buPROPion (WELLBUTRIN XL) 150 MG 24 hr tablet Take 150 mg by mouth 3 (three) times daily.   . Cholecalciferol (VITAMIN D3 PO) Take 1 tablet by mouth daily.  . citalopram (CELEXA) 20 MG tablet Take 20 mg by mouth daily.    . clonazePAM (KLONOPIN) 0.5 MG tablet Take 0.5 mg by mouth at bedtime as needed. For sleep  . fish oil-omega-3 fatty acids 1000 MG capsule Take 1 g by mouth daily.   . fluticasone (FLONASE) 50 MCG/ACT nasal spray Place into both nostrils daily.  Chilton Si Tea, Camillia sinensis, (GREEN TEA PO) Take 400 mcg by mouth daily.  . hydrochlorothiazide (HYDRODIURIL) 25 MG tablet Take 1 tablet (25 mg total) by mouth daily.  Marland Kitchen levothyroxine (SYNTHROID, LEVOTHROID) 75 MCG  tablet Take 75 mcg by mouth daily.    . rosuvastatin (CRESTOR) 10 MG tablet TAKE 1 TABLET (10 MG TOTAL) BY MOUTH AT BEDTIME.   No facility-administered encounter medications on file as of 06/02/2016.     PAST MEDICAL HISTORY:   Past Medical History:  Diagnosis Date  . Acrocyanosis (HCC)   . Coronary artery disease involving native coronary artery 01/2011 and 06/2011   DES RCA x2 (most recently for ISR)  . CVA (cerebral infarction)    occured during heart cath  . Depression   . DJD (degenerative joint disease)   . HTN (hypertension)   . Hypothyroidism   . Raynaud's disease   . RLS (restless legs syndrome)   . Tinnitus of both ears   . Urinary, incontinence, stress female     PAST SURGICAL HISTORY:   Past Surgical History:  Procedure Laterality Date  . chest tube for pneumonthorax after rib fracture    . COLONOSCOPY WITH PROPOFOL N/A 12/02/2012   Procedure: COLONOSCOPY WITH PROPOFOL;  Surgeon: Charolett Bumpers, MD;  Location: [facility];  Service: Endoscopy;  Laterality: N/A;  . CORONARY ANGIOPLASTY WITH STENT PLACEMENT  2012  . LEFT HEART CATHETERIZATION WITH CORONARY ANGIOGRAM N/A 07/07/2011   Procedure: LEFT HEART CATHETERIZATION WITH CORONARY ANGIOGRAM;  Surgeon: [name], MD;  Location:  [facility] CATH LAB;  Service: Cardiovascular;  Laterality: N/A;  Femoral  . PERCUTANEOUS CORONARY STENT INTERVENTION (PCI-S) N/A 07/07/2011   Procedure: PERCUTANEOUS CORONARY STENT INTERVENTION (PCI-S);  Surgeon: [name], MD;  Location[facility] CATH LAB;  Service: Cardiovascular;  Laterality: N/A;  . thumb srugery  2004  . TONSILLECTOMY     as child  . WRIST SURGERY      SOCIAL HISTORY:   Social History   Social History  . Marital status: Married    Spouse name: N/A  . Number of children: N/A  . Years of education: N/A   Occupational History  . retired     Development worker, community, school   Social History Main Topics  . Smoking status: Former Smoker    Packs/day: 0.50    Years:  10.00    Quit date: 07/07/1979  . Smokeless tobacco: Former Neurosurgeon    Quit date: 02/13/1984  . Alcohol use 1.0 oz/week    2 Standard drinks or equivalent per week     Comment: 1 every 2 weeks  . Drug use: No  . Sexual activity: Yes    Birth control/ protection: Post-menopausal   Other Topics Concern  . Not on file   Social History Narrative  . No narrative on file    FAMILY HISTORY:   Family Status  Relation Status  . Mother Alive  . Father Deceased  . Sister Alive  . Daughter Alive   one with kidney transplant    ROS:  A complete 10 system review of systems was obtained and was unremarkable apart from what is mentioned above.  PHYSICAL EXAMINATION:    VITALS:   Vitals:   06/02/16 1343  BP: 110/60  Pulse: 68  Weight: 163 lb (73.9 kg)  Height: 5\' 3"  (1.6 m)    GEN:  Normal appears female in no acute distress.  Appears stated age. HEENT:  Normocephalic, atraumatic. The mucous membranes are moist. The superficial temporal arteries are without ropiness or tenderness. Cardiovascular: Regular rate and rhythm. Lungs: Clear to auscultation bilaterally. Neck/Heme: There are no carotid bruits noted bilaterally. Derm:  Toes and plantar aspect of feet are purplish hue.  NEUROLOGICAL: Orientation:  The patient is alert and oriented x 3.  Fund of knowledge is appropriate.  Recent and remote memory intact.  Attention span and concentration normal.  Repeats and names without difficulty. Cranial nerves: There is good facial symmetry. The pupils are equal round and reactive to light bilaterally. Fundoscopic exam reveals clear disc margins bilaterally. Extraocular muscles are intact and visual fields are full to confrontational testing. Speech is fluent and clear. Soft palate rises symmetrically and there is no tongue deviation. Hearing is intact to conversational tone. Tone: Tone is good throughout. Sensation: Sensation is intact to light touch and pinprick throughout (facial, trunk,  extremities). Vibration is intact at the bilateral big toe. There is no extinction with double simultaneous stimulation. There is no sensory dermatomal level identified. Coordination:  The patient has no difficulty with RAM's or FNF bilaterally. Motor: Strength is 5/5 in the bilateral upper and lower extremities.  Shoulder shrug is equal and symmetric. There is no pronator drift.  There are no fasciculations noted. DTR's: Deep tendon reflexes are 3+/4 at the bilateral biceps, triceps, brachioradialis, patella and achilles.  Plantar responses are downgoing bilaterally. Gait and Station: The patient is able to ambulate without difficulty.  She is unable to ambulate in a tandem fashion.   IMPRESSION/PLAN  1.  Gait instability in the  face of significant hyperreflexia.  -I told her that the hyperreflexia may be physiologic in nature, but we will go ahead and proceed with an MRI of the cervical spine to make sure we are not missing anything.  -I reviewed her MRI of the brain with her.  The number of T2 hyperintensities has progressed since 2012 somewhat, and this likely represents cerebral small vessel disease from history of tobacco use and HTN.  I think that demyelinating disease would be unlikely in her age group.  I talked to them about the possibility of doing a lumbar puncture, but also told them that I thought this would be very low yield.  We will see what the cervical spine shows first.  She is on ASA.  -could consider EMG if sx's persist if no other etiology found.    -will do MRA neck, given hx of CAD and prior stroke 2.  F/u depending on results of above testing.  Safety discussed.  Discussed weight loss, diet, exercise, control of HTN and stroke/CV risk factors.  Much greater than 50% of this visit was spent in counseling and coordinating care.  Total face to face time:  60 min    Cc:  [name],[name], MD

## 2016-06-02 ENCOUNTER — Ambulatory Visit (INDEPENDENT_AMBULATORY_CARE_PROVIDER_SITE_OTHER): Payer: BC Managed Care – PPO | Admitting: Neurology

## 2016-06-02 ENCOUNTER — Encounter: Payer: Self-pay | Admitting: Neurology

## 2016-06-02 VITALS — BP 110/60 | HR 68 | Ht 63.0 in | Wt 163.0 lb

## 2016-06-02 DIAGNOSIS — Z8673 Personal history of transient ischemic attack (TIA), and cerebral infarction without residual deficits: Secondary | ICD-10-CM | POA: Diagnosis not present

## 2016-06-02 DIAGNOSIS — R292 Abnormal reflex: Secondary | ICD-10-CM

## 2016-06-02 DIAGNOSIS — R270 Ataxia, unspecified: Secondary | ICD-10-CM | POA: Diagnosis not present

## 2016-06-02 NOTE — Patient Instructions (Signed)
We have sent a referral to [facility] for your MRI and they will call you directly to schedule your appt. They are located at [address] If you need to contact them directly please call [phone].

## 2016-06-02 NOTE — Addendum Note (Signed)
Addended bySilvio Pate: MCCRACKEN, JADE L on: 06/02/2016 03:41 PM   Modules accepted: Orders

## 2016-06-11 ENCOUNTER — Ambulatory Visit
Admission: RE | Admit: 2016-06-11 | Discharge: 2016-06-11 | Disposition: A | Discharge status: Home / Self Care | Payer: BC Managed Care – PPO | Source: Ambulatory Visit | Attending: Neurology | Admitting: Neurology

## 2016-06-11 ENCOUNTER — Other Ambulatory Visit: Payer: BC Managed Care – PPO

## 2016-06-11 DIAGNOSIS — R270 Ataxia, unspecified: Secondary | ICD-10-CM

## 2016-06-11 DIAGNOSIS — Z8673 Personal history of transient ischemic attack (TIA), and cerebral infarction without residual deficits: Secondary | ICD-10-CM

## 2016-06-11 DIAGNOSIS — R292 Abnormal reflex: Secondary | ICD-10-CM

## 2016-06-11 MED ORDER — GADOBENATE DIMEGLUMINE 529 MG/ML IV SOLN
15.0000 mL | Freq: Once | INTRAVENOUS | Status: AC | PRN
  Administered 2016-06-11: 15 mL via INTRAVENOUS

## 2016-06-12 ENCOUNTER — Telehealth: Payer: Self-pay | Admitting: Neurology

## 2016-06-12 DIAGNOSIS — R296 Repeated falls: Secondary | ICD-10-CM

## 2016-06-12 DIAGNOSIS — R2681 Unsteadiness on feet: Secondary | ICD-10-CM

## 2016-06-12 NOTE — Telephone Encounter (Signed)
Patient made aware. She is okay to proceed with EMG. Order entered and given to front desk to schedule.

## 2016-06-12 NOTE — Telephone Encounter (Signed)
Patient's husband will have patient call back.

## 2016-06-12 NOTE — Telephone Encounter (Signed)
-----   Message from Octaviano Battyebecca S Tat, DO sent at 06/12/2016  7:36 AM EDT ----- Tell pt MRI cervical spine showed some degenerative/arthritic changes but nothing surgical.  Has a benign nerve root cyst that I am happy to show her pics of if she wants but no tx needed for that.  MRA - carotids are widely patent.  Vertebral artery on L just bit narrow but she is already on ASA.  Again, happy to show her.  Doesn't explain falls.  Not sure we will get answer but told her we could proceed with EMG if she would like.

## 2016-06-13 ENCOUNTER — Ambulatory Visit (INDEPENDENT_AMBULATORY_CARE_PROVIDER_SITE_OTHER): Payer: BC Managed Care – PPO | Admitting: Neurology

## 2016-06-13 DIAGNOSIS — R296 Repeated falls: Secondary | ICD-10-CM | POA: Diagnosis not present

## 2016-06-13 DIAGNOSIS — R2681 Unsteadiness on feet: Secondary | ICD-10-CM

## 2016-06-13 NOTE — Procedures (Signed)
[facility]  [address], [address]  [place], [place] [address] Tel: [phone] Fax:  [phone] Test Date:  06/13/2016  Patient: Beth NajjarLinda Christian DOB: 06/30/1951 Physician: Nita Sickleonika Lynnwood Beckford, DO  Sex: Female Height: 5\' 3"  Ref Phys: Kerin Salenebecca Tat, D.O.  ID#: [id number] Temp: 33.5C Technician: Judie PetitM. Dean   Patient Complaints: This is a 65 year old female referred for evaluation of gait imbalance.   NCV & EMG Findings: Extensive electrodiagnostic testing of the right lower extremity and additional studies of the left shows:  1. Bilateral sural and superficial peroneal sensory responses are within normal limits. 2. Left peroneal and bilateral tibial motor responses are within normal limits. Right peroneal motor nerve shows reduced amplitude (1.7 mV) at the extensor digitorum brevis muscle, however when recording at the tibialis anterior, the motor amplitude is within normal limits; in isolation, these findings are nonspecific and of unclear clinical significance.  3. Bilateral tibial H reflex studies are within normal limits. 4. There is no evidence of active or chronic motor axon loss changes affecting any of the tested muscles.  Motor unit configuration and recruitment pattern is within normal limits.   Impression: This is a normal study of the lower extremities. In particular, there is no evidence of a sensorimotor polyneuropathy or lumbosacral radiculopathy.   ___________________________ Nita Sickleonika Jalexus Brett, DO    Nerve Conduction Studies Anti Sensory Summary Table   Stim Site NR Peak (ms) Norm Peak (ms) P-T Amp (V) Norm P-T Amp  Left Sup Peroneal Anti Sensory (Ant Lat Mall)  33.5C  12 cm    2.7 <4.6 13.0 >3  Right Sup Peroneal Anti Sensory (Ant Lat Mall)  33.5C  12 cm    2.3 <4.6 13.8 >3  Left Sural Anti Sensory (Lat Mall)  33.5C  Calf    3.2 <4.6 15.5 >3  Right Sural Anti Sensory (Lat Mall)  33.5C  Calf    3.3 <4.6 9.5 >3   Motor Summary Table   Stim Site NR Onset  (ms) Norm Onset (ms) O-P Amp (mV) Norm O-P Amp Site1 Site2 Delta-0 (ms) Dist (cm) Vel (m/s) Norm Vel (m/s)  Left Peroneal Motor (Ext Dig Brev)  33.5C  Ankle    4.1 <6.0 5.2 >2.5 B Fib Ankle 5.7 30.0 53 >40  B Fib    9.8  4.2  Poplt B Fib 2.0 10.0 50 >40  Poplt    11.8  3.9         Right Peroneal Motor (Ext Dig Brev)  33.5C  Ankle    5.4 <6.0 1.7 >2.5 B Fib Ankle 6.5 32.0 49 >40  B Fib    11.9  1.5  Poplt B Fib 1.9 10.0 53 >40  Poplt    13.8  1.4         Right Peroneal TA Motor (Tib Ant)  33.5C  Fib Head    2.0 <4.5 4.9 >3 Poplit Fib Head 1.9 10.0 53 >40  Poplit    3.9  4.2         Left Tibial Motor (Abd Hall Brev)  33.5C  Ankle    3.4 <6.0 7.4 >4 Knee Ankle 6.8 36.0 53 >40  Knee    10.2  6.1         Right Tibial Motor (Abd Hall Brev)  33.5C  Ankle    3.3 <6.0 7.3 >4 Knee Ankle 7.6 35.0 46 >40  Knee    10.9  5.8          H Reflex  Studies   NR H-Lat (ms) Lat Norm (ms) L-R H-Lat (ms) M-Lat (ms) HLat-MLat (ms)  Left Tibial (Gastroc)  33.5C     31.70 <35 0.00 4.08 27.62  Right Tibial (Gastroc)  33.5C     31.70 <35 0.00 4.08 27.62   EMG   Side Muscle Ins Act Fibs Psw Fasc Number Recrt Dur Dur. Amp Amp. Poly Poly. Comment  Right AntTibialis Nml Nml Nml Nml Nml Nml Nml Nml Nml Nml Nml Nml N/A  Right Gastroc Nml Nml Nml Nml Nml Nml Nml Nml Nml Nml Nml Nml N/A  Right Flex Dig Long Nml Nml Nml Nml Nml Nml Nml Nml Nml Nml Nml Nml N/A  Right RectFemoris Nml Nml Nml Nml Nml Nml Nml Nml Nml Nml Nml Nml N/A  Right GluteusMed Nml Nml Nml Nml Nml Nml Nml Nml Nml Nml Nml Nml N/A  Right BicepsFemS Nml Nml Nml Nml Nml Nml Nml Nml Nml Nml Nml Nml N/A  Left AntTibialis Nml Nml Nml Nml Nml Nml Nml Nml Nml Nml Nml Nml N/A  Left Gastroc Nml Nml Nml Nml Nml Nml Nml Nml Nml Nml Nml Nml N/A  Left RectFemoris Nml Nml Nml Nml Nml Nml Nml Nml Nml Nml Nml Nml N/A      Waveforms:

## 2016-06-15 ENCOUNTER — Encounter: Payer: Self-pay | Admitting: Interventional Cardiology

## 2016-06-19 ENCOUNTER — Ambulatory Visit: Payer: BC Managed Care – PPO | Admitting: Neurology

## 2016-06-23 ENCOUNTER — Other Ambulatory Visit: Payer: Self-pay | Admitting: Interventional Cardiology

## 2016-06-30 ENCOUNTER — Encounter: Payer: Self-pay | Admitting: Interventional Cardiology

## 2016-06-30 ENCOUNTER — Encounter (INDEPENDENT_AMBULATORY_CARE_PROVIDER_SITE_OTHER): Payer: Self-pay

## 2016-06-30 ENCOUNTER — Ambulatory Visit (INDEPENDENT_AMBULATORY_CARE_PROVIDER_SITE_OTHER): Payer: Medicare Other | Admitting: Interventional Cardiology

## 2016-06-30 VITALS — BP 160/86 | HR 78 | Ht 63.0 in | Wt 165.0 lb

## 2016-06-30 DIAGNOSIS — I10 Essential (primary) hypertension: Secondary | ICD-10-CM | POA: Insufficient documentation

## 2016-06-30 DIAGNOSIS — I679 Cerebrovascular disease, unspecified: Secondary | ICD-10-CM | POA: Diagnosis not present

## 2016-06-30 DIAGNOSIS — I2510 Atherosclerotic heart disease of native coronary artery without angina pectoris: Secondary | ICD-10-CM

## 2016-06-30 DIAGNOSIS — E785 Hyperlipidemia, unspecified: Secondary | ICD-10-CM | POA: Diagnosis not present

## 2016-06-30 DIAGNOSIS — I6789 Other cerebrovascular disease: Secondary | ICD-10-CM | POA: Insufficient documentation

## 2016-06-30 MED ORDER — AMLODIPINE BESYLATE 2.5 MG PO TABS: 2.5000 mg | ORAL_TABLET | Freq: Every day | ORAL | 6 refills | Status: DC

## 2016-06-30 NOTE — Progress Notes (Signed)
Cardiology Office Note    Date:  06/30/2016   ID:  Beth Christian, DOB Sep 12, 1950, MRN [id number]  PCP:  Lillia Mountain, MD  Cardiologist: [name], MD   Chief Complaint  Patient presents with  . Coronary Artery Disease    History of Present Illness:  Beth Christian is a 65 y.o. female with a hx of CAD s/p DES to RCA x 2 c/b peri-procedural CVA (no residual), anxiety. Last seen by Dr. Verdis Prime 06/03/15. LVH was noted on ECG. Echo was arranged and demonstrated normal LV function with mild LVH. She was therefore started on HCTZ for strict blood pressure control.  I saw her in FU 12/16.   She is gaining weight. She has been falling. She saw neuro and she has evidence of microvascular disease. She is not had angina. She denies shortness of breath. No palpitations or syncope. She has gained weight.   Past Medical History:  Diagnosis Date  . Acrocyanosis (HCC)   . Coronary artery disease involving native coronary artery 01/2011 and 06/2011   DES RCA x2 (most recently for ISR)  . CVA (cerebral infarction)    occured during heart cath  . Depression   . DJD (degenerative joint disease)   . HTN (hypertension)   . Hypothyroidism   . Raynaud's disease   . RLS (restless legs syndrome)   . Tinnitus of both ears   . Urinary, incontinence, stress female     Past Surgical History:  Procedure Laterality Date  . chest tube for pneumonthorax after rib fracture    . COLONOSCOPY WITH PROPOFOL N/A 12/02/2012   Procedure: COLONOSCOPY WITH PROPOFOL;  Surgeon: Charolett Bumpers, MD;  Location: [facility];  Service: Endoscopy;  Laterality: N/A;  . CORONARY ANGIOPLASTY WITH STENT PLACEMENT  2012  . LEFT HEART CATHETERIZATION WITH CORONARY ANGIOGRAM N/A 07/07/2011   Procedure: LEFT HEART CATHETERIZATION WITH CORONARY ANGIOGRAM;  Surgeon: [name], MD;  Location: [facility] CATH LAB;  Service: Cardiovascular;  Laterality: N/A;  Femoral  . PERCUTANEOUS CORONARY STENT  INTERVENTION (PCI-S) N/A 07/07/2011   Procedure: PERCUTANEOUS CORONARY STENT INTERVENTION (PCI-S);  Surgeon: [name], MD;  Location: [facility] CATH LAB;  Service: Cardiovascular;  Laterality: N/A;  . thumb srugery  2004  . TONSILLECTOMY     as child  . WRIST SURGERY      Current Medications: Outpatient Medications Prior to Visit  Medication Sig Dispense Refill  . aspirin EC 81 MG tablet Take 81 mg by mouth daily.    Marland Kitchen buPROPion (WELLBUTRIN XL) 150 MG 24 hr tablet Take 150 mg by mouth 3 (three) times daily.     . citalopram (CELEXA) 20 MG tablet Take 20 mg by mouth daily.      . fluticasone (FLONASE) 50 MCG/ACT nasal spray Place into both nostrils daily.    . hydrochlorothiazide (HYDRODIURIL) 25 MG tablet Take 1 tablet (25 mg total) by mouth daily. 90 tablet 3  . levothyroxine (SYNTHROID, LEVOTHROID) 75 MCG tablet Take 75 mcg by mouth daily.      . rosuvastatin (CRESTOR) 10 MG tablet TAKE 1 TABLET (10 MG TOTAL) BY MOUTH AT BEDTIME. 30 tablet 2  . Cholecalciferol (VITAMIN D3 PO) Take 1 tablet by mouth daily.    . clonazePAM (KLONOPIN) 0.5 MG tablet Take 0.5 mg by mouth at bedtime as needed. For sleep    . fish oil-omega-3 fatty acids 1000 MG capsule Take 1 g by mouth daily.     Marland Kitchen  Green Tea, Camillia sinensis, (GREEN TEA PO) Take 400 mcg by mouth daily.     No facility-administered medications prior to visit.      Allergies:   Latex   Social History   Social History  . Marital status: Married    Spouse name: N/A  . Number of children: N/A  . Years of education: N/A   Occupational History  . 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 Smoking status: Former Smoker    Packs/day: 0.50    Years: 10.00    Quit date: 07/07/1979  . Smokeless tobacco: Former NeurosurgeonUser    Quit date: 02/13/1984  . Alcohol use 1.0 oz/week    2 Standard drinks or equivalent per week     Comment: 1 every 2 weeks  . Drug use: No  . Sexual activity: Yes    Birth control/ protection:  Post-menopausal   Other Topics Concern  . None   Social History Narrative  . None     Family History:  The patient's family history includes Breast cancer in her mother; Coronary artery disease in her father; Hypertension in her mother; Kidney failure in her father; Lymphoma in her mother; Throat cancer in her sister.   ROS:   Please see the history of present illness.    Anxiety, difficulty with balance, depression.  All other systems reviewed and are negative.   PHYSICAL EXAM:   VS:  BP (!) 160/86   Pulse 78   Ht 5\' 3"  (1.6 m)   Wt 165 lb (74.8 kg)   BMI 29.23 kg/m    GEN: Well nourished, well developed, in no acute distress  HEENT: normal  Neck: no JVD, carotid bruits, or masses Cardiac: RRR; no murmurs, rubs, or gallops,no edema  Respiratory:  clear to auscultation bilaterally, normal work of breathing GI: soft, nontender, nondistended, + BS MS: no deformity or atrophy  Skin: warm and dry, no rash Neuro:  Alert and Oriented x 3, Strength and sensation are intact Psych: euthymic mood, full affect  Wt Readings from Last 3 Encounters:  06/30/16 165 lb (74.8 kg)  06/02/16 163 lb (73.9 kg)  10/18/15 162 lb 12.8 oz (73.8 kg)      Studies/Labs Reviewed:   EKG:  EKG  Not repeated  Recent Labs: 09/22/2015: BUN 18; Creat 1.07; Potassium 4.9; Sodium 139   Lipid Panel    Component Value Date/Time   CHOL 125 10/02/2014 0801   TRIG 55.0 10/02/2014 0801   HDL 59.30 10/02/2014 0801   CHOLHDL 2 10/02/2014 0801   VLDL 11.0 10/02/2014 0801   LDLCALC 55 10/02/2014 0801    Additional studies/ records that were reviewed today include:  Review the MRI and MRA done earlier this year which documented both intracranial arterial disease also microvascular diffuse brain damage.    ASSESSMENT:    1. Coronary artery disease involving native coronary artery of native heart without angina pectoris   2. Essential hypertension   3. Cerebral microvascular disease   4.  Hyperlipidemia LDL goal <70      PLAN:  In order of problems listed above:  1. Continue lipid management. Aerobic exercise and blood pressure control also important. 2. Blood pressure was significantly elevated upon arrival today. Add amlodipine 2.5 mg per day. Low-salt diet. Return in 2-3 weeks for follow-up with APAP. 3. This was documented on MR I. More aggressive risk factor modification may help. Therefore we have added amlodipine 2 achieve blood  pressure in the 130/85 range. 4. LDL is less than 70. No changes made in therapy.    Medication Adjustments/Labs and Tests Ordered: Current medicines are reviewed at length with the patient today.  Concerns regarding medicines are outlined above.  Medication changes, Labs and Tests ordered today are listed in the Patient Instructions below. Patient Instructions  Medication Instructions:  Your physician has recommended you make the following change in your medication:  Start amlodipine 2.5 mg by mouth daily.   Labwork: none  Testing/Procedures: none  Follow-Up: Your physician recommends that you schedule a follow-up appointment in: 3-4 weeks with NP or PA  Your physician wants you to follow-up in: 12 months with Dr. Marlou StarksSmith You will receive a reminder letter in the mail two months in advance. If you don't receive a letter, please call our office to schedule the follow-up appointment.   Any Other Special Instructions Will Be Listed Below (If Applicable).     If you need a refill on your cardiac medications before your next appointment, please call your pharmacy.      Signed, Lesleigh NoeHenry W Smith III, MD  06/30/2016 4:54 PM    [facility] [address], [place], [place]  [address] Phone: [phone](336) [phone]; Fax: [phone](336) [phone]

## 2016-06-30 NOTE — Patient Instructions (Signed)
Medication Instructions:  Your physician has recommended you make the following change in your medication:  Start amlodipine 2.5 mg by mouth daily.   Labwork: none  Testing/Procedures: none  Follow-Up: Your physician recommends that you schedule a follow-up appointment in: 3-4 weeks with NP or PA  Your physician wants you to follow-up in: 12 months with Dr. Marlou StarksSmith You will receive a reminder letter in the mail two months in advance. If you don't receive a letter, please call our office to schedule the follow-up appointment.   Any Other Special Instructions Will Be Listed Below (If Applicable).     If you need a refill on your cardiac medications before your next appointment, please call your pharmacy.

## 2016-07-05 ENCOUNTER — Ambulatory Visit: Payer: BC Managed Care – PPO | Admitting: Interventional Cardiology

## 2016-07-19 NOTE — Progress Notes (Signed)
Cardiology Office Note   Date:  07/20/2016   ID:  Beth Christian, DOB 10/20/1950, MRN [id number]  PCP:  Beth Christian,Beth JOSEPH, MD  Cardiologist:  Dr. Katrinka Christian    Chief Complaint  Patient presents with  . Hypertension      History of Present Illness: Beth Christian is a 65 y.o. female who presents for HTN  Follow up after adding amlodipine to medications.  She has a hx of CAD s/p DES to RCA x 2 c/b peri-procedural CVA (no residual), anxiety. Last seen by Dr. Verdis PrimeHenry Christian 06/30/16 LVH was noted on ECG. Previous Echo 2016 demonstrated normal LV function with mild LVH. She was therefore started on HCTZ for strict blood pressure control. BP elevated on last visit amlodipine added.  And low salt diet added.    Today she is trying to adjust diet and her BP initially elevated but after 5 min BP was 130/70.  At home she states it is 128/70 at times.   No chest pain or SOB.  Past Medical History:  Diagnosis Date  . Acrocyanosis (HCC)   . Coronary artery disease involving native coronary artery 01/2011 and 06/2011   DES RCA x2 (most recently for ISR)  . CVA (cerebral infarction)    occured during heart cath  . Depression   . DJD (degenerative joint disease)   . HTN (hypertension)   . Hypothyroidism   . Raynaud's disease   . RLS (restless legs syndrome)   . Tinnitus of both ears   . Urinary, incontinence, stress female     Past Surgical History:  Procedure Laterality Date  . chest tube for pneumonthorax after rib fracture    . COLONOSCOPY WITH PROPOFOL N/A 12/02/2012   Procedure: COLONOSCOPY WITH PROPOFOL;  Surgeon: Charolett BumpersMartin K Johnson, MD;  Location: [facility];  Service: Endoscopy;  Laterality: N/A;  . CORONARY ANGIOPLASTY WITH STENT PLACEMENT  2012  . LEFT HEART CATHETERIZATION WITH CORONARY ANGIOGRAM N/A 07/07/2011   Procedure: LEFT HEART CATHETERIZATION WITH CORONARY ANGIOGRAM;  Surgeon: Beth NoeHenry W Christian III, MD;  [facility];  Service: Cardiovascular;  Laterality: N/A;   Femoral  . PERCUTANEOUS CORONARY STENT INTERVENTION (PCI-S) N/A 07/07/2011   Procedure: PERCUTANEOUS CORONARY STENT INTERVENTION (PCI-S);  Surgeon: Beth NoeHenry W Christian III, MD;  [facility];  Service: Cardiovascular;  Laterality: N/A;  . thumb srugery  2004  . TONSILLECTOMY     as child  . WRIST SURGERY       Current Outpatient Prescriptions  Medication Sig Dispense Refill  . amLODipine (NORVASC) 2.5 MG tablet Take 1 tablet (2.5 mg total) by mouth daily. 30 tablet 6  . aspirin EC 81 MG tablet Take 81 mg by mouth daily.    Marland Kitchen. buPROPion (WELLBUTRIN XL) 150 MG 24 hr tablet Take 150 mg by mouth 3 (three) times daily.     . citalopram (CELEXA) 20 MG tablet Take 20 mg by mouth daily.      . clonazePAM (KLONOPIN) 0.5 MG tablet Take 0.5 mg by mouth at bedtime.  5  . fluticasone (FLONASE) 50 MCG/ACT nasal spray Place into both nostrils daily.    . hydrochlorothiazide (HYDRODIURIL) 25 MG tablet Take 1 tablet (25 mg total) by mouth daily. 90 tablet 3  . levothyroxine (SYNTHROID, LEVOTHROID) 75 MCG tablet Take 75 mcg by mouth daily.      . rosuvastatin (CRESTOR) 10 MG tablet TAKE 1 TABLET (10 MG TOTAL) BY MOUTH AT BEDTIME. 30 tablet 2   No current facility-administered  medications for this visit.     Allergies:   Latex    Social History:  The patient  reports that she quit smoking about 37 years ago. She has a 5.00 pack-year smoking history. She quit smokeless tobacco use about 32 years ago. She reports that she drinks about 1.0 oz of alcohol per week . She reports that she does not use drugs.   Family History:  The patient's family history includes Breast cancer in her mother; Coronary artery disease in her father; Hypertension in her mother; Kidney failure in her father; Lymphoma in her mother; Throat cancer in her sister.    ROS:  General:no colds or fevers, no weight changes CV:see HPI PUL:see HPI Neuro:no syncope, no lightheadedness   Wt Readings from Last 3 Encounters:  07/20/16  165 lb 6.4 oz (75 kg)  06/30/16 165 lb (74.8 kg)  06/02/16 163 lb (73.9 kg)     PHYSICAL EXAM: VS:  BP (!) 150/66 (BP Location: Right Arm)   Pulse 71   Ht 5\' 3"  (1.6 m)   Wt 165 lb 6.4 oz (75 kg)   BMI 29.30 kg/m  , BMI Body mass index is 29.3 kg/m. General:Pleasant affect, NAD Skin:Warm and dry, brisk capillary refill Heart:S1S2 RRR without murmur, gallup, rub or click Lungs:clear without rales, rhonchi, or wheezes ZOX:WRUE, non tender, + BS, do not palpate liver spleen or masses Ext:no lower ext edema Neuro:alert and oriented, MAE, follows commands, + facial symmetry  BP recheck 130/70  EKG:  EKG is NOT ordered today.   Recent Labs: 09/22/2015: BUN 18; Creat 1.07; Potassium 4.9; Sodium 139    Lipid Panel    Component Value Date/Time   CHOL 125 10/02/2014 0801   TRIG 55.0 10/02/2014 0801   HDL 59.30 10/02/2014 0801   CHOLHDL 2 10/02/2014 0801   VLDL 11.0 10/02/2014 0801   LDLCALC 55 10/02/2014 0801       Other studies Reviewed: Additional studies/ records that were reviewed today include: . ECHO: Study Conclusions  - Left ventricle: The cavity size was normal. Wall thickness was   increased in a pattern of mild LVH. Systolic function was normal.   The estimated ejection fraction was in the range of 60% to 65%.   Wall motion was normal; there were no regional wall motion   abnormalities. Doppler parameters are consistent with abnormal   left ventricular relaxation (grade 1 diastolic dysfunction). The   E/e&' ratio is between 8-15, suggesting indeterminate LV filling   pressure. - Aortic valve: Sclerosis without stenosis. There was mild to   moderate regurgitation. Regurgitation pressure half-time: 374 ms. - Mitral valve: Calcified annulus. There was mild regurgitation. - Left atrium: The atrium was normal in size. - Tricuspid valve: There was trivial regurgitation. - Pulmonary arteries: PA peak pressure: 29 mm Hg (S). - Inferior vena cava: The vessel was  normal in size. The   respirophasic diameter changes were in the normal range (= 50%),   consistent with normal central venous pressure.  Impressions:  - LVEF 60-65%, mild LVH, normal wall motion, diastolic dysfunction,   indeterminate LV filling pressure, aortic sclerosis with mild to   moderate AI, mild MR, trivial TR, normal RVSP.  ASSESSMENT AND PLAN: 1. CAD with hx of stent.  No chest pain.  Continue lipid management. Aerobic exercise and blood pressure control also important. Discussed exercise again today.  2. Blood pressure was improved but elevated upon arrival today. amlodipine 2.5 mg is helping Low-salt diet.  She is  instructed to call if BP is staying > 145 systolic.  Follow up with Dr. Katrinka Christian in 1 year.  3. Cerebral microvascular disease  This was documented on MR I. More aggressive risk factor modification may help. Therefore we have added amlodipine 2 achieve blood pressure in the 130/85 range.   4. LDL is less than 70. No changes made in therapy.    Current medicines are reviewed with the patient today.  The patient Has no concerns regarding medicines.  The following changes have been made:  See above Labs/ tests ordered today include:see above  Disposition:   FU:  see above  Signed, Nada BoozerLaura Monish Haliburton, NP  07/20/2016 11:25 AM    [facility] [address], [place], [place]  [address]/ [address] [place], [place] Phone: [phone]; Fax: [phone]  [phone]

## 2016-07-20 ENCOUNTER — Encounter (INDEPENDENT_AMBULATORY_CARE_PROVIDER_SITE_OTHER): Payer: Self-pay

## 2016-07-20 ENCOUNTER — Encounter: Payer: Self-pay | Admitting: Cardiology

## 2016-07-20 ENCOUNTER — Ambulatory Visit (INDEPENDENT_AMBULATORY_CARE_PROVIDER_SITE_OTHER): Payer: Medicare Other | Admitting: Cardiology

## 2016-07-20 VITALS — BP 150/66 | HR 71 | Ht 63.0 in | Wt 165.4 lb

## 2016-07-20 DIAGNOSIS — I2510 Atherosclerotic heart disease of native coronary artery without angina pectoris: Secondary | ICD-10-CM | POA: Diagnosis not present

## 2016-07-20 DIAGNOSIS — I10 Essential (primary) hypertension: Secondary | ICD-10-CM | POA: Diagnosis not present

## 2016-07-20 DIAGNOSIS — E781 Pure hyperglyceridemia: Secondary | ICD-10-CM

## 2016-07-20 DIAGNOSIS — I679 Cerebrovascular disease, unspecified: Secondary | ICD-10-CM

## 2016-07-20 DIAGNOSIS — I6789 Other cerebrovascular disease: Secondary | ICD-10-CM

## 2016-07-20 NOTE — Patient Instructions (Addendum)
Medication Instructions:  Your physician recommends that you continue on your current medications as directed. Please refer to the Current Medication list given to you today.  Labwork: NONE  Testing/Procedures: NONE  Follow-Up: Your physician wants you to follow-up in: 12 months with Dr. Katrinka BlazingSmith. You will receive a reminder letter in the mail two months in advance. If you don't receive a letter, please call our office to schedule the follow-up appointment.  If your systolic blood pressure (SBP) becomes higher than 145 call for medication adjustment.  If you need a refill on your cardiac medications before your next appointment, please call your pharmacy.

## 2016-07-21 ENCOUNTER — Ambulatory Visit: Payer: Medicare Other | Admitting: Cardiology

## 2016-08-24 ENCOUNTER — Other Ambulatory Visit: Payer: Self-pay | Admitting: Interventional Cardiology

## 2016-09-18 ENCOUNTER — Other Ambulatory Visit: Payer: Self-pay | Admitting: Interventional Cardiology

## 2016-09-20 NOTE — Telephone Encounter (Signed)
Medication Detail    Disp Refills Start End   rosuvastatin (CRESTOR) 10 MG tablet 30 tablet 11 08/24/2016    Sig: TAKE 1 TABLET (10 MG TOTAL) BY MOUTH AT BEDTIME.   E-Prescribing Status: Receipt confirmed by pharmacy (08/24/2016 5:01 PM EST)   Pharmacy   [id number] - [place], [place] - [address]

## 2016-09-26 ENCOUNTER — Other Ambulatory Visit: Payer: Self-pay | Admitting: Interventional Cardiology

## 2016-09-26 NOTE — Telephone Encounter (Signed)
Medication Detail    Disp Refills Start End   rosuvastatin (CRESTOR) 10 MG tablet 30 tablet 11 08/24/2016    Sig: TAKE 1 TABLET (10 MG TOTAL) BY MOUTH AT BEDTIME.   E-Prescribing Status: Receipt confirmed by pharmacy (08/24/2016 5:01 PM EST)   Pharmacy   [id number] - [place], [place] - [address]    

## 2016-09-27 NOTE — Telephone Encounter (Signed)
Medication Detail    Disp Refills Start End   rosuvastatin (CRESTOR) 10 MG tablet 30 tablet 11 08/24/2016    Sig: TAKE 1 TABLET (10 MG TOTAL) BY MOUTH AT BEDTIME.   E-Prescribing Status: Receipt confirmed by pharmacy (08/24/2016 5:01 PM EST)   Pharmacy   [id number] - [place], [place] - [address]    

## 2016-10-03 ENCOUNTER — Other Ambulatory Visit: Payer: Self-pay | Admitting: Interventional Cardiology

## 2016-10-04 NOTE — Telephone Encounter (Signed)
Medication Detail    Disp Refills Start End   rosuvastatin (CRESTOR) 10 MG tablet 30 tablet 11 08/24/2016    Sig: TAKE 1 TABLET (10 MG TOTAL) BY MOUTH AT BEDTIME.   E-Prescribing Status: Receipt confirmed by pharmacy (08/24/2016 5:01 PM EST)   Pharmacy   [id number] - [place], [place] - [address]

## 2016-10-19 ENCOUNTER — Other Ambulatory Visit: Payer: Self-pay | Admitting: Physician Assistant

## 2016-12-12 DIAGNOSIS — M75102 Unspecified rotator cuff tear or rupture of left shoulder, not specified as traumatic: Secondary | ICD-10-CM

## 2016-12-12 DIAGNOSIS — M24112 Other articular cartilage disorders, left shoulder: Secondary | ICD-10-CM

## 2016-12-12 HISTORY — DX: Other articular cartilage disorders, left shoulder: M24.112

## 2016-12-12 HISTORY — DX: Unspecified rotator cuff tear or rupture of left shoulder, not specified as traumatic: M75.102

## 2016-12-19 ENCOUNTER — Telehealth: Payer: Self-pay | Admitting: Interventional Cardiology

## 2016-12-19 NOTE — Telephone Encounter (Signed)
Walk In pt Form-Murphy Wainer Clearance dropped off placed in [employer] Doc Box.

## 2016-12-25 ENCOUNTER — Telehealth: Payer: Self-pay | Admitting: Interventional Cardiology

## 2016-12-25 NOTE — Telephone Encounter (Signed)
Cleared for upcoming surgery. Aspirin can be held for 3 days if needed.

## 2016-12-25 NOTE — Telephone Encounter (Signed)
Request for surgical clearance:  1. What type of surgery is being performed?  Left shoulder scope, manipulation, rotator cuff repair   2. When is this surgery scheduled?  Pending   3. Are there any medications that need to be held prior to surgery and how long?  ASA if needed  4. Name of physician performing surgery?  Dr. Eulah PontMurphy   5. What is your office phone and fax number?  Delbert HarnessMurphy Wainer [phone] Jethro BolusATTN Sherri

## 2016-12-27 NOTE — Telephone Encounter (Signed)
Sent to requesting office 

## 2017-01-04 ENCOUNTER — Encounter (HOSPITAL_BASED_OUTPATIENT_CLINIC_OR_DEPARTMENT_OTHER): Payer: Self-pay | Admitting: *Deleted

## 2017-01-04 NOTE — Pre-Procedure Instructions (Signed)
To come for BMET and EKG 

## 2017-01-05 ENCOUNTER — Encounter (HOSPITAL_BASED_OUTPATIENT_CLINIC_OR_DEPARTMENT_OTHER)
Admission: RE | Admit: 2017-01-05 | Discharge: 2017-01-05 | Disposition: A | Discharge status: Home / Self Care | Payer: Medicare Other | Source: Ambulatory Visit | Attending: Orthopedic Surgery | Admitting: Orthopedic Surgery

## 2017-01-05 DIAGNOSIS — Z01812 Encounter for preprocedural laboratory examination: Secondary | ICD-10-CM | POA: Insufficient documentation

## 2017-01-05 DIAGNOSIS — Z01810 Encounter for preprocedural cardiovascular examination: Secondary | ICD-10-CM | POA: Diagnosis present

## 2017-01-05 DIAGNOSIS — M19012 Primary osteoarthritis, left shoulder: Secondary | ICD-10-CM | POA: Diagnosis not present

## 2017-01-05 LAB — BASIC METABOLIC PANEL
Anion gap: 7 (ref 5–15)
BUN: 15 mg/dL (ref 6–20)
CO2: 31 mmol/L (ref 22–32)
Calcium: 9 mg/dL (ref 8.9–10.3)
Chloride: 102 mmol/L (ref 101–111)
Creatinine, Ser: 1 mg/dL (ref 0.44–1.00)
GFR calc Af Amer: >60 mL/min (ref >60)
GFR, EST NON AFRICAN AMERICAN: 58 mL/min — AB (ref >60)
Glucose, Bld: 79 mg/dL (ref 65–99)
POTASSIUM: 5.5 mmol/L — AB (ref 3.5–5.1)
Sodium: 140 mmol/L (ref 135–145)

## 2017-01-05 NOTE — Pre-Procedure Instructions (Signed)
Dr. Michelle Piperssey notified of K+ 5.5 - repeat DOS.

## 2017-01-09 NOTE — Progress Notes (Signed)
Notified Sherri at D.Murphy's office of pt's K+ 5.5.

## 2017-01-09 NOTE — H&P (Signed)
Beth Christian is seen in consultation today at the request of Dr. Alfonso Ramus.  Continued significant symptoms, left shoulder.  Sustained a traumatic event with left wrist fracture last summer.  Treated closed.  Went on to heal.  Ever since then she has had issues with her shoulder.  Now that her wrist has healed the shoulder has been more paramount.  Symptomatic for six months.  Getting worse over the last two months.  Steadily losing motion.  Difficulty going behind her back and bringing her arm overhead.  More and more functional impact.  This is despite efforts at exercise, range of motion and strengthening.  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  I reviewed x-rays from April of 2018 showing no superior migration.  Type I-II acromion.  Moderate changes AC joint.  No fractures.  MRI scan completed this month reveals a picture of posttraumatic tendinopathy, supraspinatus more than infraspinatus.  No evidence of full thickness tears.  No atrophy or abnormal uptake.  Biceps intact.  Some changes in the [facility] joint with fluid and spurs.   I met at length with her and her husband today.  Previous history reviewed, updated and included in the chart.  Still complaining of a little soreness in her wrist.    EXAMINATION: On her exam she has about 60% active and passive motion on the left and this is all painful, especially when she approaches end points.  Positive impingement.  Positive palms down abduction.  There is no change in the color, warmth or sweating of this arm, although she occasionally has some dysesthesias that go down the arm.  No noticeable atrophy.  Absolutely full motion of her opposite right shoulder.  Left wrist has no deformity.  A little soreness distal radius and RU joint.  Her motion is good, lacking just a little supination.  Neurovascularly intact.    X-RAYS: Repeat final x-rays of her wrist show her fracture to be healed.  She has a little dorsal tilt from  displacement of initial fracture.  Nothing else untoward.  Degree of shortening not too marked.    DISPOSITION:  I talked at length with Beth Christian and her husband.  She is getting worse rather than better.  We have discussed definitive treatment.  Given how long this is going on and the fact that she is getting worse, I really don't think that further therapy or subacromial injection is going to make a big difference and both of them completely understand and agree.  We have discussed exam under anesthesia with manipulation.  Arthroscopy, lysis of adhesions with subacromial decompression and distal clavicle excision.  Repair whatever else I find, but I am not anticipating finding any cuff tears.  Procedure, risks, benefits and complications reviewed.  Paperwork complete.  All questions answered.  She is going to have to begin aggressive therapy the next day.  How long it is going to take to get better is going to depend a fair amount on how stuck she is when I examine her when she is asleep.  I will see her at the time of operative intervention.

## 2017-01-11 ENCOUNTER — Encounter (HOSPITAL_BASED_OUTPATIENT_CLINIC_OR_DEPARTMENT_OTHER): Payer: Self-pay | Admitting: Anesthesiology

## 2017-01-11 ENCOUNTER — Ambulatory Visit (HOSPITAL_BASED_OUTPATIENT_CLINIC_OR_DEPARTMENT_OTHER): Payer: Medicare Other | Admitting: Anesthesiology

## 2017-01-11 ENCOUNTER — Encounter (HOSPITAL_BASED_OUTPATIENT_CLINIC_OR_DEPARTMENT_OTHER)
Admission: RE | Disposition: A | Discharge status: Home / Self Care | Payer: Self-pay | Source: Ambulatory Visit | Attending: Orthopedic Surgery

## 2017-01-11 ENCOUNTER — Ambulatory Visit (HOSPITAL_BASED_OUTPATIENT_CLINIC_OR_DEPARTMENT_OTHER)
Admission: RE | Admit: 2017-01-11 | Discharge: 2017-01-11 | Disposition: A | Discharge status: Home / Self Care | Payer: Medicare Other | Source: Ambulatory Visit | Attending: Orthopedic Surgery | Admitting: Orthopedic Surgery

## 2017-01-11 DIAGNOSIS — F329 Major depressive disorder, single episode, unspecified: Secondary | ICD-10-CM | POA: Diagnosis not present

## 2017-01-11 DIAGNOSIS — Z87891 Personal history of nicotine dependence: Secondary | ICD-10-CM | POA: Diagnosis not present

## 2017-01-11 DIAGNOSIS — M19012 Primary osteoarthritis, left shoulder: Secondary | ICD-10-CM | POA: Insufficient documentation

## 2017-01-11 DIAGNOSIS — G2581 Restless legs syndrome: Secondary | ICD-10-CM | POA: Insufficient documentation

## 2017-01-11 DIAGNOSIS — Z79899 Other long term (current) drug therapy: Secondary | ICD-10-CM | POA: Diagnosis not present

## 2017-01-11 DIAGNOSIS — I10 Essential (primary) hypertension: Secondary | ICD-10-CM | POA: Diagnosis not present

## 2017-01-11 DIAGNOSIS — Z7982 Long term (current) use of aspirin: Secondary | ICD-10-CM | POA: Diagnosis not present

## 2017-01-11 DIAGNOSIS — E785 Hyperlipidemia, unspecified: Secondary | ICD-10-CM | POA: Diagnosis not present

## 2017-01-11 DIAGNOSIS — I2510 Atherosclerotic heart disease of native coronary artery without angina pectoris: Secondary | ICD-10-CM | POA: Diagnosis not present

## 2017-01-11 DIAGNOSIS — M7502 Adhesive capsulitis of left shoulder: Secondary | ICD-10-CM | POA: Diagnosis not present

## 2017-01-11 DIAGNOSIS — M7542 Impingement syndrome of left shoulder: Secondary | ICD-10-CM | POA: Insufficient documentation

## 2017-01-11 DIAGNOSIS — E039 Hypothyroidism, unspecified: Secondary | ICD-10-CM | POA: Diagnosis not present

## 2017-01-11 HISTORY — DX: Presence of external hearing-aid: Z97.4

## 2017-01-11 HISTORY — DX: Unspecified temporomandibular joint disorder, unspecified side: M26.609

## 2017-01-11 HISTORY — PX: SHOULDER CLOSED REDUCTION: SHX1051

## 2017-01-11 HISTORY — DX: Primary osteoarthritis, unspecified shoulder: M19.019

## 2017-01-11 HISTORY — DX: Personal history of transient ischemic attack (TIA), and cerebral infarction without residual deficits: Z86.73

## 2017-01-11 HISTORY — DX: Unspecified cataract: H26.9

## 2017-01-11 HISTORY — DX: Other somatoform disorders: F45.8

## 2017-01-11 HISTORY — DX: Unspecified rotator cuff tear or rupture of left shoulder, not specified as traumatic: M75.102

## 2017-01-11 HISTORY — DX: Other articular cartilage disorders, left shoulder: M24.112

## 2017-01-11 LAB — POCT I-STAT, CHEM 8
BUN: 19 mg/dL (ref 6–20)
CALCIUM ION: 1.1 mmol/L — AB (ref 1.15–1.40)
CHLORIDE: 99 mmol/L — AB (ref 101–111)
Creatinine, Ser: 1 mg/dL (ref 0.44–1.00)
GLUCOSE: 76 mg/dL (ref 65–99)
HCT: 39 % (ref 36.0–46.0)
Hemoglobin: 13.3 g/dL (ref 12.0–15.0)
POTASSIUM: 3.3 mmol/L — AB (ref 3.5–5.1)
Sodium: 142 mmol/L (ref 135–145)
TCO2: 31 mmol/L (ref 0–100)

## 2017-01-11 SURGERY — SHOULDER ARTHROSCOPY WITH SUBACROMIAL DECOMPRESSION AND DISTAL CLAVICLE EXCISION
Anesthesia: General | Site: Shoulder | Laterality: Left

## 2017-01-11 MED ORDER — FENTANYL CITRATE (PF) 100 MCG/2ML IJ SOLN
INTRAMUSCULAR | Status: AC
  Filled 2017-01-11: qty 2

## 2017-01-11 MED ORDER — CEFAZOLIN SODIUM-DEXTROSE 2-4 GM/100ML-% IV SOLN
INTRAVENOUS | Status: AC
  Filled 2017-01-11: qty 100

## 2017-01-11 MED ORDER — CEFAZOLIN SODIUM-DEXTROSE 2-4 GM/100ML-% IV SOLN
2.0000 g | INTRAVENOUS | Status: AC
  Administered 2017-01-11: 2 g via INTRAVENOUS

## 2017-01-11 MED ORDER — BUPIVACAINE-EPINEPHRINE (PF) 0.5% -1:200000 IJ SOLN
INTRAMUSCULAR | Status: DC | PRN
  Administered 2017-01-11: 30 mL via PERINEURAL

## 2017-01-11 MED ORDER — MIDAZOLAM HCL 2 MG/2ML IJ SOLN
INTRAMUSCULAR | Status: AC
  Filled 2017-01-11: qty 2

## 2017-01-11 MED ORDER — SODIUM CHLORIDE 0.9 % IR SOLN
Status: DC | PRN
  Administered 2017-01-11: 9000 mL

## 2017-01-11 MED ORDER — ONDANSETRON HCL 4 MG PO TABS: 4.0000 mg | ORAL_TABLET | Freq: Three times a day (TID) | ORAL | 0 refills | Status: DC | PRN

## 2017-01-11 MED ORDER — FENTANYL CITRATE (PF) 100 MCG/2ML IJ SOLN
50.0000 ug | INTRAMUSCULAR | Status: DC | PRN
  Administered 2017-01-11: 25 ug via INTRAVENOUS
  Administered 2017-01-11: 50 ug via INTRAVENOUS

## 2017-01-11 MED ORDER — MEPERIDINE HCL 25 MG/ML IJ SOLN: 6.2500 mg | INTRAMUSCULAR | Status: DC | PRN

## 2017-01-11 MED ORDER — PROPOFOL 10 MG/ML IV BOLUS
INTRAVENOUS | Status: DC | PRN
  Administered 2017-01-11: 150 mg via INTRAVENOUS

## 2017-01-11 MED ORDER — LACTATED RINGERS IV SOLN
INTRAVENOUS | Status: DC
  Administered 2017-01-11: 08:00:00 via INTRAVENOUS

## 2017-01-11 MED ORDER — CHLORHEXIDINE GLUCONATE 4 % EX LIQD: 60.0000 mL | Freq: Once | CUTANEOUS | Status: DC

## 2017-01-11 MED ORDER — HYDROCODONE-ACETAMINOPHEN 7.5-325 MG PO TABS: 1.0000 | ORAL_TABLET | Freq: Once | ORAL | Status: DC | PRN

## 2017-01-11 MED ORDER — FENTANYL CITRATE (PF) 100 MCG/2ML IJ SOLN: 25.0000 ug | INTRAMUSCULAR | Status: DC | PRN

## 2017-01-11 MED ORDER — PHENYLEPHRINE HCL 10 MG/ML IJ SOLN
INTRAVENOUS | Status: DC | PRN
  Administered 2017-01-11: 50 ug/min via INTRAVENOUS

## 2017-01-11 MED ORDER — LACTATED RINGERS IV SOLN: INTRAVENOUS | Status: DC

## 2017-01-11 MED ORDER — METOCLOPRAMIDE HCL 5 MG/ML IJ SOLN: 10.0000 mg | Freq: Once | INTRAMUSCULAR | Status: DC | PRN

## 2017-01-11 MED ORDER — DEXAMETHASONE SODIUM PHOSPHATE 10 MG/ML IJ SOLN
INTRAMUSCULAR | Status: AC
  Filled 2017-01-11: qty 1

## 2017-01-11 MED ORDER — SUCCINYLCHOLINE CHLORIDE 20 MG/ML IJ SOLN
INTRAMUSCULAR | Status: DC | PRN
  Administered 2017-01-11: 100 mg via INTRAVENOUS

## 2017-01-11 MED ORDER — LIDOCAINE HCL (CARDIAC) 20 MG/ML IV SOLN
INTRAVENOUS | Status: DC | PRN
  Administered 2017-01-11: 50 mg via INTRAVENOUS

## 2017-01-11 MED ORDER — PROPOFOL 500 MG/50ML IV EMUL
INTRAVENOUS | Status: AC
  Filled 2017-01-11: qty 50

## 2017-01-11 MED ORDER — ONDANSETRON HCL 4 MG/2ML IJ SOLN
INTRAMUSCULAR | Status: DC | PRN
  Administered 2017-01-11: 4 mg via INTRAVENOUS

## 2017-01-11 MED ORDER — PHENYLEPHRINE HCL 10 MG/ML IJ SOLN
INTRAMUSCULAR | Status: AC
  Filled 2017-01-11: qty 1

## 2017-01-11 MED ORDER — SCOPOLAMINE 1 MG/3DAYS TD PT72: 1.0000 | MEDICATED_PATCH | Freq: Once | TRANSDERMAL | Status: DC | PRN

## 2017-01-11 MED ORDER — MIDAZOLAM HCL 2 MG/2ML IJ SOLN
1.0000 mg | INTRAMUSCULAR | Status: DC | PRN
  Administered 2017-01-11: 1 mg via INTRAVENOUS

## 2017-01-11 MED ORDER — DEXAMETHASONE SODIUM PHOSPHATE 4 MG/ML IJ SOLN
INTRAMUSCULAR | Status: DC | PRN
  Administered 2017-01-11: 10 mg via INTRAVENOUS

## 2017-01-11 MED ORDER — SUCCINYLCHOLINE CHLORIDE 200 MG/10ML IV SOSY
PREFILLED_SYRINGE | INTRAVENOUS | Status: AC
Start: 2017-01-11 — End: 2017-01-11
  Filled 2017-01-11: qty 10

## 2017-01-11 MED ORDER — ONDANSETRON HCL 4 MG/2ML IJ SOLN
INTRAMUSCULAR | Status: AC
  Filled 2017-01-11: qty 2

## 2017-01-11 MED ORDER — OXYCODONE-ACETAMINOPHEN 5-325 MG PO TABS: 1.0000 | ORAL_TABLET | ORAL | 0 refills | Status: DC | PRN

## 2017-01-11 SURGICAL SUPPLY — 75 items
BENZOIN TINCTURE PRP APPL 2/3 (GAUZE/BANDAGES/DRESSINGS) IMPLANT
BLADE CUTTER GATOR 3.5 (BLADE) ×4 IMPLANT
BLADE CUTTER MENIS 5.5 (BLADE) IMPLANT
BLADE GREAT WHITE 4.2 (BLADE) ×3 IMPLANT
BLADE GREAT WHITE 4.2MM (BLADE) ×1
BLADE SURG 15 STRL LF DISP TIS (BLADE) IMPLANT
BLADE SURG 15 STRL SS (BLADE)
BUR OVAL 6.0 (BURR) ×4 IMPLANT
CANNULA DRY DOC 8X75 (CANNULA) IMPLANT
CANNULA TWIST IN 8.25X7CM (CANNULA) IMPLANT
CLOSURE WOUND 1/2 X4 (GAUZE/BANDAGES/DRESSINGS)
DECANTER SPIKE VIAL GLASS SM (MISCELLANEOUS) IMPLANT
DRAPE OEC MINIVIEW 54X84 (DRAPES) IMPLANT
DRAPE STERI 35X30 U-POUCH (DRAPES) ×4 IMPLANT
DRAPE U-SHAPE 47X51 STRL (DRAPES) ×4 IMPLANT
DRAPE U-SHAPE 76X120 STRL (DRAPES) ×8 IMPLANT
DRSG PAD ABDOMINAL 8X10 ST (GAUZE/BANDAGES/DRESSINGS) ×4 IMPLANT
DURAPREP 26ML APPLICATOR (WOUND CARE) ×4 IMPLANT
ELECT MENISCUS 165MM 90D (ELECTRODE) ×4 IMPLANT
ELECT REM PT RETURN 9FT ADLT (ELECTROSURGICAL) ×4
ELECTRODE REM PT RTRN 9FT ADLT (ELECTROSURGICAL) ×2 IMPLANT
GAUZE SPONGE 4X4 12PLY STRL (GAUZE/BANDAGES/DRESSINGS) ×8 IMPLANT
GAUZE XEROFORM 1X8 LF (GAUZE/BANDAGES/DRESSINGS) ×4 IMPLANT
GLOVE BIOGEL PI IND STRL 7.0 (GLOVE) ×4 IMPLANT
GLOVE BIOGEL PI INDICATOR 7.0 (GLOVE) ×4
GLOVE ECLIPSE 7.0 STRL STRAW (GLOVE) ×4 IMPLANT
GLOVE SURG ORTHO 8.0 STRL STRW (GLOVE) ×4 IMPLANT
GLOVE SURG SS PI 7.0 STRL IVOR (GLOVE) ×8 IMPLANT
GLOVE SURG SS PI 8.0 STRL IVOR (GLOVE) ×4 IMPLANT
GOWN STRL REUS W/ TWL LRG LVL3 (GOWN DISPOSABLE) ×2 IMPLANT
GOWN STRL REUS W/ TWL XL LVL3 (GOWN DISPOSABLE) ×4 IMPLANT
GOWN STRL REUS W/TWL LRG LVL3 (GOWN DISPOSABLE) ×2
GOWN STRL REUS W/TWL XL LVL3 (GOWN DISPOSABLE) ×4
IV NS IRRIG 3000ML ARTHROMATIC (IV SOLUTION) ×12 IMPLANT
MANIFOLD NEPTUNE II (INSTRUMENTS) ×4 IMPLANT
NDL SUT 6 .5 CRC .975X.05 MAYO (NEEDLE) IMPLANT
NEEDLE MAYO TAPER (NEEDLE)
NEEDLE SCORPION MULTI FIRE (NEEDLE) IMPLANT
NS IRRIG 1000ML POUR BTL (IV SOLUTION) IMPLANT
PACK ARTHROSCOPY DSU (CUSTOM PROCEDURE TRAY) ×4 IMPLANT
PACK BASIN DAY SURGERY FS (CUSTOM PROCEDURE TRAY) ×4 IMPLANT
PASSER SUT SWANSON 36MM LOOP (INSTRUMENTS) IMPLANT
PENCIL BUTTON HOLSTER BLD 10FT (ELECTRODE) ×4 IMPLANT
RESTRAINT HEAD UNIVERSAL NS (MISCELLANEOUS) ×4 IMPLANT
SET ARTHROSCOPY TUBING (MISCELLANEOUS) ×2
SET ARTHROSCOPY TUBING LN (MISCELLANEOUS) ×2 IMPLANT
SLEEVE SCD COMPRESS KNEE MED (MISCELLANEOUS) ×4 IMPLANT
SLING ARM FOAM STRAP LRG (SOFTGOODS) ×4 IMPLANT
SLING ARM IMMOBILIZER LRG (SOFTGOODS) IMPLANT
SLING ARM IMMOBILIZER MED (SOFTGOODS) IMPLANT
SLING ARM MED ADULT FOAM STRAP (SOFTGOODS) IMPLANT
SLING ARM XL FOAM STRAP (SOFTGOODS) IMPLANT
SPONGE LAP 4X18 X RAY DECT (DISPOSABLE) IMPLANT
STRIP CLOSURE SKIN 1/2X4 (GAUZE/BANDAGES/DRESSINGS) IMPLANT
SUCTION FRAZIER HANDLE 10FR (MISCELLANEOUS)
SUCTION TUBE FRAZIER 10FR DISP (MISCELLANEOUS) IMPLANT
SUT ETHIBOND 2 OS 4 DA (SUTURE) IMPLANT
SUT ETHILON 2 0 FS 18 (SUTURE) IMPLANT
SUT ETHILON 3 0 PS 1 (SUTURE) ×4 IMPLANT
SUT FIBERWIRE #2 38 T-5 BLUE (SUTURE)
SUT RETRIEVER MED (INSTRUMENTS) IMPLANT
SUT STEEL 4 (SUTURE) IMPLANT
SUT STEEL 5 (SUTURE) IMPLANT
SUT TIGER TAPE 7 IN WHITE (SUTURE) IMPLANT
SUT VIC AB 0 CT1 27 (SUTURE)
SUT VIC AB 0 CT1 27XBRD ANBCTR (SUTURE) IMPLANT
SUT VIC AB 2-0 SH 27 (SUTURE)
SUT VIC AB 2-0 SH 27XBRD (SUTURE) IMPLANT
SUT VIC AB 3-0 FS2 27 (SUTURE) IMPLANT
SUTURE FIBERWR #2 38 T-5 BLUE (SUTURE) IMPLANT
TAPE FIBER 2MM 7IN #2 BLUE (SUTURE) IMPLANT
TOWEL OR 17X24 6PK STRL BLUE (TOWEL DISPOSABLE) ×4 IMPLANT
TOWEL OR NON WOVEN STRL DISP B (DISPOSABLE) ×4 IMPLANT
WATER STERILE IRR 1000ML POUR (IV SOLUTION) ×4 IMPLANT
YANKAUER SUCT BULB TIP NO VENT (SUCTIONS) IMPLANT

## 2017-01-11 NOTE — Anesthesia Procedure Notes (Addendum)
Anesthesia Regional Block: Interscalene brachial plexus block   Pre-Anesthetic Checklist: ,, timeout performed, Correct Patient, Correct Site, Correct Laterality, Correct Procedure, Correct Position, site marked, Risks and benefits discussed,  Surgical consent,  Pre-op evaluation,  At surgeon's request and post-op pain management  Laterality: Left  Prep: chloraprep       Needles:  Injection technique: Single-shot  Needle Type: Echogenic Stimulator Needle     Needle Length: 9cm  Needle Gauge: 21   Needle insertion depth: 4 cm   Additional Needles:   Procedures: ultrasound guided,,,,,,,,  Narrative:  Start time: 01/11/2017 8:01 AM End time: 01/11/2017 8:07 AM Injection made incrementally with aspirations every 5 mL.  Performed by: Personally  Anesthesiologist: Mal AmabileFOSTER, Marquan Vokes  Additional Notes: Timeout performed. Patient sedated. Relevant anatomy ID'd on US. Incremental 2-585ml injection of LA with frequent aspiration. Patient tolerated procedure well.

## 2017-01-11 NOTE — Anesthesia Preprocedure Evaluation (Addendum)
Anesthesia Evaluation  Patient identified by MRN, date of birth, ID band Patient awake    Reviewed: Allergy & Precautions, NPO status , Unable to perform ROS - Chart review only  Airway Mallampati: III  TM Distance: >3 FB Neck ROM: Full    Dental no notable dental hx. (+) Teeth Intact, Caps   Pulmonary former smoker,    Pulmonary exam normal breath sounds clear to auscultation       Cardiovascular hypertension, Pt. on medications + CAD and + Peripheral Vascular Disease  Normal cardiovascular exam Rhythm:Regular Rate:Normal  Acrocyanosis Drug eluding Stents x 2 RCA 2012   Neuro/Psych PSYCHIATRIC DISORDERS Depression Restless legs syndrome Bilateral hearing deficits CVA, No Residual Symptoms    GI/Hepatic negative GI ROS, Neg liver ROS,   Endo/Other  Hypothyroidism Hyperlipidemia  Renal/GU negative Renal ROS Bladder dysfunction  SUI    Musculoskeletal  (+) Arthritis , OA left shoulder Rotator cuff tear left shoulder   Abdominal   Peds  Hematology   Anesthesia Other Findings   Reproductive/Obstetrics                            Anesthesia Physical Anesthesia Plan  ASA: III  Anesthesia Plan: General   Post-op Pain Management:  Regional for Post-op pain   Induction: Intravenous  Airway Management Planned: Oral ETT  Additional Equipment:   Intra-op Plan:   Post-operative Plan: Extubation in OR  Informed Consent: I have reviewed the patients History and Physical, chart, labs and discussed the procedure including the risks, benefits and alternatives for the proposed anesthesia with the patient or authorized representative who has indicated his/her understanding and acceptance.   Dental advisory given  Plan Discussed with: CRNA, Anesthesiologist and Surgeon  Anesthesia Plan Comments:         Anesthesia Quick Evaluation

## 2017-01-11 NOTE — Discharge Instructions (Signed)
Shouder arthroscopy, partial rotator cuff tear debridement subacromial decompression Care After Instructions  Wear sling for the next two days.  Start physical therapy tomorrow!!!  Refer to this sheet in the next few weeks. These discharge instructions provide you with general information on caring for yourself after you leave the hospital. Your caregiver may also give you specific instructions. Your treatment has been planned according to the most current medical practices available, but unavoidable complications sometimes occur. If you have any problems or questions after discharge, please call your caregiver. HOME INSTRUCTIONS You may resume a normal diet and activities as directed. Take showers instead of baths until informed otherwise.  Change bandages (dressings) in 3 days.  Swab wounds daily with betadine.  Wash shoulder with soap and water.  Pat dry.  Cover wounds with bandaids. Only take over-the-counter or prescription medicines for pain, discomfort, or fever as directed by your caregiver.  Wear your sling for the next 2 days unless otherwise instructed. Eat a well-balanced diet.  Avoid lifting or driving until you are instructed otherwise.  Make an appointment to see your caregiver for stitches (suture) or staple removal one week after surgery.  SEEK MEDICAL CARE IF: You have swelling of your calf or leg.  You develop shortness of breath or chest pain.  You have redness, swelling, or increasing pain in the wound.  There is pus or any unusual drainage coming from the surgical site.  You notice a bad smell coming from the surgical site or dressing.  The surgical site breaks open after sutures or staples have been removed.  There is persistent bleeding from the suture or staple line.  You are getting worse or are not improving.  You have any other questions or concerns.  SEEK IMMEDIATE MEDICAL CARE IF:  You have a fever greater than 101 You develop a rash.  You have difficulty  breathing.  You develop any reaction or side effects to medicines given.  Your knee motion is decreasing rather than improving.  MAKE SURE YOU:  Understand these instructions.  Will watch your condition.  Will get help right away if you are not doing well or get worse.    Post Anesthesia Home Care Instructions  Activity: Get plenty of rest for the remainder of the day. A responsible individual must stay with you for 24 hours following the procedure.  For the next 24 hours, DO NOT: -Drive a car -Advertising copywriterperate machinery -Drink alcoholic beverages -Take any medication unless instructed by your physician -Make any legal decisions or sign important papers.  Meals: Start with liquid foods such as gelatin or soup. Progress to regular foods as tolerated. Avoid greasy, spicy, heavy foods. If nausea and/or vomiting occur, drink only clear liquids until the nausea and/or vomiting subsides. Call your physician if vomiting continues.  Special Instructions/Symptoms: Your throat may feel dry or sore from the anesthesia or the breathing tube placed in your throat during surgery. If this causes discomfort, gargle with warm salt water. The discomfort should disappear within 24 hours.  If you had a scopolamine patch placed behind your ear for the management of post- operative nausea and/or vomiting:  1. The medication in the patch is effective for 72 hours, after which it should be removed.  Wrap patch in a tissue and discard in the trash. Wash hands thoroughly with soap and water. 2. You may remove the patch earlier than 72 hours if you experience unpleasant side effects which may include dry mouth, dizziness or visual disturbances. 3.  Avoid touching the patch. Wash your hands with soap and water after contact with the patch.   Regional Anesthesia Blocks  1. Numbness or the inability to move the "blocked" extremity may last from 3-48 hours after placement. The length of time depends on the medication  injected and your individual response to the medication. If the numbness is not going away after 48 hours, call your surgeon.  2. The extremity that is blocked will need to be protected until the numbness is gone and the  Strength has returned. Because you cannot feel it, you will need to take extra care to avoid injury. Because it may be weak, you may have difficulty moving it or using it. You may not know what position it is in without looking at it while the block is in effect.  3. For blocks in the legs and feet, returning to weight bearing and walking needs to be done carefully. You will need to wait until the numbness is entirely gone and the strength has returned. You should be able to move your leg and foot normally before you try and bear weight or walk. You will need someone to be with you when you first try to ensure you do not fall and possibly risk injury.  4. Bruising and tenderness at the needle site are common side effects and will resolve in a few days.  5. Persistent numbness or new problems with movement should be communicated to the surgeon or the [facility] [phone] [facility] [phone]).

## 2017-01-11 NOTE — Anesthesia Procedure Notes (Signed)
Procedure Name: Intubation Performed by: Matheau Orona W Pre-anesthesia Checklist: Patient identified, Emergency Drugs available, Suction available and Patient being monitored Patient Re-evaluated:Patient Re-evaluated prior to inductionOxygen Delivery Method: Circle system utilized Preoxygenation: Pre-oxygenation with 100% oxygen Intubation Type: IV induction Ventilation: Mask ventilation without difficulty Laryngoscope Size: Miller and 2 Grade View: Grade II Tube type: Oral Tube size: 7.0 mm Number of attempts: 1 Airway Equipment and Method: Stylet and Oral airway Placement Confirmation: ETT inserted through vocal cords under direct vision,  positive ETCO2 and breath sounds checked- equal and bilateral Secured at: 23 cm Tube secured with: Tape Dental Injury: Teeth and Oropharynx as per pre-operative assessment        

## 2017-01-11 NOTE — Interval H&P Note (Signed)
History and Physical Interval Note:  01/11/2017 7:31 AM  Beth CorneaLinda S Zandi  has presented today for surgery, with the diagnosis of CARTILAGE DISORDER, OSTEOARTHRITIS LEFT SHOULDER, COMPLETE ROTATOR CUFF TEAR  The various methods of treatment have been discussed with the patient and family. After consideration of risks, benefits and other options for treatment, the patient has consented to  Procedure(s): ARTHROSCOPY SHOULDER WITH DEBRIDEMENT (Left) SHOULDER ARTHROSCOPY WITH POSSIBLE OPEN ROTATOR CUFF REPAIR AND DISTAL CLAVICLE ACROMINECTOMY, MANIPULATION (Left) as a surgical intervention .  The patient's history has been reviewed, patient examined, no change in status, stable for surgery.  I have reviewed the patient's chart and labs.  Questions were answered to the patient's satisfaction.     Loreta Aveaniel F Dakarai Mcglocklin

## 2017-01-11 NOTE — Anesthesia Postprocedure Evaluation (Signed)
Anesthesia Post Note  Patient: Beth Christian  Procedure(s) Performed: Procedure(s) (LRB): ARTHROSCOPY SHOULDER WITH DEBRIDEMENT, MICROFRACTURE OF GLENOID, SUBACROMIAL DECOMPRESSION, DISTAL CLAVICLE RESECTION, MANIPULATION UNDER ANESTHESIA (Left) CLOSED MANIPULATION SHOULDER (Left)  Patient location during evaluation: PACU Anesthesia Type: General Level of consciousness: awake and alert and oriented Pain management: pain level controlled Vital Signs Assessment: post-procedure vital signs reviewed and stable Respiratory status: spontaneous breathing, nonlabored ventilation and respiratory function stable Cardiovascular status: blood pressure returned to baseline and stable Postop Assessment: no signs of nausea or vomiting Anesthetic complications: no       Last Vitals:  Vitals:   01/11/17 1000 01/11/17 1015  BP: (!) 142/80 131/66  Pulse: 78 72  Resp: 15 10  Temp:      Last Pain:  Vitals:   01/11/17 1015  TempSrc:   PainSc: 0-No pain                 [name] A.

## 2017-01-11 NOTE — Transfer of Care (Signed)
Immediate Anesthesia Transfer of Care Note  Patient: Corene CorneaLinda S Pae  Procedure(s) Performed: Procedure(s): ARTHROSCOPY SHOULDER WITH DEBRIDEMENT, MICROFRACTURE OF GLENOID, SUBACROMIAL DECOMPRESSION, DISTAL CLAVICLE RESECTION, MANIPULATION UNDER ANESTHESIA (Left) CLOSED MANIPULATION SHOULDER (Left)  Patient Location: PACU  Anesthesia Type:General  Level of Consciousness: awake and sedated  Airway & Oxygen Therapy: Patient Spontanous Breathing and Patient connected to face mask oxygen  Post-op Assessment: Report given to RN and Post -op Vital signs reviewed and stable  Post vital signs: Reviewed and stable  Last Vitals:  Vitals:   01/11/17 0807 01/11/17 0808  BP: (!) 142/77   Pulse: 65 65  Resp: 12 (!) 9  Temp:      Last Pain:  Vitals:   01/11/17 0736  TempSrc: Oral      Patients Stated Pain Goal: 0 (01/11/17 0736)  Complications: No apparent anesthesia complications

## 2017-01-11 NOTE — Progress Notes (Signed)
Assisted Dr. Foster with left, ultrasound guided, interscalene  block. Side rails up, monitors on throughout procedure. See vital signs in flow sheet. Tolerated Procedure well.  

## 2017-01-12 ENCOUNTER — Encounter (HOSPITAL_BASED_OUTPATIENT_CLINIC_OR_DEPARTMENT_OTHER): Payer: Self-pay | Admitting: Orthopedic Surgery

## 2017-01-12 NOTE — Op Note (Signed)
NAME:  Beth Christian, Beth Christian                    ACCOUNT NO.:  MEDICAL RECORD NO.:  [id number]  LOCATION:                                 FACILITY:  PHYSICIAN:  Loreta Aveaniel F. Melyssa Signor, M.D.      DATE OF BIRTH:  DATE OF PROCEDURE:  01/11/2017 DATE OF DISCHARGE:                              OPERATIVE REPORT   PREOPERATIVE DIAGNOSES:  Left shoulder adhesive capsulitis, impingement, degenerative joint disease, acromioclavicular joint.  POSTOPERATIVE DIAGNOSES:  Left shoulder adhesive capsulitis, impingement, degenerative joint disease, acromioclavicular joint with also a focal 1.5 x 1.5 cm grade 4 lesion, central glenoid with chondral loose bodies.  PROCEDURE:  Left shoulder exam under anesthesia, followed by manipulation.  Arthroscopy with debridement of glenohumeral joint lysing adhesions.  Chondroplasty, removal of loose bodies, microfracture in glenoid.  Bursectomy, acromioplasty, CA ligament release.  Debridement of rotator cuff above and below.  Excision of distal clavicle.  SURGEON:  Loreta Aveaniel F. Jahquan Klugh, M.D.  ASSISTANT:  Mikey KirschnerLindsey Stanberry, PA, was present throughout the entire case and necessary for timely completion of procedure.  ANESTHESIA:  General.  BLOOD LOSS:  Minimal.  SPECIMENS:  None.  CULTURES:  None.  COMPLICATIONS:  None.  DRESSINGS:  Sterile compressive with sling.  DESCRIPTION OF PROCEDURE:  The patient was brought to the operating room and after adequate anesthesia had been obtained, shoulder examined. Lacking a good third of her motion.  Gently manipulated, break up adhesions, achieving full motion and stable shoulder.  Placed in a beach- chair position on the shoulder positioner, prepped and draped in usual sterile fashion.  Three portals anterior, posterior, and lateral. Arthroscope introduced, shoulder distended and inspected.  Residual adhesions.  Synovitis debrided.  Biceps tendon intact.  Undersurface of cuff, some partial tearing, crescent region,  supraspinatus debrided. Nothing full-thickness.  Some grade 2 changes on the humerus, but the central glenoid had a focal grade 4 lesion with unstable chondral flaps and loose bodies.  All of this debrided.  The area of bone was then treated with micro fracturing.  Pressure reduced confirmed bleeding. Instruments and fluid were then removed.  Cannula redirected subacromially.  Adhesions bursa resected.  Partial tearing on the top of the cuff, but again not full-thickness.  Debrided.  CA ligament release. Acromioplasty from type 2 to type 1 acromion.  Also excising a very small os in the front.  Grade 4 changes of clavicle.  Lateral centimeter of clavicle resected.  Adequacy of debridement and decompression confirmed viewing from all portals.  Instruments were fully removed.  Portals were closed with nylon.  Sterile compressive dressing applied.  Sling applied.  Anesthesia reversed.  Brought to the recovery room.  Tolerated the surgery well.  No complications.     Loreta Aveaniel F. Collie Kittel, M.D.     DFM/MEDQ  D:  01/11/2017  T:  01/12/2017  Job:  [id number]

## 2017-04-26 ENCOUNTER — Encounter: Payer: Self-pay | Admitting: Obstetrics and Gynecology

## 2017-04-26 ENCOUNTER — Ambulatory Visit (INDEPENDENT_AMBULATORY_CARE_PROVIDER_SITE_OTHER): Payer: Medicare Other | Admitting: Obstetrics and Gynecology

## 2017-04-26 VITALS — BP 116/62 | HR 80 | Ht 63.0 in | Wt 168.0 lb

## 2017-04-26 DIAGNOSIS — Z01419 Encounter for gynecological examination (general) (routine) without abnormal findings: Secondary | ICD-10-CM | POA: Diagnosis not present

## 2017-04-26 DIAGNOSIS — Z1239 Encounter for other screening for malignant neoplasm of breast: Secondary | ICD-10-CM

## 2017-04-26 DIAGNOSIS — Z124 Encounter for screening for malignant neoplasm of cervix: Secondary | ICD-10-CM

## 2017-04-26 NOTE — Progress Notes (Signed)
PCP: Kirby Funk, MD   Chief Complaint  Patient presents with  . Gynecologic Exam    medicare annual    HPI:      [name] is a 66 y.o. No obstetric history on file. who LMP was No LMP recorded. Patient is postmenopausal., presents today for her annual examination.  Her menses are absent due to menopause.  She does not have intermenstrual bleeding.  She does have vasomotor sx that are tolerable.  Sex activity: not sexually active. She does not have vaginal dryness.  Last Pap: March 07, 2016  Results were: no abnormalities . Neg HPV DNA 1/17. HX of ASCUS/pos HPV DNA 2016. Hx of STDs: HPV There is a FH of vaginal melanoma.   Last mammogram: August 26, 2015  Results were: normal--routine follow-up in 12 months There is a FH of breast cancer in her mom, genetic testing not indicated. There is no FH of ovarian cancer. The patient does not do self-breast exams.  Colonoscopy: current with PCP.  Repeat due after 10 years.   Tobacco use: quit over 30 yrs ago Alcohol use: none Exercise: not active  She does not get adequate calcium and Vitamin D in her diet.   Past Medical History:  Diagnosis Date  . Articular cartilage disorder involving shoulder region, left 12/2016  . Coronary artery disease involving native coronary artery 2012   DES RCA x 2 (most recently for ISR)  . CVA (cerebral infarction) 01/25/2011   during heart cath - no residual deficits  . Depression   . Grinding of teeth   . History of CVA (cerebrovascular accident)   . HTN (hypertension)    states under control with meds., has been on med. x 6 mos.  . Hypothyroidism   . Immature cataract of both eyes   . Osteoarthritis of shoulder    left  . Raynaud's disease   . RLS (restless legs syndrome)   . Rotator cuff tear, left 12/2016  . TMJ dysfunction   . Urinary, incontinence, stress female   . Wears hearing aid in both ears     Past Surgical History:  Procedure Laterality Date  . COLONOSCOPY  WITH PROPOFOL N/A 12/02/2012   Procedure: COLONOSCOPY WITH PROPOFOL;  Surgeon: Charolett Bumpers, MD;  Location: [facility];  Service: Endoscopy;  Laterality: N/A;  . CORONARY ANGIOPLASTY WITH STENT PLACEMENT  01/25/2011   proximal RCA  . LEFT HEART CATHETERIZATION WITH CORONARY ANGIOGRAM N/A 07/07/2011   Procedure: LEFT HEART CATHETERIZATION WITH CORONARY ANGIOGRAM;  Surgeon: [name], MD;  Location: [facility] CATH LAB;  Service: Cardiovascular;  Laterality: N/A;  Femoral  . ORIF DISTAL RADIUS FRACTURE Right 06/11/2012  . PERCUTANEOUS CORONARY STENT INTERVENTION (PCI-S) N/A 07/07/2011   Procedure: PERCUTANEOUS CORONARY STENT INTERVENTION (PCI-S);  Surgeon: [name], MD;  Location: [facility] CATH LAB;  Service: Cardiovascular;  Laterality: N/A;  . SHOULDER CLOSED REDUCTION Left 01/11/2017   Procedure: CLOSED MANIPULATION SHOULDER;  Surgeon: Loreta Ave, MD;  Location: [facility];  Service: Orthopedics;  Laterality: Left;  . TENDON REPAIR Left    thumb  . TONSILLECTOMY     as child  . WRIST SURGERY      Family History  Problem Relation Age of Onset  . Hypertension Mother   . Breast cancer Mother 82  . Lymphoma Mother   . Kidney failure Father   . Coronary artery disease Father   . Throat cancer Sister 72  Social History   Social History  . Marital status: Married    Spouse name: N/A  . Number of children: N/A  . Years of education: N/A   Occupational History  . retired     Development worker, community, school   Social History Main Topics  . Smoking status: Former Smoker    Quit date: 07/07/1979  . Smokeless tobacco: Never Used  . Alcohol use Yes     Comment: occasionally  . Drug use: No  . Sexual activity: Not Currently    Birth control/ protection: Post-menopausal   Other Topics Concern  . Not on file   Social History Narrative  . No narrative on file    Current Meds  Medication Sig  . amLODipine (NORVASC) 5 MG tablet Take 5 mg by mouth daily.  Marland Kitchen  aspirin EC 81 MG tablet Take 81 mg by mouth daily.  Marland Kitchen azelastine (ASTELIN) 0.1 % nasal spray Place 1 spray into both nostrils daily. Use in each nostril as directed  . buPROPion (WELLBUTRIN XL) 150 MG 24 hr tablet Take 150 mg by mouth 3 (three) times daily.   . citalopram (CELEXA) 20 MG tablet Take 20 mg by mouth daily.    . clonazePAM (KLONOPIN) 0.5 MG tablet Take 0.5 mg by mouth at bedtime.  . hydrochlorothiazide (HYDRODIURIL) 25 MG tablet TAKE 1 TABLET (25 MG TOTAL) BY MOUTH DAILY.  Marland Kitchen levothyroxine (SYNTHROID, LEVOTHROID) 75 MCG tablet Take 75 mcg by mouth daily.    . ondansetron (ZOFRAN) 4 MG tablet Take 1 tablet (4 mg total) by mouth every 8 (eight) hours as needed for nausea or vomiting.  . rosuvastatin (CRESTOR) 10 MG tablet TAKE 1 TABLET (10 MG TOTAL) BY MOUTH AT BEDTIME.      ROS:  Review of Systems  Constitutional: Negative for fatigue, fever and unexpected weight change.  Respiratory: Negative for cough, shortness of breath and wheezing.   Cardiovascular: Negative for chest pain, palpitations and leg swelling.  Gastrointestinal: Negative for blood in stool, constipation, diarrhea, nausea and vomiting.  Endocrine: Negative for cold intolerance, heat intolerance and polyuria.  Genitourinary: Negative for dyspareunia, dysuria, flank pain, frequency, genital sores, hematuria, menstrual problem, pelvic pain, urgency, vaginal bleeding, vaginal discharge and vaginal pain.  Musculoskeletal: Negative for back pain, joint swelling and myalgias.  Skin: Negative for rash.  Neurological: Negative for dizziness, syncope, light-headedness, numbness and headaches.  Hematological: Negative for adenopathy.  Psychiatric/Behavioral: Negative for agitation, confusion, sleep disturbance and suicidal ideas. The patient is not nervous/anxious.      Objective: BP 116/62 (BP Location: Left Arm, Patient Position: Sitting, Cuff Size: Normal)   Pulse 80   Ht  (1.6 m)   Wt 168 lb (76.2 kg)   BMI  29.76 kg/m    Physical Exam  Constitutional: She is oriented to person, place, and time. She appears well-developed and well-nourished.  Genitourinary: Vagina normal and uterus normal. There is no rash or tenderness on the right labia. There is no rash or tenderness on the left labia. No erythema or tenderness in the vagina. No vaginal discharge found. Right adnexum does not display mass and does not display tenderness. Left adnexum does not display mass and does not display tenderness. Cervix does not exhibit motion tenderness or polyp. Uterus is not enlarged or tender.  Neck: Normal range of motion. No thyromegaly present.  Cardiovascular: Normal rate, regular rhythm and normal heart sounds.   No murmur heard. Pulmonary/Chest: Effort normal and breath sounds normal. Right breast exhibits no mass,  no nipple discharge, no skin change and no tenderness. Left breast exhibits no mass, no nipple discharge, no skin change and no tenderness.  Abdominal: Soft. There is no tenderness. There is no guarding.  Musculoskeletal: Normal range of motion.  Neurological: She is alert and oriented to person, place, and time. No cranial nerve deficit.  Psychiatric: She has a normal mood and affect. Her behavior is normal.  Vitals reviewed.   Assessment/Plan:  Encounter for annual routine gynecological examination  Cervical cancer screening - Plan: Pap IG (Image Guided)  Screening for breast cancer - Pt to sched mammo. - Plan: MM DIGITAL SCREENING BILATERAL         GYN counsel mammography screening, menopause, osteoporosis, adequate intake of calcium and vitamin D, diet and exercise    F/U  Return in about 2 years (around 04/27/2019).  Alicia B. Copland, PA-C 04/26/2017 8:56 AM

## 2017-05-01 ENCOUNTER — Encounter: Payer: Self-pay | Admitting: Obstetrics and Gynecology

## 2017-05-01 LAB — PAP IG (IMAGE GUIDED): PAP Smear Comment: 0

## 2017-05-16 ENCOUNTER — Ambulatory Visit
Admission: RE | Admit: 2017-05-16 | Discharge: 2017-05-16 | Disposition: A | Discharge status: Home / Self Care | Payer: Medicare Other | Source: Ambulatory Visit | Attending: Obstetrics and Gynecology | Admitting: Obstetrics and Gynecology

## 2017-05-16 DIAGNOSIS — Z1231 Encounter for screening mammogram for malignant neoplasm of breast: Secondary | ICD-10-CM | POA: Insufficient documentation

## 2017-05-16 DIAGNOSIS — Z1239 Encounter for other screening for malignant neoplasm of breast: Secondary | ICD-10-CM

## 2017-07-02 NOTE — Progress Notes (Signed)
Cardiology Office Note    Date:  07/03/2017   ID:  Beth Christian, DOB 1951-03-19, MRN [id number]  PCP:  Kirby Funk, MD  Cardiologist: [name], MD   Chief Complaint  Patient presents with  . Coronary Artery Disease    History of Present Illness:  Beth Christian is a 66 y.o. female CAD with RCA DES 2012, procedure related embolic stroke, hypertension, and hyperlipidemia who returns for clinical follow-up.  Beth Christian is doing well.  She has difficulty with balance and recently fell injuring her left knee.  She denies angina and cardiovascular symptoms.  She specifically denies palpitations.  She did not have syncope with a fall.  She slipped on her deck.  She has not needed nitroglycerin.  No medication side effects are noted.  Past Medical History:  Diagnosis Date  . Articular cartilage disorder involving shoulder region, left 12/2016  . Coronary artery disease involving native coronary artery 2012   DES RCA x 2 (most recently for ISR)  . CVA (cerebral infarction) 01/25/2011   during heart cath - no residual deficits  . Depression   . Grinding of teeth   . History of CVA (cerebrovascular accident)   . HTN (hypertension)    states under control with meds., has been on med. x 6 mos.  . Hypothyroidism   . Immature cataract of both eyes   . Osteoarthritis of shoulder    left  . Raynaud's disease   . RLS (restless legs syndrome)   . Rotator cuff tear, left 12/2016  . TMJ dysfunction   . Urinary, incontinence, stress female   . Wears hearing aid in both ears     Past Surgical History:  Procedure Laterality Date  . ARTHROSCOPY SHOULDER WITH DEBRIDEMENT, MICROFRACTURE OF GLENOID, SUBACROMIAL DECOMPRESSION, DISTAL CLAVICLE RESECTION, MANIPULATION UNDER ANESTHESIA Left 01/11/2017   Performed by Loreta Ave, MD at [facility]  . BREAST BIOPSY Right 09/15/2014   stromal hyalinization and small benign fibroadenoma  . CLOSED MANIPULATION SHOULDER Left  01/11/2017   Performed by Loreta Ave, MD at [facility]  . COLONOSCOPY WITH PROPOFOL N/A 12/02/2012   Performed by Charolett Bumpers, MD at [facility] ENDOSCOPY  . CORONARY ANGIOPLASTY WITH STENT PLACEMENT  01/25/2011   proximal RCA  . LEFT HEART CATHETERIZATION WITH CORONARY ANGIOGRAM N/A 07/07/2011   Performed by [name], MD at [facility] CATH LAB  . OPEN REDUCTION INTERNAL FIXATION (ORIF) DISTAL RADIAL FRACTURE Right 06/11/2012   Performed by Tami Ribas, MD at [facility]  . ORIF DISTAL RADIUS FRACTURE Right 06/11/2012  . PERCUTANEOUS CORONARY STENT INTERVENTION (PCI-S) N/A 07/07/2011   Performed by [name], MD at [facility] CATH LAB  . TENDON REPAIR Left    thumb  . TONSILLECTOMY     as child  . WRIST SURGERY      Current Medications: Outpatient Medications Prior to Visit  Medication Sig Dispense Refill  . amLODipine (NORVASC) 5 MG tablet Take 5 mg by mouth daily.    Marland Kitchen aspirin EC 81 MG tablet Take 81 mg by mouth daily.    Marland Kitchen azelastine (ASTELIN) 0.1 % nasal spray Place 1 spray into both nostrils daily. Use in each nostril as directed    . buPROPion (WELLBUTRIN XL) 150 MG 24 hr tablet Take 150 mg by mouth 3 (three) times daily.     . citalopram (CELEXA) 20 MG tablet Take 20 mg by mouth  daily.      . clonazePAM (KLONOPIN) 0.5 MG tablet Take 0.5 mg by mouth at bedtime.  5  . hydrochlorothiazide (HYDRODIURIL) 25 MG tablet TAKE 1 TABLET (25 MG TOTAL) BY MOUTH DAILY. 90 tablet 2  . levothyroxine (SYNTHROID, LEVOTHROID) 75 MCG tablet Take 75 mcg by mouth daily.      . ondansetron (ZOFRAN) 4 MG tablet Take 1 tablet (4 mg total) by mouth every 8 (eight) hours as needed for nausea or vomiting. 40 tablet 0  . rosuvastatin (CRESTOR) 10 MG tablet TAKE 1 TABLET (10 MG TOTAL) BY MOUTH AT BEDTIME. 30 tablet 11   No facility-administered medications prior to visit.      Allergies:   Latex   Social History   Socioeconomic History  . Marital status: Married     Spouse name: None  . Number of children: None  . Years of education: None  . Highest education level: None  Social Needs  . Financial resource strain: None  . Food insecurity - worry: None  . Food insecurity - inability: None  . Transportation needs - medical: None  . Transportation needs - non-medical: None  Occupational History  . Occupation: retired    CommentNurse, mental health: cafeteria, school  Tobacco Use  . Smoking status: Former Smoker    Last attempt to quit: 07/07/1979    Years since quitting: 38.0  . Smokeless tobacco: Never Used  Substance and Sexual Activity  . Alcohol use: Yes    Comment: occasionally  . Drug use: No  . Sexual activity: Not Currently    Birth control/protection: Post-menopausal  Other Topics Concern  . None  Social History Narrative  . None     Family History:  The patient's family history includes Breast cancer (age of onset: 5662) in her mother; Coronary artery disease in her father; Hypertension in her mother; Kidney failure in her father; Lymphoma in her mother; Throat cancer (age of onset: 2160) in her sister.   ROS:   Please see the history of present illness.    Decreased hearing, easy bruising, difficulty with balance.  Double vision that she had after embolic stroke is completely cleared many years ago. All other systems reviewed and are negative.   PHYSICAL EXAM:   VS:  BP 100/60   Pulse 74   Ht 5\' 3"  (1.6 m)   Wt 168 lb 1.9 oz (76.3 kg)   BMI 29.78 kg/m    GEN: Well nourished, well developed, in no acute distress  HEENT: normal  Neck: no JVD, carotid bruits, or masses Cardiac: RRR; no murmurs, rubs, or gallops,no edema  Respiratory:  clear to auscultation bilaterally, normal work of breathing GI: soft, nontender, nondistended, + BS MS: no deformity or atrophy  Skin: warm and dry, no rash Neuro:  Alert and Oriented x 3, Strength and sensation are intact Psych: euthymic mood, full affect  Wt Readings from Last 3 Encounters:  07/03/17 168  lb 1.9 oz (76.3 kg)  04/26/17 168 lb (76.2 kg)  01/11/17 166 lb 9.6 oz (75.6 kg)      Studies/Labs Reviewed:   EKG:  EKG ECG from 6 months ago is normal.  Recent Labs: 01/11/2017: BUN 19; Creatinine, Ser 1.00; Hemoglobin 13.3; Potassium 3.3; Sodium 142   Lipid Panel    Component Value Date/Time   CHOL 125 10/02/2014 0801   TRIG 55.0 10/02/2014 0801   HDL 59.30 10/02/2014 0801   CHOLHDL 2 10/02/2014 0801   VLDL 11.0 10/02/2014 0801   LDLCALC 55  10/02/2014 0801    Additional studies/ records that were reviewed today include:  No new data    ASSESSMENT:    1. Coronary artery disease involving native coronary artery of native heart without angina pectoris   2. Cerebral infarction due to thrombosis of precerebral artery (HCC)   3. Essential hypertension   4. Mixed hyperlipidemia      PLAN:  In order of problems listed above:  1. Stable coronary disease status post stenting greater than 6 years ago.  She is functionally asymptomatic. 2. No recurrence of diplopia or new neurological complaints.  Perhaps difficulty with balance as a lingering effect of embolic CVA that occurred at the time of coronary angioplasty at 3. Blood pressure rechecked and is 118/60 mmHg.  No changes made. 4. Last LDL cholesterol was 78 in January 2018.  Encouraged physical activity with care.  Clinical follow-up in 1 year.  No change in medical regimen.    Medication Adjustments/Labs and Tests Ordered: Current medicines are reviewed at length with the patient today.  Concerns regarding medicines are outlined above.  Medication changes, Labs and Tests ordered today are listed in the Patient Instructions below. There are no Patient Instructions on file for this visit.   Signed, Lesleigh NoeHenry W Smith III, MD  07/03/2017 8:52 AM    [facility] [address], [place], [place]  [address] Phone: [phone]; Fax: [phone]) [phone]

## 2017-07-03 ENCOUNTER — Encounter: Payer: Self-pay | Admitting: Interventional Cardiology

## 2017-07-03 ENCOUNTER — Encounter (INDEPENDENT_AMBULATORY_CARE_PROVIDER_SITE_OTHER): Payer: Self-pay

## 2017-07-03 ENCOUNTER — Ambulatory Visit: Payer: Medicare Other | Admitting: Interventional Cardiology

## 2017-07-03 VITALS — BP 100/60 | HR 74 | Ht 63.0 in | Wt 168.1 lb

## 2017-07-03 DIAGNOSIS — E782 Mixed hyperlipidemia: Secondary | ICD-10-CM | POA: Diagnosis not present

## 2017-07-03 DIAGNOSIS — I2510 Atherosclerotic heart disease of native coronary artery without angina pectoris: Secondary | ICD-10-CM

## 2017-07-03 DIAGNOSIS — I10 Essential (primary) hypertension: Secondary | ICD-10-CM | POA: Diagnosis not present

## 2017-07-03 DIAGNOSIS — I6300 Cerebral infarction due to thrombosis of unspecified precerebral artery: Secondary | ICD-10-CM

## 2017-07-03 NOTE — Patient Instructions (Signed)

## 2017-07-30 ENCOUNTER — Other Ambulatory Visit: Payer: Self-pay | Admitting: Physician Assistant

## 2017-09-04 ENCOUNTER — Other Ambulatory Visit: Payer: Self-pay | Admitting: Interventional Cardiology

## 2017-10-10 ENCOUNTER — Encounter: Payer: Self-pay | Admitting: Physician Assistant

## 2017-10-10 ENCOUNTER — Telehealth: Payer: Self-pay | Admitting: Interventional Cardiology

## 2017-10-10 ENCOUNTER — Ambulatory Visit: Payer: Medicare Other | Admitting: Physician Assistant

## 2017-10-10 VITALS — BP 140/76 | HR 82 | Ht 63.0 in | Wt 165.1 lb

## 2017-10-10 DIAGNOSIS — E782 Mixed hyperlipidemia: Secondary | ICD-10-CM | POA: Diagnosis not present

## 2017-10-10 DIAGNOSIS — I2510 Atherosclerotic heart disease of native coronary artery without angina pectoris: Secondary | ICD-10-CM | POA: Diagnosis not present

## 2017-10-10 DIAGNOSIS — R2681 Unsteadiness on feet: Secondary | ICD-10-CM

## 2017-10-10 DIAGNOSIS — I10 Essential (primary) hypertension: Secondary | ICD-10-CM | POA: Diagnosis not present

## 2017-10-10 DIAGNOSIS — I351 Nonrheumatic aortic (valve) insufficiency: Secondary | ICD-10-CM | POA: Diagnosis not present

## 2017-10-10 NOTE — Telephone Encounter (Signed)
New message     Patient calling she is having problem with falling a lot and staggering, wants to know if this has anything to do with her heart. Has not checked her vitals to see if they are high or low.  Patient states she is not dizzy or lightheaded.

## 2017-10-10 NOTE — Telephone Encounter (Signed)
Pt states she has fallen 2-3x over the last 6 months.  Last fall was about 4 weeks ago and she did hit her head in the temple area.  Pt has not checked vitals.  Denies lightheadedness, dizziness, syncope or near syncope.  Pt states her legs are "wobbling".  Pt concerned it may be her heart because a friend of hers had similar symptoms and it ended up being a heart valve issue.  Pt very concerned and would like to be seen.  Scheduled pt to see Chelsea AusVin Bhagat, PA-C today at 2pm.  Pt appreciative for call.

## 2017-10-10 NOTE — Progress Notes (Signed)
Cardiology Office Note    Date:  10/10/2017   ID:  LAURINA FISCHL, DOB [birth year], MRN [id number]  PCP:  Kirby Funk, MD  Cardiologist:  Dr. Katrinka Blazing  Chief Complaint: Fall  History of Present Illness:   Beth Christian is a 67 y.o. female CAD with RCA DES 2012, procedure related embolic stroke, hypertension, and hyperlipidemia presents for fall.   Last echo 05/2015 showed LVEF of 60-65%, grade 1 DD, aortic sclerosis with mild to moderate AI, mild MR, trivial TR, normal RVSP.  Last seen by Dr. Katrinka Blazing 07/03/2017. He was doing well on cardiac stand point. He did have mechanical fall prior to visit without LOC. Has issue with balance due to prior stroke.   Added to my schedule for falls.  Patient has history of unsteady gait since her stroke.  She had multiple mechanical falls due to balance issue.  Recently worsened.  Last fall 4 weeks ago.  She was getting out of shower and getting right.  Suddenly lost balance and fall on her left side.  She was seen by orthopedist and planning to start physical therapy.  She denies any loss of consciousness or any prodromal symptoms of chest pain, shortness of breath, dizziness or palpitation.  Patient was seen by Dr. Kirby Funk 2 weeks ago>> she is unsure if she has discussed falling issue however everything looks good.  She denies any dizziness or lightheadedness from sitting/aying to standing position.  Patient has limited ambulation due to unsteady gait.  She denies any exertional symptoms.  Past Medical History:  Diagnosis Date  . Articular cartilage disorder involving shoulder region, left 12/2016  . Coronary artery disease involving native coronary artery 2012   DES RCA x 2 (most recently for ISR)  . CVA (cerebral infarction) 01/25/2011   during heart cath - no residual deficits  . Depression   . Grinding of teeth   . History of CVA (cerebrovascular accident)   . HTN (hypertension)    states under control with meds., has been on med. x 6  mos.  . Hypothyroidism   . Immature cataract of both eyes   . Osteoarthritis of shoulder    left  . Raynaud's disease   . RLS (restless legs syndrome)   . Rotator cuff tear, left 12/2016  . TMJ dysfunction   . Urinary, incontinence, stress female   . Wears hearing aid in both ears     Past Surgical History:  Procedure Laterality Date  . BREAST BIOPSY Right 09/15/2014   stromal hyalinization and small benign fibroadenoma  . COLONOSCOPY WITH PROPOFOL N/A 12/02/2012   Procedure: COLONOSCOPY WITH PROPOFOL;  Surgeon: Charolett Bumpers, MD;  Location: [facility];  Service: Endoscopy;  Laterality: N/A;  . CORONARY ANGIOPLASTY WITH STENT PLACEMENT  01/25/2011   proximal RCA  . LEFT HEART CATHETERIZATION WITH CORONARY ANGIOGRAM N/A 07/07/2011   Procedure: LEFT HEART CATHETERIZATION WITH CORONARY ANGIOGRAM;  Surgeon: [name], MD;  Location: [facility] CATH LAB;  Service: Cardiovascular;  Laterality: N/A;  Femoral  . ORIF DISTAL RADIUS FRACTURE Right 06/11/2012  . PERCUTANEOUS CORONARY STENT INTERVENTION (PCI-S) N/A 07/07/2011   Procedure: PERCUTANEOUS CORONARY STENT INTERVENTION (PCI-S);  Surgeon: [name], MD;  Location: [facility] CATH LAB;  Service: Cardiovascular;  Laterality: N/A;  . SHOULDER CLOSED REDUCTION Left 01/11/2017   Procedure: CLOSED MANIPULATION SHOULDER;  Surgeon: Loreta Ave, MD;  Location: [facility];  Service: Orthopedics;  Laterality: Left;  . TENDON REPAIR Left  thumb  . TONSILLECTOMY     as child  . WRIST SURGERY      Current Medications: Prior to Admission medications   Medication Sig Start Date End Date Taking? Authorizing Provider  amLODipine (NORVASC) 5 MG tablet Take 5 mg by mouth daily.    [provider]  aspirin EC 81 MG tablet Take 81 mg by mouth daily.    [provider]  azelastine (ASTELIN) 0.1 % nasal spray Place 1 spray into both nostrils daily. Use in each nostril as directed    [provider]    buPROPion (WELLBUTRIN XL) 150 MG 24 hr tablet Take 150 mg by mouth 3 (three) times daily.     [provider]  citalopram (CELEXA) 20 MG tablet Take 20 mg by mouth daily.      [provider]  clonazePAM (KLONOPIN) 0.5 MG tablet Take 0.5 mg by mouth at bedtime. 05/20/16   [provider]  hydrochlorothiazide (HYDRODIURIL) 25 MG tablet TAKE 1 TABLET (25 MG TOTAL) BY MOUTH DAILY. 07/31/17   Tereso NewcomerWeaver, Scott T, PA-C  levothyroxine (SYNTHROID, LEVOTHROID) 75 MCG tablet Take 75 mcg by mouth daily.      [provider]  ondansetron (ZOFRAN) 4 MG tablet Take 1 tablet (4 mg total) by mouth every 8 (eight) hours as needed for nausea or vomiting. 01/11/17   Cristie HemStanbery, Mary L, PA-C  rosuvastatin (CRESTOR) 10 MG tablet TAKE 1 TABLET (10 MG TOTAL) BY MOUTH AT BEDTIME. *REFILL 09/11/16* 09/04/17   Lyn RecordsSmith, Henry W, MD    Allergies:   Latex   Social History   Socioeconomic History  . Marital status: Married    Spouse name: None  . Number of children: None  . Years of education: None  . Highest education level: None  Social Needs  . Financial resource strain: None  . Food insecurity - worry: None  . Food insecurity - inability: None  . Transportation needs - medical: None  . Transportation needs - non-medical: None  Occupational History  . Occupation: retired    CommentNurse, mental health: cafeteria, school  Tobacco Use  . Smoking status: Former Smoker    Last attempt to quit: 07/07/1979    Years since quitting: 38.2  . Smokeless tobacco: Never Used  Substance and Sexual Activity  . Alcohol use: Yes    Comment: occasionally  . Drug use: No  . Sexual activity: Not Currently    Birth control/protection: Post-menopausal  Other Topics Concern  . None  Social History Narrative  . None     Family History:  The patient's family history includes Breast cancer (age of onset: 10462) in her mother; Coronary artery disease in her father; Hypertension in her mother; Kidney failure in her  father; Lymphoma in her mother; Throat cancer (age of onset: 1760) in her sister.   ROS:   Please see the history of present illness.    ROS All other systems reviewed and are negative.   PHYSICAL EXAM:   VS:  BP 140/76   Pulse 82   Ht 5\' 3"  (1.6 m)   Wt 165 lb 1.9 oz (74.9 kg)   SpO2 96%   BMI 29.25 kg/m    GEN: Well nourished, well developed, in no acute distress  HEENT: normal  Neck: no JVD, carotid bruits, or masses Cardiac: RRR; + murmurs, rubs, or gallops,no edema  Respiratory:  clear to auscultation bilaterally, normal work of breathing GI: soft, nontender, nondistended, + BS MS: no deformity or atrophy  Skin:  warm and dry, no rash Neuro:  Alert and Oriented x 3, Strength and sensation are intact Psych: euthymic mood, full affect  Wt Readings from Last 3 Encounters:  10/10/17 165 lb 1.9 oz (74.9 kg)  07/03/17 168 lb 1.9 oz (76.3 kg)  04/26/17 168 lb (76.2 kg)      Studies/Labs Reviewed:   EKG:  EKG is ordered today.  The ekg ordered today demonstrates sinus rhythm at rate of 82 bpm.  Nonspecific ST/T wave abnormality which is similar to prior EKG.  Recent Labs: 01/11/2017: BUN 19; Creatinine, Ser 1.00; Hemoglobin 13.3; Potassium 3.3; Sodium 142   Lipid Panel    Component Value Date/Time   CHOL 125 10/02/2014 0801   TRIG 55.0 10/02/2014 0801   HDL 59.30 10/02/2014 0801   CHOLHDL 2 10/02/2014 0801   VLDL 11.0 10/02/2014 0801   LDLCALC 55 10/02/2014 0801    Additional studies/ records that were reviewed today include:   ECcho as above  Cardiac Catheterization:  01/26/11 PROCEDURE PERFORMED: 1. Left heart catheterization via the right radial approach. 2. Coronary angiography. 3. Left ventriculography. 4. Drug-eluting stent proximal RCA.  DESCRIPTION:  A 5-French sheath was inserted into the right radial artery using micropuncture technique.  We then gave 50 units per kg of heparin.  Verapamil was given intra-arterially to prevent spasm.  We then  advanced into the ascending aorta over a tight J 0.035 exchange length guidewire.  Using an A2 5-French multipurpose catheter, we performed left ventriculography, right coronary angiography, and flush injections of the left coronary.  Of note is heavy focal calcification in the mid RCA, the distal left main, and the mid left anterior descending.  We used a 3.5 x 5-French left Judkins catheter for left coronary angiography.  We could not get coaxial images and changed to a 3.0 cm left coronary 5-French guide catheter for left coronary diagnostic images.  There was mild catheter damping with engagement in the left main.  Flow was brisk.  After studying the patient's images and correlating this with the patient's presenting clinical data, we decided to perform PCI on the right coronary.  We used a 5-French Judkins right guide catheter and a BMW guidewire. Angiomax bolus followed by an infusion was given.  A 600 mg of Plavix was administered.  We used a 2.5-mm x 12-mm long balloon.  Two balloon inflations were performed at 10 atmospheres.  We then positioned and deployed a 3.0 x 16-mm long Promus element stent to 12 atmospheres.  Two balloon inflations were performed.  We then postdilated with a 325 x 12- mm [place] Quantum.  Two balloon inflations to 14 atmospheres were performed. The patient had ST-segment changes with each balloon inflation but no chest pain.  The final angiographic images revealed a nice result with brisk flow.  A 400 mcg of intracoronary nitroglycerin was administered during the case. Angiomax was discontinued after the last post still balloon inflation.  A wristband was applied with good hemostasis.  No significant complications occurred during this procedure.  RESULTS:  I  Hemodynamic data: A.  Aortic pressure 143/77. B.  Left ventricular pressure 143/27 mmHg. 1. Left ventriculography:  Left ventricular cavity size and function     are normal, EF is 65%. 2.  Coronary angiography.     a.     Left main coronary:  There is 25-30% narrowing in the      proximal left main.  The distal left main is heavily calcified.  Catheter damping is noted with engagement with Judkins catheter      due to wedging.  Left catheter damping is noted with a 5-French      EBU guide catheter.  Final grade on the left main is 20-40%      stenosis.     b.     Left anterior descending coronary:  Left anterior descending      is widely patent but with irregularities in the midvessel and      heavy calcification.  Three diagonal branches arise from the left      anterior descending.  They are all widely patent.  The LAD is      transapical.  There is up to 30-40% narrowing in the mid LAD.     c.     Circumflex artery:  Two obtuse marginal branches arise from      the circumflex.  The territory supplied is relatively small.  No      high-grade obstruction is seen.     d.     Right coronary:  The right coronary is dominant and heavily      calcified.  Gives origin to a large PDA that contains proximal 50%      narrowing.  Three left ventricular branches arise from it.  The      proximal right coronary contains somewhat concentric 95% stenosis.      There is mid vessel calcification with 50% narrowing.  TIMI flow      was grade 3. 3. PCI of the right coronary:  RCA 95%, proximal stenosis reduced to     0% with TIMI grade 3 flow using a Promus element drug-eluting     stent.  TIMI grade 3 flow was noted.  CONCLUSIONS: 1. Early positive exercise treadmill test with inferior ST elevation     secondary to high-grade proximal RCA stenosis. 2. Successful drug-eluting stent implantation in the proximal RCA     resulting in 0% stenosis and TIMI grade 3 flow. 3. Heavy calcification of both the left and right coronaries as     outlined above and 25-30% proximal left main.  PLAN:  Aggressive risk factor modification, Effient therapy for at least 12 months.  Close clinical  followup.    ASSESSMENT & PLAN:    1. Falls/unsteady gait Her mechanical fall is due to unsteady gait and balance issue.  She denies any prodromal symptoms.  No loss of consciousness. She is going to start physical therapy.  Agree with this plan.  She needs gait training.  She has a cane but never uses.  Encouraged compliance.  2. CAD s/p DES to RCA -No anginal symptoms.  Continue aspirin and statin.   3. HTN -Blood pressure relatively stable.  No change in therapy today.  Keep log.  4. HLD -Continue statin.  5. Mild to moderate AI - faint murmur. No CHF syncope or syncope. Given lack of cardiac symptoms will defer further evaluation with echo until next office visit.   I have encouraged her to follow up with PCP for evaluation of worsening unsteady gait. She does not have any cardiac symptoms.   Medication Adjustments/Labs and Tests Ordered: Current medicines are reviewed at length with the patient today.  Concerns regarding medicines are outlined above.  Medication changes, Labs and Tests ordered today are listed in the Patient Instructions below. Patient Instructions  Medication Instructions:   Your physician recommends that you continue on your current medications as directed. Please refer to  the Current Medication list given to you today.   If you need a refill on your cardiac medications before your next appointment, please call your pharmacy.  Labwork: NONE ORDERED  TODAY    Testing/Procedures: NONE ORDERED  TODAY   Follow-Up:  WITH DR SMITH  IN NOVEMEBER     CALL BACK IN  SEPTEMBER TO MAKE AN APPOINTMENT   Any Other Special Instructions Will Be Listed Below (If Applicable).  MAKE SURE YOU A KEEP A LOG OF BLOOD PRESSURE IF PRESSURE RUNS CONSISTENTLY OVER 140/90 [facility] TRIAGE [phone]                                                                                                                                                     Vonzella Nipple [place], [place]  10/10/2017 2:41 PM    [facility] [address] [place], [place], [place]  [address] Phone: [phone]; Fax: [phone]

## 2017-10-10 NOTE — Patient Instructions (Addendum)
Medication Instructions:   Your physician recommends that you continue on your current medications as directed. Please refer to the Current Medication list given to you today.   If you need a refill on your cardiac medications before your next appointment, please call your pharmacy.  Labwork: NONE ORDERED  TODAY    Testing/Procedures: NONE ORDERED  TODAY   Follow-Up:  WITH DR SMITH  IN NOVEMEBER     CALL BACK IN  SEPTEMBER TO MAKE AN APPOINTMENT   Any Other Special Instructions Will Be Listed Below (If Applicable).  MAKE SURE YOU A KEEP A LOG OF BLOOD PRESSURE IF PRESSURE RUNS CONSISTENTLY OVER 140/90 CALL CLINIC NURSE TRIAGE [phone]

## 2017-11-21 NOTE — Progress Notes (Signed)
Beth Christian was seen today in neurologic consultation at the request of Kirby Funk, MD.  The consultation is for the evaluation of gait change.   This patient is accompanied in the office by her husband who supplements the history.  Pt states that he has noted balance issues for about a year; however, he states that the first time she fell was 3 years ago she "stumbled" and broke a bone in her wrist.  Then just recently she was moving a box in the living room and fell and broke a bone in her other wrist.  No LE paresthesias.  No DM.  Had a stroke during a cardiac stent in 2012 and ended up with diplopia for 6 months.  None since that time.  Good control over bladder and bowel.    Neuroimaging has previously been performed.  It is available for my review today.  I reviewed this with the patient and her husband.  I also compared it to her 2012 examination.  There is evidence of cerebral white matter disease.  A few do appear to line up perpendicular to the ventricle, but most are scattered throughout.  11/22/17 update: Patient is seen today in follow-up.  This patient is accompanied in the office by her spouse who supplements the history.I have not seen her since 2017.  Dr. Farris Has, her orthopedic physician, referred her for intermittent falls and balance change.  This is the same thing that she was seen for a few years ago.  MRI cervical spine was done in 2017 demonstrating degenerative changes.  MRA of the brain demonstrated widely patent carotids with moderate stenosis at the origin of the left vertebral artery.  She was already on aspirin.  EMG was completed in 2017 as well.  This was normal.  Husband reports that she really has not changed/progressed significantly but had a few falls this past year.  On one occasion fell on ice, despite fact that family told her not to go out on the ice.  Fell on knee.  No fx.  With the other, she got out of the shower she slipped and fell and hit the left shoulder  and fx it.  She states that she thinks that these events are unusual because if she bends over she cannot regain her balance.  No dizziness.  No vertigo.  No hx of balance therapy.  States that she is to start vestibular rehab tomorrow.  Their son in law is a PT there at [employer].  No dizziness.  Some trouble climbing stairs.  Pts husband also c/o pts memory loss.  Able to prepare pill box.  Husband does finances.  Pt drives.  She recently went home from PT appt and thought that she took a short cut but ended up not where she wanted, but was able to find way home.    ALLERGIES:   Allergies  Allergen Reactions  . Latex Itching and Rash    CURRENT MEDICATIONS:  Outpatient Encounter Medications as of 11/22/2017  Medication Sig  . amLODipine (NORVASC) 5 MG tablet Take 5 mg by mouth daily.  Marland Kitchen aspirin EC 81 MG tablet Take 81 mg by mouth daily.  Marland Kitchen buPROPion (WELLBUTRIN XL) 150 MG 24 hr tablet Take 150 mg by mouth 3 (three) times daily.   . citalopram (CELEXA) 20 MG tablet Take 20 mg by mouth daily.    . clonazePAM (KLONOPIN) 0.5 MG tablet Take 0.5 mg by mouth at bedtime.  . fluticasone (FLONASE) 50  MCG/ACT nasal spray Place into both nostrils daily.  . hydrochlorothiazide (HYDRODIURIL) 25 MG tablet TAKE 1 TABLET (25 MG TOTAL) BY MOUTH DAILY.  Marland Kitchen levothyroxine (SYNTHROID, LEVOTHROID) 75 MCG tablet Take 75 mcg by mouth daily.    . rosuvastatin (CRESTOR) 10 MG tablet TAKE 1 TABLET (10 MG TOTAL) BY MOUTH AT BEDTIME. *REFILL 09/11/16*  . [DISCONTINUED] azelastine (ASTELIN) 0.1 % nasal spray Place 1 spray into both nostrils daily. Use in each nostril as directed  . [DISCONTINUED] ondansetron (ZOFRAN) 4 MG tablet Take 1 tablet (4 mg total) by mouth every 8 (eight) hours as needed for nausea or vomiting.   No facility-administered encounter medications on file as of 11/22/2017.     PAST MEDICAL HISTORY:   Past Medical History:  Diagnosis Date  . Articular cartilage disorder involving shoulder region,  left 12/2016  . Coronary artery disease involving native coronary artery 2012   DES RCA x 2 (most recently for ISR)  . CVA (cerebral infarction) 01/25/2011   during heart cath - no residual deficits  . Depression   . Grinding of teeth   . History of CVA (cerebrovascular accident)   . HTN (hypertension)    states under control with meds., has been on med. x 6 mos.  . Hypothyroidism   . Immature cataract of both eyes   . Osteoarthritis of shoulder    left  . Raynaud's disease   . RLS (restless legs syndrome)   . Rotator cuff tear, left 12/2016  . TMJ dysfunction   . Urinary, incontinence, stress female   . Wears hearing aid in both ears     PAST SURGICAL HISTORY:   Past Surgical History:  Procedure Laterality Date  . BREAST BIOPSY Right 09/15/2014   stromal hyalinization and small benign fibroadenoma  . COLONOSCOPY WITH PROPOFOL N/A 12/02/2012   Procedure: COLONOSCOPY WITH PROPOFOL;  Surgeon: Charolett Bumpers, MD;  Location: [facility];  Service: Endoscopy;  Laterality: N/A;  . CORONARY ANGIOPLASTY WITH STENT PLACEMENT  01/25/2011   proximal RCA  . LEFT HEART CATHETERIZATION WITH CORONARY ANGIOGRAM N/A 07/07/2011   Procedure: LEFT HEART CATHETERIZATION WITH CORONARY ANGIOGRAM;  Surgeon: [name], MD;  Location: [facility] CATH LAB;  Service: Cardiovascular;  Laterality: N/A;  Femoral  . ORIF DISTAL RADIUS FRACTURE Right 06/11/2012  . PERCUTANEOUS CORONARY STENT INTERVENTION (PCI-S) N/A 07/07/2011   Procedure: PERCUTANEOUS CORONARY STENT INTERVENTION (PCI-S);  Surgeon: [name], MD;  Location: [facility] CATH LAB;  Service: Cardiovascular;  Laterality: N/A;  . SHOULDER CLOSED REDUCTION Left 01/11/2017   Procedure: CLOSED MANIPULATION SHOULDER;  Surgeon: Loreta Ave, MD;  Location: [facility];  Service: Orthopedics;  Laterality: Left;  . TENDON REPAIR Left    thumb  . TONSILLECTOMY     as child  . WRIST SURGERY      SOCIAL HISTORY:   Social History    Socioeconomic History  . Marital status: Married    Spouse name: Not on file  . Number of children: Not on file  . Years of education: Not on file  . Highest education level: Not on file  Occupational History  . Occupation: retired    CommentNurse, mental health, school  Social Needs  . Financial resource strain: Not on file  . Food insecurity:    Worry: Not on file    Inability: Not on file  . Transportation needs:    Medical: Not on file    Non-medical: Not on file  Tobacco Use  .  Smoking status: Former Smoker    Last attempt to quit: 07/07/1979    Years since quitting: 38.4  . Smokeless tobacco: Never Used  Substance and Sexual Activity  . Alcohol use: Yes    Comment: occasionally  . Drug use: No  . Sexual activity: Not Currently    Birth control/protection: Post-menopausal  Lifestyle  . Physical activity:    Days per week: Not on file    Minutes per session: Not on file  . Stress: Not on file  Relationships  . Social connections:    Talks on phone: Not on file    Gets together: Not on file    Attends religious service: Not on file    Active member of club or organization: Not on file    Attends meetings of clubs or organizations: Not on file    Relationship status: Not on file  . Intimate partner violence:    Fear of current or ex partner: Not on file    Emotionally abused: Not on file    Physically abused: Not on file    Forced sexual activity: Not on file  Other Topics Concern  . Not on file  Social History Narrative  . Not on file    FAMILY HISTORY:   Family Status  Relation Name Status  . Mother  Alive  . Father  Deceased  . Sister  Alive  . Daughter 2 Alive       one with kidney transplant    ROS:  Review of Systems  Constitutional: Negative.   HENT: Negative.   Eyes: Negative.   Respiratory: Negative.   Cardiovascular: Negative.   Gastrointestinal: Negative.   Genitourinary: Negative.   Musculoskeletal: Positive for falls.  Skin: Negative.    Neurological: Negative.   Endo/Heme/Allergies: Negative.   Psychiatric/Behavioral: Positive for memory loss.    PHYSICAL EXAMINATION:    VITALS:   Vitals:   11/22/17 0835  BP: (!) 148/62  Pulse: 70  SpO2: 98%  Weight: 167 lb (75.8 kg)  Height: 5\' 3"  (1.6 m)    GEN:  Normal appears female in no acute distress.  Appears stated age. HEENT:  Normocephalic, atraumatic. The mucous membranes are moist. The superficial temporal arteries are without ropiness or tenderness. Cardiovascular: Regular rate and rhythm. Lungs: Clear to auscultation bilaterally. Neck/Heme: There are no carotid bruits noted bilaterally.  NEUROLOGICAL: Orientation:  The patient is alert and oriented x 3.  Fund of knowledge is appropriate.  Recent and remote memory intact.  Attention span and concentration normal.  Repeats and names without difficulty. Cranial nerves: There is good facial symmetry. The pupils are equal round and reactive to light bilaterally. Fundoscopic exam reveals clear disc margins bilaterally. Extraocular muscles are intact and visual fields are full to confrontational testing. Speech is fluent and clear. Soft palate rises symmetrically and there is no tongue deviation. Hearing is intact to conversational tone. Tone: Tone is good throughout. Sensation: Sensation is intact to light touch and pinprick throughout (facial, trunk, extremities). Vibration is intact at the bilateral big toe. There is no extinction with double simultaneous stimulation. There is no sensory dermatomal level identified. Coordination:  The patient has no difficulty with RAM's or FNF bilaterally. Motor: Strength is 5/5 in the bilateral upper and lower extremities.  Shoulder shrug is equal and symmetric. There is no pronator drift.  There are no fasciculations noted. DTR's: Deep tendon reflexes are 3+/4 at the bilateral biceps, triceps, brachioradialis, patella and achilles.  Plantar responses are downgoing bilaterally.  Gait and  Station: The patient is able to ambulate without difficulty.  She is unable to ambulate in a tandem fashion (same as 2 years ago).  Able to stand in romberg position   IMPRESSION/PLAN  1. Gait instability  -neuro exam is stable from few years ago.  Neuro exam nonfocal and non lateralizing.  Don't see neuro etiology for falls but expressed to pt/husband that some of these falls really seem reasonable rather than a pathologic loss of balance (slipped getting out of shower, slipped on ice).  -scheduled tomorrow for vestibular rehab.  I don't see evidence of vestibular dysfunction.  Would like to change to balance therapy and she agrees.  Will have done at [facility]  -we will do some labs today.  B12, folate, RPR, SPEP/UPEP.  Will add AchR Ab's given difficulty with climbing stairs, although that appears more balance and deconditioning.  No sob or ptosis 2.  Memory loss  -discussed neurocognitive testing.  They would like to proceed  -do TSH 3.  F/u will depend on results of above.  Much greater than 50% of this visit was spent in counseling and coordinating care.  Total face to face time:  30 min    Cc:  Kirby Funk, MD

## 2017-11-22 ENCOUNTER — Encounter: Payer: Self-pay | Admitting: Neurology

## 2017-11-22 ENCOUNTER — Telehealth: Payer: Self-pay | Admitting: Neurology

## 2017-11-22 ENCOUNTER — Ambulatory Visit: Payer: Medicare Other | Admitting: Neurology

## 2017-11-22 ENCOUNTER — Other Ambulatory Visit: Payer: Medicare Other

## 2017-11-22 VITALS — BP 148/62 | HR 70 | Ht 63.0 in | Wt 167.0 lb

## 2017-11-22 DIAGNOSIS — G609 Hereditary and idiopathic neuropathy, unspecified: Secondary | ICD-10-CM | POA: Diagnosis not present

## 2017-11-22 DIAGNOSIS — R531 Weakness: Secondary | ICD-10-CM

## 2017-11-22 DIAGNOSIS — R413 Other amnesia: Secondary | ICD-10-CM | POA: Diagnosis not present

## 2017-11-22 DIAGNOSIS — R296 Repeated falls: Secondary | ICD-10-CM

## 2017-11-22 NOTE — Telephone Encounter (Signed)
Referral faxed to [facility] to change therapy from vestibular rehab to balance therapy. Faxed to [phone] with confirmation received.

## 2017-11-22 NOTE — Patient Instructions (Signed)
1. Your provider has requested that you have labwork completed today. Please go to [facility] ([id number]) on the second floor of this building before leaving the office today. You do not need to check in. If you are not called within 15 minutes please check with the front desk.   2. You have been referred for a neurocognitive evaluation in our office.   The evaluation takes approximately two hours. The first part of the appointment is a clinical interview with the neuropsychologist (Dr. Elvis CoilMaryBeth Bailar). Please bring someone with you to this appointment if possible, as it is helpful for Dr. Alinda DoomsBailar to hear from both you and another adult who knows you well. After speaking with Dr. Alinda DoomsBailar, you will complete testing with her technician. The testing includes a variety of tasks- mostly question-and-answer, some paper-and-pencil. There is nothing you need to do to prepare for this appointment, but having a good night's sleep prior to the testing, and bringing eyeglasses and hearing aids (if you wear them), is advised.   About a week after the evaluation, you will return to follow up with Dr. Alinda DoomsBailar to review the test results. This appointment is about 30 minutes. If you would like a family member to receive this information as well, please bring them to the appointment.   We have to reserve several hours of the neuropsychologist's time and the psychometrician's time for your evaluation appointment. As such, please note that there is a No-Show fee of $100. If you are unable to attend any of your appointments, please contact our office as soon as possible to reschedule.  3. We will change therapy at [facility] to balance therapy

## 2017-11-23 ENCOUNTER — Telehealth: Payer: Self-pay | Admitting: Neurology

## 2017-11-23 NOTE — Telephone Encounter (Signed)
-----   Message from Octaviano Battyebecca S Tat, DO sent at 11/23/2017  7:23 AM EDT ----- Tell pt b12 just a tad low.  Would like to see over 400.  Have her take OTC supplement

## 2017-11-23 NOTE — Telephone Encounter (Signed)
Not all labs are back. I will wait until all results are back to call patient.

## 2017-11-26 NOTE — Telephone Encounter (Signed)
Left message on machine for patient to call back.

## 2017-11-26 NOTE — Telephone Encounter (Signed)
Patient made aware.

## 2017-12-01 LAB — PROTEIN ELECTROPHORESIS,RANDOM URN: CREATININE, URINE: 30 mg/dL (ref 20–275)

## 2017-12-01 LAB — TSH: TSH: 1.33 m[IU]/L (ref 0.40–4.50)

## 2017-12-01 LAB — PROTEIN ELECTROPHORESIS, SERUM
ALPHA 1: 0.3 g/dL (ref 0.2–0.3)
ALPHA 2: 0.8 g/dL (ref 0.5–0.9)
Albumin ELP: 4.4 g/dL (ref 3.8–4.8)
BETA GLOBULIN: 0.4 g/dL (ref 0.4–0.6)
Beta 2: 0.3 g/dL (ref 0.2–0.5)
Gamma Globulin: 1 g/dL (ref 0.8–1.7)
TOTAL PROTEIN: 7.2 g/dL (ref 6.1–8.1)

## 2017-12-01 LAB — RPR: RPR Ser Ql: NONREACTIVE

## 2017-12-01 LAB — IMMUNOFIXATION INTE

## 2017-12-01 LAB — VITAMIN B12: VITAMIN B 12: 380 pg/mL (ref 200–1100)

## 2017-12-01 LAB — MYASTHENIA GRAVIS PANEL 2: ACETYLCHOL MODUL AB: 20 %{inhibition}

## 2017-12-01 LAB — IMMUNOFIXATION ELECTROPHORESIS
IgG (Immunoglobin G), Serum: 1021 mg/dL (ref 694–1618)
IgM, Serum: 179 mg/dL (ref 48–271)
Immunofix Electr Int: NOT DETECTED
Immunoglobulin A: 157 mg/dL (ref 81–463)

## 2017-12-01 LAB — FOLATE: Folate: 12.4 ng/mL

## 2018-04-08 ENCOUNTER — Other Ambulatory Visit: Payer: Self-pay | Admitting: Obstetrics and Gynecology

## 2018-04-08 DIAGNOSIS — Z1231 Encounter for screening mammogram for malignant neoplasm of breast: Secondary | ICD-10-CM

## 2018-04-28 NOTE — Progress Notes (Signed)
NEUROBEHAVIORAL STATUS EXAM   Name: Beth Christian Date of Birth: 12-Dec-1950 Date of Interview: 04/28/2018  Reason for Referral:  Beth Christian is a 66 y.o. female who is referred for neuropsychological evaluation by Dr. Lurena Joiner Tat of [facility] due to concerns about memory loss. This patient is accompanied in the office by her husband who supplements the history.  History of Presenting Problem:  Beth Christian was seen by Dr. Arbutus Leas in 2017 for initial consult for falls and again in April 2019 for the same problem. Her neuro exam was stable, non focal and non lateralizing. Dr. Arbutus Leas did not see any neurologic etiology for falls. Many of the falls appeared to represent reasonable loss of balance (slipping on ice, slipping getting out of the shower). Her husband also noted gradual onset of memory loss so she was referred for neurocognitive evaluation. Previous brain MRI report from 05/09/2016 noted chronic small vessel ischemia (mild progression since 2012), no acute intracranial abnormality. She does have a history of focal stroke during cardiac stent in 2012 which caused diplopia for about 6 months (resolved spontaneously).   At today's visit (04/29/2018), the patient endorses memory concerns. She and her husband report gradual onset approx 1 to 1 1/2 years ago. They report mostly short term memory difficulty. She forgets things she has been told recently and will repeat questions. She knows her husband told her something but she can't recall the answer. She also has more difficulty recalling recent events. She is a bit more distractible. She endorses word finding difficulty which is frustrating to her. She is not misplacing or losing things, not getting lost, not forgetting to take medication. She drives locally and only on one occasion did she have uncertainty about directions (getting home from a PT appointment). She has never gotten lost. She manages her medications and does all the cooking  with no difficulty. Her husband has always managed the finances. Her husband keeps her appointments in his phone and reminds her of them.  She has a family history of dementia in her mother who is alive, age 58, living at home.  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 She reports less motivation and initiation, her husband agrees. He also notes she is a bit more insecure or attached when they are out of the house together. She stays close to him or their daughter when they are shopping, for example. She seems more fearful of being left alone. She has a history of depression and anxiety, which she feels is well managed with Wellbutrin and Celexa. She has never really had counseling/therapy for these issues. She denies suicidal ideation or intention. She denies sleep difficulty. Appetite is good.   She continues to be concerned about balance but upon direct questioning neither she nor her husband note balance difficulty on a regular basis. She has had one fall since seeing Dr. Arbutus Leas in April, this was when she was bending down to pick tomatoes in their yard. She did not injure herself. She has never hit her head or lost consciousness in the context of any fall. Upon direct questioning she denies history of hallucinations or illusions, but does occasionally think she sees a bug out of the corner of her eye and when she turns to look it is not there.   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 Alcohol: glass of wine 2-3 days a week Tobacco: Former smoker, quit around age 67   Medical History: Past Medical History:  Diagnosis Date  . Articular cartilage  disorder involving shoulder region, left 12/2016  . Coronary artery disease involving native coronary artery 2012   DES RCA x 2 (most recently for ISR)  . CVA (cerebral infarction) 01/25/2011   during heart cath - no residual deficits  . Depression   . Grinding of teeth   . History of CVA (cerebrovascular accident)   . HTN (hypertension)    states under control with meds., has been on med. x 6 mos.  . Hypothyroidism   . Immature cataract of both eyes   . Osteoarthritis of shoulder    left  . Raynaud's disease   . RLS (restless legs syndrome)   . Rotator cuff tear, left 12/2016  . TMJ dysfunction   . Urinary, incontinence, stress female   . Wears hearing aid in both ears       Current Medications:  Outpatient Encounter Medications as of 04/29/2018  Medication Sig  . amLODipine (NORVASC) 5 MG tablet Take 5 mg by mouth daily.  Marland Kitchen. aspirin EC 81 MG tablet Take 81 mg by mouth daily.  Marland Kitchen. buPROPion (WELLBUTRIN XL) 150 MG 24 hr tablet Take 150 mg by mouth 3 (three) times daily.   . citalopram (CELEXA) 20 MG tablet Take 20 mg by mouth daily.    . clonazePAM (KLONOPIN) 0.5 MG tablet Take 0.5 mg by mouth at bedtime.  . fluticasone (FLONASE) 50 MCG/ACT nasal spray Place into both nostrils daily.  . hydrochlorothiazide (HYDRODIURIL) 25 MG tablet TAKE 1 TABLET (25 MG TOTAL) BY MOUTH DAILY.  Marland Kitchen. levothyroxine (SYNTHROID, LEVOTHROID) 75 MCG tablet Take 75 mcg by mouth daily.    . rosuvastatin (CRESTOR) 10 MG tablet TAKE 1 TABLET (10 MG TOTAL) BY MOUTH AT BEDTIME. *REFILL 09/11/16*   No facility-administered encounter medications on file as of 04/29/2018.      Behavioral Observations:   Appearance: Neatly and appropriately dressed and groomed Gait: Ambulated independently, no gross abnormalities observed Speech: Fluent; normal rate, rhythm and volume. Minimal word finding difficulty. Thought process: Linear, goal directed Affect: Full, pleasant, mildly anxious Interpersonal: Pleasant,  appropriate   40 minutes spent face-to-face with patient completing neurobehavioral status exam. 30 minutes spent integrating medical records/clinical data and completing this report. CPT code [id number] unit.   TESTING: There is medical necessity to proceed with neuropsychological assessment as the results will be used to aid in differential diagnosis and clinical decision-making and to inform specific treatment recommendations. Per the patient, her husband and medical records reviewed, there has been a change in cognitive functioning and a reasonable suspicion of neurocognitive disorder.  Clinical Decision Making: In considering the patient's current level of functioning, level of presumed impairment, nature of symptoms, emotional and behavioral responses during the interview, level of literacy, and observed level of motivation, a battery of tests was selected and communicated to the psychometrician.   Following the clinical interview/neurobehavioral status exam, the patient completed this full battery of neuropsychological testing with my psychometrician under my supervision (see separate note).   PLAN: The patient will return to see me for a follow-up session at which time her test performances and my impressions and treatment recommendations will be reviewed in detail.  Evaluation ongoing; full report  to follow.

## 2018-04-29 ENCOUNTER — Encounter: Payer: Self-pay | Admitting: Psychology

## 2018-04-29 ENCOUNTER — Ambulatory Visit: Payer: Medicare Other | Admitting: Psychology

## 2018-04-29 DIAGNOSIS — R413 Other amnesia: Secondary | ICD-10-CM

## 2018-04-29 NOTE — Progress Notes (Signed)
   Neuropsychology Note  Corene CorneaLinda S Bristol completed 60 minutes of neuropsychological testing with technician, Wallace Kellerana Chamberlain, BS, under the supervision of Dr. Elvis CoilMaryBeth Bailar, Licensed Psychologist. The patient did not appear overtly distressed by the testing session, per behavioral observation or via self-report to the technician. Rest breaks were offered.   Clinical Decision Making: In considering the patient's current level of functioning, level of presumed impairment, nature of symptoms, emotional and behavioral responses during the interview, level of literacy, and observed level of motivation/effort, a battery of tests was selected and communicated to the psychometrician.  Communication between the psychologist and technician was ongoing throughout the testing session and changes were made as deemed necessary based on patient performance on testing, technician observations and additional pertinent factors such as those listed above.  Corene CorneaLinda S Peabody will return within approximately 2 weeks for an interactive feedback session with Dr. Alinda DoomsBailar at which time her test performances, clinical impressions and treatment recommendations will be reviewed in detail. The patient understands she can contact our office should she require our assistance before this time.  35 minutes spent performing neuropsychological evaluation services/clinical decision making (psychologist). [CPT 96132] 60 minutes spent face-to-face with patient administering standardized tests, 30 minutes spent scoring (technician). [CPT [id number], 96139]  Full report to follow.

## 2018-05-06 ENCOUNTER — Telehealth: Payer: Self-pay

## 2018-05-06 NOTE — Telephone Encounter (Signed)
Since last couple were normal, yearly is fine. Pt due for annual/pap.

## 2018-05-06 NOTE — Telephone Encounter (Signed)
Pt calling to see how frequently she needs to have a pap smear.  Her family hyst is such that BVD wanted her to have one done [id number].  When is she due to have one?  [phone]

## 2018-05-07 NOTE — Telephone Encounter (Signed)
Pt aware.  Tx'd to [place] to schedule annual.

## 2018-05-07 NOTE — Progress Notes (Signed)
NEUROPSYCHOLOGICAL EVALUATION   Name:    Beth Christian  Date of Birth:   01/05/1951 Date of Interview:  04/29/2018 Date of Testing:  04/29/2018   Date of Feedback:  05/09/2018       Background Information:  Reason for Referral:  Beth Christian is a 67 y.o. female referred by Dr. Lurena Joinerebecca Christian to assess her current level of cognitive functioning and assist in differential diagnosis. The current evaluation consisted of a review of available medical records, an interview with the patient and her husband, and the completion of a neuropsychological testing battery. Informed consent was obtained.  History of Presenting Problem:  Beth Christian was seen by Dr. Arbutus Leasat in 2017 for initial consult for falls and again in April 2019 for the same problem. Her neuro exam was stable, non focal and non lateralizing. Dr. Arbutus Leasat did not see any neurologic etiology for falls. Many of the falls appeared to represent reasonable loss of balance (slipping on ice, slipping getting out of the shower). Her husband also noted gradual onset of memory loss so she was referred for neurocognitive evaluation. Previous brain MRI report from 05/09/2016 noted chronic small vessel ischemia (mild progression since 2012), no acute intracranial abnormality. She does have a history of focal stroke during cardiac stent in 2012 which caused diplopia for about 6 months (resolved spontaneously).   At today's visit (04/29/2018), the patient endorses memory concerns. She and her husband report gradual onset approx 1 to 1 1/2 years ago. They report mostly short term memory difficulty. She forgets things she has been told recently and will repeat questions. She knows her husband told her something but she can't recall the answer. She also has more difficulty recalling recent events. She is a bit more distractible. She endorses word finding difficulty which is frustrating to her. She is not misplacing or losing things, not getting lost, not forgetting to  take medication. She drives locally and only on one occasion did she have uncertainty about directions (getting home from a PT appointment). She has never gotten lost. She manages her medications and does all the cooking with no difficulty. Her husband has always managed the finances. Her husband keeps her appointments in his phone and reminds her of them.  She has a family history of dementia in her mother who is alive, age 67, living at home.  The patient retired in the beginning of 2017 (she worked in Scientist, research (life sciences)school cafeterias) and has been less socially engaged since then. She reports less motivation and initiation, her husband agrees. He also notes she is a bit more insecure or attached when they are out of the house together. She stays close to him or their daughter when they are shopping, for example. She seems more fearful of being left alone. She has a history of depression and anxiety, which she feels is well managed with Wellbutrin and Celexa. She has never really had counseling/therapy for these issues. She denies suicidal ideation or intention. She denies sleep difficulty. Appetite is good.   She continues to be concerned about balance but upon direct questioning neither she nor her husband note balance difficulty on a regular basis. She has had one fall since seeing Dr. Arbutus Leasat in April, this was when she was bending down to pick tomatoes in their yard. She did not injure herself. She has never hit her head or lost consciousness in the context of any fall. Upon direct questioning she denies history of hallucinations or illusions, but does occasionally  think she sees a bug out of the corner of her eye and when she turns to look it is not there.   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 Alcohol: glass of wine 2-3 days a week Tobacco: Former smoker, quit around age 25   Medical History:  Past Medical History:  Diagnosis Date  . Articular cartilage disorder involving shoulder region, left 12/2016  . Coronary artery disease involving native coronary artery 2012   DES RCA x 2 (most recently for ISR)  . CVA (cerebral infarction) 01/25/2011   during heart cath - no residual deficits  . Depression   . Grinding of teeth   . History of CVA (cerebrovascular accident)   . HTN (hypertension)    states under control with meds., has been on med. x 6 mos.  . Hypothyroidism   . Immature cataract of both eyes   . Osteoarthritis of shoulder    left  . Raynaud's disease   . RLS (restless legs syndrome)   . Rotator cuff tear, left 12/2016  . TMJ dysfunction   . Urinary, incontinence, stress female   . Wears hearing aid in both ears     Current medications:  Outpatient Encounter Medications as of 05/09/2018  Medication Sig  . amLODipine (NORVASC) 5 MG tablet Take 5 mg by mouth daily.  Marland Kitchen aspirin EC 81 MG tablet Take 81 mg by mouth daily.  Marland Kitchen buPROPion (WELLBUTRIN XL) 150 MG 24 hr tablet Take 150 mg by mouth 3 (three) times daily.   . citalopram (CELEXA) 20 MG tablet Take 20 mg by mouth daily.    . clonazePAM (KLONOPIN) 0.5 MG tablet Take 0.5 mg by mouth at bedtime.  . fluticasone (FLONASE) 50 MCG/ACT nasal spray Place into both nostrils daily.  . hydrochlorothiazide (HYDRODIURIL) 25 MG tablet TAKE 1 TABLET (25 MG TOTAL) BY MOUTH DAILY.  Marland Kitchen levothyroxine (SYNTHROID, LEVOTHROID) 75 MCG tablet Take 75 mcg by mouth daily.    . rosuvastatin (CRESTOR) 10 MG tablet TAKE 1 TABLET (10 MG TOTAL) BY MOUTH AT BEDTIME. *REFILL 09/11/16*   No facility-administered encounter medications on file as of 05/09/2018.      Current Examination:  Behavioral Observations:  Appearance: Neatly and appropriately dressed and groomed Gait: Ambulated  independently, no gross abnormalities observed Speech: Fluent; normal rate, rhythm and volume. Minimal word finding difficulty. Thought process: Linear, goal directed Affect: Full, pleasant, mildly anxious Interpersonal: Pleasant, appropriate Orientation: Oriented to all spheres. Accurately named the current President and his predecessor.   Tests Administered: . Test of Premorbid Functioning (TOPF) . Wechsler Adult Intelligence Scale-Fourth Edition (WAIS-IV): Similarities, Clinical cytogeneticist, Coding and Digit Span subtests . Wechsler Memory Scale-Fourth Edition (WMS-IV) Older Adult Version (ages 28-90): Logical Memory I, II and Recognition subtests  . DIRECTV Verbal Learning Test - 2nd Edition (CVLT-2) Short Form . Repeatable Battery for the Assessment of Neuropsychological Status (RBANS) Form A:  Figure Copy and Recall subtests and Semantic Fluency subtest . Boston Naming Test (BNT) . Boston Diagnostic Aphasia Examination: Complex Ideational Material subtest . Controlled Oral Word Association Test (COWAT) . Trail Making Test A and B . Clock drawing test . Beck Depression Inventory - 2nd Edition (BDI-II) . Generalized Anxiety Disorder - 7 item screener (GAD-7)  Test Results: Note: Standardized scores are presented  only for use by appropriately trained professionals and to allow for any future test-retest comparison. These scores should not be interpreted without consideration of all the information that is contained in the rest of the report. The most recent standardization samples from the test publisher or other sources were used whenever possible to derive standard scores; scores were corrected for age, gender, ethnicity and education when available.   Test Scores:  Test Name Raw Score Standardized Score Descriptor  TOPF 19/70 SS= 79 Borderline  WAIS-IV Subtests     Similarities 25/36 ss= 10 Average  Block Design 29/66 ss= 9 Average  Coding 50/135 ss= 9 Average  Digit Span Forward  11/16 ss= 11 Average  Digit Span Backward 9/16 ss= 11 Average  WMS-IV Subtests     LM I 33/53 ss= 10 Average  LM II 18/39 ss= 10 Average  LM II Recognition 21/23 Cum %: >75 WNL  RBANS Subtests     Figure Copy 17/20 Z= -0.7 Average  Figure Recall 15/20 Z= 0.4 Average  Semantic Fluency 19 Z= -0.4 Average  CVLT-II Scores     Trial 1 4/9 Z= -1 Low average  Trial 4 7/9 Z= -1 Low average  Trials 1-4 total 24/36 T= 41 Low average  SD Free Recall 4/9 Z= -2 Average  LD Free Recall 5/9 Z= -1 Low average  LD Cued Recall 4/9 Z= -1.5 Borderline  Recognition Discriminability 9/9 hits 0 false positives Z= 0.5 Average  Forced Choice Recognition 9/9  WNL  BNT 47/60 T= 42 Low average  BDAE Complex Ideational Material 11/12  WNL  COWAT-FAS 20 T= 38 Low average  COWAT-Animals 15 T= 47 Average  Trail Making Test A  33" 0 errors T= 52 Average  Trail Making Test B  88" 0 errors T= 48 Average  Clock Drawing   WNL  BDI-II 15/63  Mild  GAD-7 3/21  WNL      Description of Test Results:  Premorbid verbal intellectual abilities were estimated to have been within the borderline range based on a test of word reading. This is an underestimate and may suggest history of a verbal learning disorder. On a test of verbal abstract reasoning that also estimates premorbid verbal intellectual abilities, she performed in the average range.  Psychomotor processing speed was average.   Auditory attention and working memory were average.   Visual-spatial construction was average.   Language abilities were within normal limits. Specifically, confrontation naming was low average, and semantic verbal fluency was average. Auditory comprehension of complex ideational material was within normal limits.   With regard to verbal memory, encoding and acquisition of non-contextual information (i.e., word list) was low average. After a brief distracter task, free recall was average (4/9 items). After a delay, free recall  was low average (5/9 items). She did not benefit from semantic cueing. Performance on a yes/no recognition task was average with 100% accuracy. On another verbal memory test, encoding and acquisition of contextual auditory information (i.e., short stories) was average. After a delay, free recall was average. Performance on a yes/no recognition task was nomral. With regard to non-verbal memory, delayed free recall of visual information was average.   Executive functioning was within normal limits. Mental flexibility and set-shifting were average on Trails B. Verbal fluency with phonemic search restrictions was low average. Verbal abstract reasoning was average. Performance on a clock drawing task was normal.   On a self-report measure of mood, the patient's responses were indicative of clinically significant depression at  the present time. Symptoms endorsed included: sadness, pessimism, feelings of failure, anhedonia, loss of self confidence, self-criticalness, loss of interest, indecisiveness, feelings of worthlessness, loss of energy, concentration difficulty, fatigue and reduced libido. She denied suicidal ideation or intention. On a self-report measure of anxiety, the patient did not endorse clinically significant generalized anxiety at the present time.    Clinical Impressions: No evidence of neurocognitive disorder on testing. Mild depression. Results of cognitive testing were within normal limits and commensurate with estimated premorbid intellectual abilities. I do not see any evidence of a neurocognitive disorder including Mild Cognitive Impairment. Meanwhile, she is endorsing symptoms consistent with a mild depressive episode. It is possible that depression is contributing to her cognitive difficulties in daily life. I am hopeful that more aggressive treatment of depression (I.e., counseling) would improve mood and also cognitive functioning in daily life.    Recommendations/Plan: Based on the  findings of the present evaluation, the following recommendations are offered:  1. I spoke with the patient and her husband about the impact of mood on cognitive functioning. She is open to trying psychotherapy although she does voice concerns about whether or not she would actually follow through with recommendations of a therapist. She accepted a referral to [facility].  2. The patient's husband expressed frustration with the patient's memory lapses in daily life. We discussed the importance of her using strategies such as writing down grocery lists, etc., to help her be more independent in daily life. 3. If the patient/husband feel there is worsening of memory/cognitive over time, or new symptoms, I would like to see her back in one year for re-evaluation. The current test results will serve as a nice baseline for future comparison.    Feedback to Patient: GRACIANNA VINK and her husband returned for a feedback appointment on 05/09/2018 to review the results of her neuropsychological evaluation with this provider. 20 minutes face-to-face time was spent reviewing her test results, my impressions and my recommendations as detailed above.    Total time spent on this patient's case: 70 minutes for neurobehavioral status exam with psychologist (CPT code 72536); 90 minutes of testing/scoring by psychometrician under psychologist's supervision (CPT codes [phone], [phone] units); 180 minutes for integration of patient data, interpretation of standardized test results and clinical data, clinical decision making, treatment planning and preparation of this report, and interactive feedback with review of results to the patient/family by psychologist (CPT codes [phone], [phone] units).      Thank you for your referral of CASEY MAXFIELD. Please feel free to contact me if you have any questions or concerns regarding this report.

## 2018-05-09 ENCOUNTER — Encounter: Payer: Self-pay | Admitting: Psychology

## 2018-05-09 ENCOUNTER — Ambulatory Visit: Payer: Medicare Other | Admitting: Psychology

## 2018-05-09 DIAGNOSIS — R413 Other amnesia: Secondary | ICD-10-CM | POA: Diagnosis not present

## 2018-05-09 DIAGNOSIS — F320 Major depressive disorder, single episode, mild: Secondary | ICD-10-CM

## 2018-05-09 NOTE — Patient Instructions (Signed)
Fortunately, results of cognitive testing were entirely within normal limits. There was no sign of cognitive impairment or dementia.  I am concerned that you may be experiencing some mild depression.  Depression can actually impact attention/concentration and memory in daily life.  I am hopeful that more aggressive treatment of depression and anxiety (e.g. psychotherapy) will not only improve your mood and coping, but also result in improved cognitive functioning in your daily life.  These current test results will also serve as a nice baseline for future comparison if ever needed.    The effect of depression and anxiety on your cognitive functioning: . One of the typical symptoms of depression is difficulty concentrating and making decisions, and various types of anxiety also interfere with attention and concentration . Problems with attention and concentration can disrupt the process of learning and making new memories, which can make it seem like there is a problem with your memory. In your daily life, you may experience this disruption as forgetting names and appointments, misplacing items, and needing to make lists for shopping and errands. It may be harder for you to stay focused on tasks and feel as "sharp" as you did in the past.  . Also, when we are depressed or anxious, we often pay more attention to our difficulties (rather than our strengths) in our daily life, and this can make it seem to [place] like we are doing worse cognitively than we really are. . The cognitive aspects of depression and anxiety are sometimes observed as an identifiable pattern of poor performance on a neuropsychological evaluation, but it is also possible that all scores on an evaluation are within normal limits. . Regardless of the test scores, distress related to depression and anxiety can interfere with the ability to make use of your cognitive resources and function optimally across settings such as work or school,  maintaining the home and responsibilities, and personal relationships. . Fortunately, there are treatments for depression and anxiety, and when mood improves, cognitive functioning in daily life often improves. . Treatment options include psychotherapy, medications (e.g., antidepressants), and behavioral changes, such as increasing your involvement in enjoyable activities, increasing the amount of exercise you are getting, and maintaining a regular routine.

## 2018-05-15 ENCOUNTER — Encounter: Payer: Self-pay | Admitting: Physician Assistant

## 2018-05-15 ENCOUNTER — Telehealth: Payer: Self-pay

## 2018-05-15 ENCOUNTER — Ambulatory Visit: Payer: Medicare Other | Admitting: Physician Assistant

## 2018-05-15 VITALS — BP 128/72 | HR 80 | Ht 63.0 in | Wt 164.0 lb

## 2018-05-15 DIAGNOSIS — R0609 Other forms of dyspnea: Secondary | ICD-10-CM

## 2018-05-15 DIAGNOSIS — I10 Essential (primary) hypertension: Secondary | ICD-10-CM | POA: Diagnosis not present

## 2018-05-15 DIAGNOSIS — I2510 Atherosclerotic heart disease of native coronary artery without angina pectoris: Secondary | ICD-10-CM

## 2018-05-15 DIAGNOSIS — E782 Mixed hyperlipidemia: Secondary | ICD-10-CM | POA: Diagnosis not present

## 2018-05-15 NOTE — Telephone Encounter (Signed)
Late entry from 10/1 at 1730:  Patient was scheduled to see Jacolyn Reedy, PA on 10/2. Called patient to find out why she needed a sooner appointment as there was no explanation and she already had her planned appointment with Dr. Katrinka Blazing in November. Patient states that Monday night she was at a concert and she was going up a flight of stairs and felt extremely SOB and her legs gave out and she fell. She denies LOC or head injury. She states that she was checked out by the paramedics and her EKG and BP were fine. She states that this morning she had left sided chest pain at rest. She states that she took 325 mg of ASA and the chest pain was relieved. She denies any additional Sx and is asymptomatic at this time.

## 2018-05-15 NOTE — Progress Notes (Signed)
Cardiology Office Note    Date:  05/15/2018   ID:  Beth Christian, DOB Jun 18, 1951, MRN [id number]  PCP:  Kirby Funk, MD  Cardiologist: No primary care provider on file. EPS: None  No chief complaint on file.   History of Present Illness:  Beth Christian is a 67 y.o. female CAD status post DES to the RCA in 2012 complicated by procedure related embolic stroke, hypertension, HLD.  Last 2D echo 2016 LVEF 60 to 65%, grade 1 DD, aortic sclerosis with mild to moderate AI, mild MR.  Last seen in our office 09/2017 with recurrent falls.  Referred to physical therapy for gait training.  Patient added on to my schedule today because Monday when she was at a concert she was going up a flight of stairs-being pulled by her daughter  and became very short of breath and her legs gave out she fell.  She denied LOC or head injury.  She was checked by the paramedics and EKG and blood pressure were stable. Gave her some water and she felt better. This morning she called in saying that yesterday her left chest pain at rest relieved with aspirin.Described as sharp shooting pains for a few seconds. Felt fatigue.  Ever since she retired she stopped exercising.  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  She has chronic dyspnea on exertion because she has not been exercising.  It does not seem to be any worse than it has been.  Denies chest tightness, pressure, dizziness or presyncope.  Past Medical History:  Diagnosis Date  . Articular cartilage disorder involving shoulder region, left 12/2016  . Coronary artery disease involving native coronary artery 2012   DES RCA x 2 (most recently for ISR)  . CVA (cerebral infarction) 01/25/2011   during heart cath - no residual deficits  . Depression   . Grinding of teeth   . History of CVA (cerebrovascular accident)   . HTN (hypertension)    states under control with meds., has been on med. x 6 mos.  . Hypothyroidism   . Immature cataract of both  eyes   . Osteoarthritis of shoulder    left  . Raynaud's disease   . RLS (restless legs syndrome)   . Rotator cuff tear, left 12/2016  . TMJ dysfunction   . Urinary, incontinence, stress female   . Wears hearing aid in both ears     Past Surgical History:  Procedure Laterality Date  . BREAST BIOPSY Right 09/15/2014   stromal hyalinization and small benign fibroadenoma  . COLONOSCOPY WITH PROPOFOL N/A 12/02/2012   Procedure: COLONOSCOPY WITH PROPOFOL;  Surgeon: Charolett Bumpers, MD;  Location: [facility];  Service: Endoscopy;  Laterality: N/A;  . CORONARY ANGIOPLASTY WITH STENT PLACEMENT  01/25/2011   proximal RCA  . LEFT HEART CATHETERIZATION WITH CORONARY ANGIOGRAM N/A 07/07/2011   Procedure: LEFT HEART CATHETERIZATION WITH CORONARY ANGIOGRAM;  Surgeon: [name], MD;  Location: [facility] CATH LAB;  Service: Cardiovascular;  Laterality: N/A;  Femoral  . ORIF DISTAL RADIUS FRACTURE Right 06/11/2012  . PERCUTANEOUS CORONARY STENT INTERVENTION (PCI-S) N/A 07/07/2011   Procedure: PERCUTANEOUS CORONARY STENT INTERVENTION (PCI-S);  Surgeon: [name], MD;  Location: [facility] CATH LAB;  Service: Cardiovascular;  Laterality: N/A;  . SHOULDER CLOSED REDUCTION Left 01/11/2017   Procedure: CLOSED MANIPULATION SHOULDER;  Surgeon: Loreta Ave, MD;  Location: [facility];  Service: Orthopedics;  Laterality: Left;  . TENDON  REPAIR Left    thumb  . TONSILLECTOMY     as child  . WRIST SURGERY      Current Medications: Current Meds  Medication Sig  . amLODipine (NORVASC) 5 MG tablet Take 5 mg by mouth daily.  Marland Kitchen aspirin EC 81 MG tablet Take 81 mg by mouth daily.  Marland Kitchen buPROPion (WELLBUTRIN XL) 150 MG 24 hr tablet Take 150 mg by mouth 3 (three) times daily.   . citalopram (CELEXA) 20 MG tablet Take 20 mg by mouth daily.    . clonazePAM (KLONOPIN) 0.5 MG tablet Take 0.5 mg by mouth at bedtime.  . fluticasone (FLONASE) 50 MCG/ACT nasal spray Place into both nostrils daily.  .  hydrochlorothiazide (HYDRODIURIL) 25 MG tablet TAKE 1 TABLET (25 MG TOTAL) BY MOUTH DAILY.  Marland Kitchen levothyroxine (SYNTHROID, LEVOTHROID) 75 MCG tablet Take 75 mcg by mouth daily.    . rosuvastatin (CRESTOR) 10 MG tablet TAKE 1 TABLET (10 MG TOTAL) BY MOUTH AT BEDTIME. *REFILL 09/11/16*     Allergies:   Latex   Social History   Socioeconomic History  . Marital status: Married    Spouse name: Not on file  . Number of children: Not on file  . Years of education: Not on file  . Highest education level: Not on file  Occupational History  . Occupation: retired    CommentNurse, mental health, school  Social Needs  . Financial resource strain: Not on file  . Food insecurity:    Worry: Not on file    Inability: Not on file  . Transportation needs:    Medical: Not on file    Non-medical: Not on file  Tobacco Use  . Smoking status: Former Smoker    Last attempt to quit: 07/07/1979    Years since quitting: 38.8  . Smokeless tobacco: Never Used  Substance and Sexual Activity  . Alcohol use: Yes    Comment: occasionally  . Drug use: No  . Sexual activity: Not Currently    Birth control/protection: Post-menopausal  Lifestyle  . Physical activity:    Days per week: Not on file    Minutes per session: Not on file  . Stress: Not on file  Relationships  . Social connections:    Talks on phone: Not on file    Gets together: Not on file    Attends religious service: Not on file    Active member of club or organization: Not on file    Attends meetings of clubs or organizations: Not on file    Relationship status: Not on file  Other Topics Concern  . Not on file  Social History Narrative  . Not on file     Family History:  The patient's family history includes Breast cancer (age of onset: 15) in her mother; Coronary artery disease in her father; Hypertension in her mother; Kidney failure in her father; Lymphoma in her mother; Throat cancer (age of onset: 67) in her sister.   ROS:   Please see  the history of present illness.    Review of Systems  Constitution: Positive for malaise/fatigue.  Cardiovascular: Positive for dyspnea on exertion.  Neurological: Positive for loss of balance.  Psychiatric/Behavioral: Positive for memory loss.   All other systems reviewed and are negative.   PHYSICAL EXAM:   VS:  BP 128/72   Pulse 80   Ht 5\' 3"  (1.6 m)   Wt 164 lb (74.4 kg)   BMI 29.05 kg/m   Physical Exam  GEN: Well  nourished, well developed, in no acute distress  Neck: no JVD, carotid bruits, or masses Cardiac:RRR; no murmurs, rubs, or gallops  Respiratory:  clear to auscultation bilaterally, normal work of breathing GI: soft, nontender, nondistended, + BS Ext: without cyanosis, clubbing, or edema, Good distal pulses bilaterally Neuro:  Alert and Oriented x 3 Psych: euthymic mood, full affect  Wt Readings from Last 3 Encounters:  05/15/18 164 lb (74.4 kg)  11/22/17 167 lb (75.8 kg)  10/10/17 165 lb 1.9 oz (74.9 kg)      Studies/Labs Reviewed:   EKG:  EKG is ordered today.  The ekg ordered today demonstrates normal sinus rhythm with LVH, nonspecific ST-T wave changes, no acute change  Recent Labs: 11/22/2017: TSH 1.33   Lipid Panel    Component Value Date/Time   CHOL 125 10/02/2014 0801   TRIG 55.0 10/02/2014 0801   HDL 59.30 10/02/2014 0801   CHOLHDL 2 10/02/2014 0801   VLDL 11.0 10/02/2014 0801   LDLCALC 55 10/02/2014 0801    Additional studies/ records that were reviewed today include:    Cardiac Catheterization:  01/26/11 PROCEDURE PERFORMED: 1. Left heart catheterization via the right radial approach. 2. Coronary angiography. 3. Left ventriculography. 4. Drug-eluting stent proximal RCA.   DESCRIPTION:  A 5-French sheath was inserted into the right radial artery using micropuncture technique.  We then gave 50 units per kg of heparin.  Verapamil was given intra-arterially to prevent spasm.  We then advanced into the ascending aorta over a tight J  0.035 exchange length guidewire.  Using an A2 5-French multipurpose catheter, we performed left ventriculography, right coronary angiography, and flush injections of the left coronary.  Of note is heavy focal calcification in the mid RCA, the distal left main, and the mid left anterior descending.  We used a 3.5 x 5-French left Judkins catheter for left coronary angiography.  We could not get coaxial images and changed to a 3.0 cm left coronary 5-French guide catheter for left coronary diagnostic images.  There was mild catheter damping with engagement in the left main.  Flow was brisk.   After studying the patient's images and correlating this with the patient's presenting clinical data, we decided to perform PCI on the right coronary.   We used a 5-French Judkins right guide catheter and a BMW guidewire. Angiomax bolus followed by an infusion was given.  A 600 mg of Plavix was administered.  We used a 2.5-mm x 12-mm long balloon.  Two balloon inflations were performed at 10 atmospheres.  We then positioned and deployed a 3.0 x 16-mm long Promus element stent to 12 atmospheres.  Two balloon inflations were performed.  We then postdilated with a 325 x 12- mm [place] Quantum.  Two balloon inflations to 14 atmospheres were performed. The patient had ST-segment changes with each balloon inflation but no chest pain.   The final angiographic images revealed a nice result with brisk flow.  A 400 mcg of intracoronary nitroglycerin was administered during the case. Angiomax was discontinued after the last post still balloon inflation.   A wristband was applied with good hemostasis.  No significant complications occurred during this procedure.   RESULTS:  I  Hemodynamic data: A.  Aortic pressure 143/77. B.  Left ventricular pressure 143/27 mmHg. 1. Left ventriculography:  Left ventricular cavity size and function     are normal, EF is 65%. 2. Coronary angiography.     a.     Left main  coronary:  There is 25-30%  narrowing in the      proximal left main.  The distal left main is heavily calcified.      Catheter damping is noted with engagement with Judkins catheter      due to wedging.  Left catheter damping is noted with a 5-French      EBU guide catheter.  Final grade on the left main is 20-40%      stenosis.     b.     Left anterior descending coronary:  Left anterior descending      is widely patent but with irregularities in the midvessel and      heavy calcification.  Three diagonal branches arise from the left      anterior descending.  They are all widely patent.  The LAD is      transapical.  There is up to 30-40% narrowing in the mid LAD.     c.     Circumflex artery:  Two obtuse marginal branches arise from      the circumflex.  The territory supplied is relatively small.  No      high-grade obstruction is seen.     d.     Right coronary:  The right coronary is dominant and heavily      calcified.  Gives origin to a large PDA that contains proximal 50%      narrowing.  Three left ventricular branches arise from it.  The      proximal right coronary contains somewhat concentric 95% stenosis.      There is mid vessel calcification with 50% narrowing.  TIMI flow      was grade 3. 3. PCI of the right coronary:  RCA 95%, proximal stenosis reduced to     0% with TIMI grade 3 flow using a Promus element drug-eluting     stent.  TIMI grade 3 flow was noted.   CONCLUSIONS: 1. Early positive exercise treadmill test with inferior ST elevation     secondary to high-grade proximal RCA stenosis. 2. Successful drug-eluting stent implantation in the proximal RCA     resulting in 0% stenosis and TIMI grade 3 flow. 3. Heavy calcification of both the left and right coronaries as     outlined above and 25-30% proximal left main.   PLAN:  Aggressive risk factor modification, Effient therapy for at least 12 months.  Close clinical followup.     ASSESSMENT:    1. Coronary  artery disease involving native coronary artery of native heart without angina pectoris   2. Dyspnea on exertion   3. Essential hypertension   4. Mixed hyperlipidemia      PLAN:  In order of problems listed above:  CAD status post DES to the RCA 2012 complicated by embolic stroke on aspirin and Crestor  Dyspnea on exertion and some atypical chest pain.  Suspect this is due to deconditioning.  Recommend exercise program at least 30 minutes daily and reevaluate with Dr. Katrinka Blazing at his appointment next month.  If she has any further problems between now and then could do a Lexiscan Myoview to evaluate for ischemia  Essential hypertension well-controlled  Hyperlipidemia on Crestor LDL 66 09/04/2017    Medication Adjustments/Labs and Tests Ordered: Current medicines are reviewed at length with the patient today.  Concerns regarding medicines are outlined above.  Medication changes, Labs and Tests ordered today are listed in the Patient Instructions below. Patient Instructions  Medication Instructions:  Your physician recommends that you continue on  your current medications as directed. Please refer to the Current Medication list given to you today.   Labwork: None ordered  Testing/Procedures: None ordered  Follow-Up: Keep appointment with Dr. Katrinka Blazing on 07/04/18 at 10:00 AM   Any Other Special Instructions Will Be Listed Below (If Applicable).  You should get 30 minutes of exercise a day   If you need a refill on your cardiac medications before your next appointment, please call your pharmacy.      Signed, Jacolyn Reedy, PA-C  05/15/2018 2:15 PM    [facility] [address] [place], [place], [place]  [address] Phone: [phone]; Fax: [phone]

## 2018-05-15 NOTE — Patient Instructions (Addendum)
Medication Instructions:  Your physician recommends that you continue on your current medications as directed. Please refer to the Current Medication list given to you today.   Labwork: None ordered  Testing/Procedures: None ordered  Follow-Up: Keep appointment with Dr. Katrinka Blazing on 07/04/18 at 10:00 AM   Any Other Special Instructions Will Be Listed Below (If Applicable).  You should get 30 minutes of exercise a day   If you need a refill on your cardiac medications before your next appointment, please call your pharmacy.

## 2018-05-17 ENCOUNTER — Ambulatory Visit
Admission: RE | Admit: 2018-05-17 | Discharge: 2018-05-17 | Disposition: A | Discharge status: Home / Self Care | Payer: Medicare Other | Source: Ambulatory Visit | Attending: Obstetrics and Gynecology | Admitting: Obstetrics and Gynecology

## 2018-05-17 DIAGNOSIS — Z1231 Encounter for screening mammogram for malignant neoplasm of breast: Secondary | ICD-10-CM | POA: Insufficient documentation

## 2018-05-20 ENCOUNTER — Encounter: Payer: Self-pay | Admitting: Obstetrics and Gynecology

## 2018-05-20 ENCOUNTER — Ambulatory Visit (INDEPENDENT_AMBULATORY_CARE_PROVIDER_SITE_OTHER): Payer: Medicare Other | Admitting: Obstetrics and Gynecology

## 2018-05-20 VITALS — BP 132/74 | HR 68 | Ht 63.0 in | Wt 166.0 lb

## 2018-05-20 DIAGNOSIS — Z1239 Encounter for other screening for malignant neoplasm of breast: Secondary | ICD-10-CM | POA: Diagnosis not present

## 2018-05-20 DIAGNOSIS — Z01419 Encounter for gynecological examination (general) (routine) without abnormal findings: Secondary | ICD-10-CM | POA: Diagnosis not present

## 2018-05-20 DIAGNOSIS — Z13820 Encounter for screening for osteoporosis: Secondary | ICD-10-CM

## 2018-05-20 DIAGNOSIS — Z124 Encounter for screening for malignant neoplasm of cervix: Secondary | ICD-10-CM

## 2018-05-20 NOTE — Progress Notes (Signed)
PCP: Kirby Funk, MD   Chief Complaint  Patient presents with  . Gynecologic Exam    HPI:      [name] is a 67 y.o. No obstetric history on file. who LMP was No LMP recorded. Patient is postmenopausal., presents today for her annual examination.  Her menses are absent due to menopause.  She does not have intermenstrual bleeding.  She does have occas vasomotor sx that are tolerable.  Sex activity: not sexually active. She does not have vaginal dryness.  Last Pap: 04/26/17  Results were: no abnormalities . Neg HPV DNA 1/17. HX of ASCUS/pos HPV DNA 2016. Hx of STDs: HPV There is a FH of vaginal melanoma.   Last mammogram: 05/17/18  Results were: normal--routine follow-up in 12 months There is a FH of breast cancer in her mom, genetic testing not indicated. There is no FH of ovarian cancer. The patient does not do self-breast exams.  Colonoscopy: current with PCP.  Repeat due after 10 years.   Tobacco use: quit over 30 yrs ago Alcohol use: none Exercise: not active  She does not get adequate calcium and Vitamin D in her diet. No recent DEXA.  Labs with PCP.   Past Medical History:  Diagnosis Date  . Articular cartilage disorder involving shoulder region, left 12/2016  . Coronary artery disease involving native coronary artery 2012   DES RCA x 2 (most recently for ISR)  . CVA (cerebral infarction) 01/25/2011   during heart cath - no residual deficits  . Depression   . Grinding of teeth   . History of CVA (cerebrovascular accident)   . HTN (hypertension)    states under control with meds., has been on med. x 6 mos.  . Hypothyroidism   . Immature cataract of both eyes   . Osteoarthritis of shoulder    left  . Raynaud's disease   . RLS (restless legs syndrome)   . Rotator cuff tear, left 12/2016  . TMJ dysfunction   . Urinary, incontinence, stress female   . Wears hearing aid in both ears     Past Surgical History:  Procedure Laterality Date  . BREAST  BIOPSY Right 09/15/2014   stromal hyalinization and small benign fibroadenoma  . COLONOSCOPY WITH PROPOFOL N/A 12/02/2012   Procedure: COLONOSCOPY WITH PROPOFOL;  Surgeon: Charolett Bumpers, MD;  Location: [facility];  Service: Endoscopy;  Laterality: N/A;  . CORONARY ANGIOPLASTY WITH STENT PLACEMENT  01/25/2011   proximal RCA  . LEFT HEART CATHETERIZATION WITH CORONARY ANGIOGRAM N/A 07/07/2011   Procedure: LEFT HEART CATHETERIZATION WITH CORONARY ANGIOGRAM;  Surgeon: [name], MD;  Location: [facility] CATH LAB;  Service: Cardiovascular;  Laterality: N/A;  Femoral  . ORIF DISTAL RADIUS FRACTURE Right 06/11/2012  . PERCUTANEOUS CORONARY STENT INTERVENTION (PCI-S) N/A 07/07/2011   Procedure: PERCUTANEOUS CORONARY STENT INTERVENTION (PCI-S);  Surgeon: [name], MD;  Location: [facility] CATH LAB;  Service: Cardiovascular;  Laterality: N/A;  . SHOULDER CLOSED REDUCTION Left 01/11/2017   Procedure: CLOSED MANIPULATION SHOULDER;  Surgeon: Loreta Ave, MD;  Location: [facility];  Service: Orthopedics;  Laterality: Left;  . TENDON REPAIR Left    thumb  . TONSILLECTOMY     as child  . WRIST SURGERY      Family History  Problem Relation Age of Onset  . Hypertension Mother   . Breast cancer Mother 70  . Lymphoma Mother   . Kidney failure Father   . Coronary  artery disease Father   . Throat cancer Sister 66  . Hypertension Sister   . Melanoma Other        pat aunt and pat cousin;melanoma was vaginal    Social History   Socioeconomic History  . Marital status: Married    Spouse name: Not on file  . Number of children: Not on file  . Years of education: Not on file  . Highest education level: Not on file  Occupational History  . Occupation: retired    CommentNurse, mental health, school  Social Needs  . Financial resource strain: Not on file  . Food insecurity:    Worry: Not on file    Inability: Not on file  . Transportation needs:    Medical: Not on file     Non-medical: Not on file  Tobacco Use  . Smoking status: Former Smoker    Last attempt to quit: 07/07/1979    Years since quitting: 38.8  . Smokeless tobacco: Never Used  Substance and Sexual Activity  . Alcohol use: Yes    Comment: occasionally  . Drug use: No  . Sexual activity: Not Currently    Birth control/protection: Post-menopausal  Lifestyle  . Physical activity:    Days per week: Not on file    Minutes per session: Not on file  . Stress: Not on file  Relationships  . Social connections:    Talks on phone: Not on file    Gets together: Not on file    Attends religious service: Not on file    Active member of club or organization: Not on file    Attends meetings of clubs or organizations: Not on file    Relationship status: Not on file  . Intimate partner violence:    Fear of current or ex partner: Not on file    Emotionally abused: Not on file    Physically abused: Not on file    Forced sexual activity: Not on file  Other Topics Concern  . Not on file  Social History Narrative  . Not on file    Current Meds  Medication Sig  . amLODipine (NORVASC) 5 MG tablet Take 5 mg by mouth daily.  Marland Kitchen aspirin EC 81 MG tablet Take 81 mg by mouth daily.  Marland Kitchen buPROPion (WELLBUTRIN XL) 150 MG 24 hr tablet Take 150 mg by mouth 3 (three) times daily.   . citalopram (CELEXA) 20 MG tablet Take 20 mg by mouth daily.    . clonazePAM (KLONOPIN) 0.5 MG tablet Take 0.5 mg by mouth at bedtime.  . fluticasone (FLONASE) 50 MCG/ACT nasal spray Place into both nostrils daily.  . hydrochlorothiazide (HYDRODIURIL) 25 MG tablet TAKE 1 TABLET (25 MG TOTAL) BY MOUTH DAILY.  Marland Kitchen levothyroxine (SYNTHROID, LEVOTHROID) 75 MCG tablet Take 75 mcg by mouth daily.    . rosuvastatin (CRESTOR) 10 MG tablet TAKE 1 TABLET (10 MG TOTAL) BY MOUTH AT BEDTIME. *REFILL 09/11/16*      ROS:  Review of Systems  Constitutional: Negative for fatigue, fever and unexpected weight change.  Respiratory: Positive for  shortness of breath. Negative for cough and wheezing.   Cardiovascular: Negative for chest pain, palpitations and leg swelling.  Gastrointestinal: Negative for blood in stool, constipation, diarrhea, nausea and vomiting.  Endocrine: Negative for cold intolerance, heat intolerance and polyuria.  Genitourinary: Negative for dyspareunia, dysuria, flank pain, frequency, genital sores, hematuria, menstrual problem, pelvic pain, urgency, vaginal bleeding, vaginal discharge and vaginal pain.  Musculoskeletal: Negative for back pain, joint  swelling and myalgias.  Skin: Negative for rash.  Neurological: Negative for dizziness, syncope, light-headedness, numbness and headaches.  Hematological: Negative for adenopathy.  Psychiatric/Behavioral: Positive for dysphoric mood. Negative for agitation, confusion, sleep disturbance and suicidal ideas. The patient is not nervous/anxious.      Objective: BP 132/74   Pulse 68   Ht 5\' 3"  (1.6 m)   Wt 166 lb (75.3 kg)   BMI 29.41 kg/m    Physical Exam  Constitutional: She is oriented to person, place, and time. She appears well-developed and well-nourished.  Genitourinary: Vagina normal and uterus normal. There is no rash or tenderness on the right labia. There is no rash or tenderness on the left labia. No erythema or tenderness in the vagina. No vaginal discharge found. Right adnexum does not display mass and does not display tenderness. Left adnexum does not display mass and does not display tenderness. Cervix does not exhibit motion tenderness or polyp. Uterus is not enlarged or tender.  Neck: Normal range of motion. No thyromegaly present.  Cardiovascular: Normal rate, regular rhythm and normal heart sounds.  No murmur heard. Pulmonary/Chest: Effort normal and breath sounds normal. Right breast exhibits no mass, no nipple discharge, no skin change and no tenderness. Left breast exhibits no mass, no nipple discharge, no skin change and no tenderness.    Abdominal: Soft. There is no tenderness. There is no guarding.  Musculoskeletal: Normal range of motion.  Neurological: She is alert and oriented to person, place, and time. No cranial nerve deficit.  Psychiatric: She has a normal mood and affect. Her behavior is normal.  Vitals reviewed.   Assessment/Plan:  Encounter for annual routine gynecological examination  Cervical cancer screening - Plan: Pap IG (Image Guided)  Screening for breast cancer - Pt current on mammo  Screening for osteoporosis - Plan: DG Bone Density         GYN counsel mammography screening, menopause, osteoporosis, adequate intake of calcium and vitamin D, diet and exercise    F/U  Return in about 1 year (around 05/21/2019).  Cayleen Benjamin B. Aleicia Kenagy, PA-C 05/20/2018 10:42 AM

## 2018-05-20 NOTE — Patient Instructions (Signed)
I value your feedback and entrusting us with your care. If you get a [facility] patient survey, I would appreciate you taking the time to let us know about your experience today. Thank you! 

## 2018-05-22 LAB — PAP IG (IMAGE GUIDED): PAP SMEAR COMMENT: 0

## 2018-05-27 ENCOUNTER — Ambulatory Visit: Payer: Medicare Other | Admitting: Psychology

## 2018-06-18 ENCOUNTER — Other Ambulatory Visit: Payer: Self-pay | Admitting: Geriatric Medicine

## 2018-06-18 ENCOUNTER — Ambulatory Visit
Admission: RE | Admit: 2018-06-18 | Discharge: 2018-06-18 | Disposition: A | Discharge status: Home / Self Care | Payer: Medicare Other | Source: Ambulatory Visit | Attending: Geriatric Medicine | Admitting: Geriatric Medicine

## 2018-06-18 DIAGNOSIS — R0789 Other chest pain: Secondary | ICD-10-CM

## 2018-07-03 NOTE — Progress Notes (Signed)
Cardiology Office Note:    Date:  07/04/2018   ID:  Beth Christian, DOB 10-06-1950, MRN [id number]  PCP:  Kirby Funk, MD  Cardiologist:  [name], MD   Referring MD: Kirby Funk, MD   Chief Complaint  Patient presents with  . Coronary Artery Disease    History of Present Illness:    Beth Christian is a 67 y.o. female with a hx of CAD with RCA DES 2012, procedure related embolic stroke, hypertension, and hyperlipidemia.  She is here with her husband.  She no longer drives.  It is not because of vision.  She had double vision 6 or 7 years ago after a intervention related embolic CVA.  Double vision has completely resolved.  Her difficulty now is with hearing, and significant change in memory.  Very forgetful according to the husband.  It is causing family strife.  She denies angina.  She has not needed nitroglycerin.  She has not had syncope.  She does have dyspnea on exertion.  She has stopped exercising and according to her husband, seems to have no desire to exercise or wear a fit bit.  There is also difficulty with balance making ambulation somewhat more difficult.  She denies pain in the legs associated with activity compatible with PAD.  Past Medical History:  Diagnosis Date  . Articular cartilage disorder involving shoulder region, left 12/2016  . Coronary artery disease involving native coronary artery 2012   DES RCA x 2 (most recently for ISR)  . CVA (cerebral infarction) 01/25/2011   during heart cath - no residual deficits  . Depression   . Grinding of teeth   . History of CVA (cerebrovascular accident)   . HTN (hypertension)    states under control with meds., has been on med. x 6 mos.  . Hypothyroidism   . Immature cataract of both eyes   . Osteoarthritis of shoulder    left  . Raynaud's disease   . RLS (restless legs syndrome)   . Rotator cuff tear, left 12/2016  . TMJ dysfunction   . Urinary, incontinence, stress female   . Wears hearing aid  in both ears     Past Surgical History:  Procedure Laterality Date  . BREAST BIOPSY Right 09/15/2014   stromal hyalinization and small benign fibroadenoma  . COLONOSCOPY WITH PROPOFOL N/A 12/02/2012   Procedure: COLONOSCOPY WITH PROPOFOL;  Surgeon: Charolett Bumpers, MD;  Location: [facility];  Service: Endoscopy;  Laterality: N/A;  . CORONARY ANGIOPLASTY WITH STENT PLACEMENT  01/25/2011   proximal RCA  . LEFT HEART CATHETERIZATION WITH CORONARY ANGIOGRAM N/A 07/07/2011   Procedure: LEFT HEART CATHETERIZATION WITH CORONARY ANGIOGRAM;  Surgeon: [name], MD;  Location: [facility] CATH LAB;  Service: Cardiovascular;  Laterality: N/A;  Femoral  . ORIF DISTAL RADIUS FRACTURE Right 06/11/2012  . PERCUTANEOUS CORONARY STENT INTERVENTION (PCI-S) N/A 07/07/2011   Procedure: PERCUTANEOUS CORONARY STENT INTERVENTION (PCI-S);  Surgeon: [name], MD;  Location: [facility] CATH LAB;  Service: Cardiovascular;  Laterality: N/A;  . SHOULDER CLOSED REDUCTION Left 01/11/2017   Procedure: CLOSED MANIPULATION SHOULDER;  Surgeon: Loreta Ave, MD;  Location: [facility];  Service: Orthopedics;  Laterality: Left;  . TENDON REPAIR Left    thumb  . TONSILLECTOMY     as child  . WRIST SURGERY      Current Medications: Current Meds  Medication Sig  . amLODipine (NORVASC) 5 MG tablet Take 5 mg by mouth  daily.  . aspirin EC 81 MG tablet Take 81 mg by mouth daily.  Marland Kitchen buPROPion (WELLBUTRIN XL) 150 MG 24 hr tablet Take 150 mg by mouth 3 (three) times daily.   . citalopram (CELEXA) 20 MG tablet Take 20 mg by mouth daily.    . clonazePAM (KLONOPIN) 0.5 MG tablet Take 0.5 mg by mouth at bedtime.  . fluticasone (FLONASE) 50 MCG/ACT nasal spray Place into both nostrils daily.  . hydrochlorothiazide (HYDRODIURIL) 25 MG tablet TAKE 1 TABLET (25 MG TOTAL) BY MOUTH DAILY.  Marland Kitchen levothyroxine (SYNTHROID, LEVOTHROID) 75 MCG tablet Take 75 mcg by mouth daily.    . rosuvastatin (CRESTOR) 10 MG tablet TAKE 1  TABLET (10 MG TOTAL) BY MOUTH AT BEDTIME. *REFILL 09/11/16*     Allergies:   Latex   Social History   Socioeconomic History  . Marital status: Married    Spouse name: Not on file  . Number of children: Not on file  . Years of education: Not on file  . Highest education level: Not on file  Occupational History  . Occupation: retired    CommentNurse, mental health, school  Social Needs  . Financial resource strain: Not on file  . Food insecurity:    Worry: Not on file    Inability: Not on file  . Transportation needs:    Medical: Not on file    Non-medical: Not on file  Tobacco Use  . Smoking status: Former Smoker    Last attempt to quit: 07/07/1979    Years since quitting: 39.0  . Smokeless tobacco: Never Used  Substance and Sexual Activity  . Alcohol use: Yes    Comment: occasionally  . Drug use: No  . Sexual activity: Not Currently    Birth control/protection: Post-menopausal  Lifestyle  . Physical activity:    Days per week: Not on file    Minutes per session: Not on file  . Stress: Not on file  Relationships  . Social connections:    Talks on phone: Not on file    Gets together: Not on file    Attends religious service: Not on file    Active member of club or organization: Not on file    Attends meetings of clubs or organizations: Not on file    Relationship status: Not on file  Other Topics Concern  . Not on file  Social History Narrative  . Not on file     Family History: The patient's family history includes Breast cancer (age of onset: 71) in her mother; Coronary artery disease in her father; Hypertension in her mother and sister; Kidney failure in her father; Lymphoma in her mother; Melanoma in her other; Throat cancer (age of onset: 47) in her sister.  ROS:   Please see the history of present illness.    Depression, wheezing, snoring, decreased hearing, and difficulty with balance.  All other systems reviewed and are negative.  EKGs/Labs/Other Studies  Reviewed:    The following studies were reviewed today:  Reviewed the 2017 MRI of the brain which demonstrated small vessel disease with slight progression compared to 5 years prior. IMPRESSION: 1. Short-segment mild atheromatous stenosis at the origin of the proximal left ICA. Left carotid artery system otherwise widely patent. No flow-limiting or critical stenosis within the left carotid artery system. 2. Widely patent right carotid artery system without stenosis. 3. Moderate stenosis at the origin of the left vertebral artery, with more mild narrowing at the origin of the right vertebral artery. Otherwise  widely patent vertebral arteries within the neck.  EKG:  EKG is  ordered today.  The ekg performed May 15, 2018 demonstrated normal sinus rhythm with prominent voltage and nonspecific ST abnormality unchanged from prior studies.  Recent Labs: 11/22/2017: TSH 1.33  Recent Lipid Panel    Component Value Date/Time   CHOL 125 10/02/2014 0801   TRIG 55.0 10/02/2014 0801   HDL 59.30 10/02/2014 0801   CHOLHDL 2 10/02/2014 0801   VLDL 11.0 10/02/2014 0801   LDLCALC 55 10/02/2014 0801    Physical Exam:    VS:  BP 124/72   Pulse 74   Ht 5\' 3"  (1.6 m)   Wt 164 lb 12.8 oz (74.8 kg)   SpO2 92%   BMI 29.19 kg/m     Wt Readings from Last 3 Encounters:  07/04/18 164 lb 12.8 oz (74.8 kg)  05/20/18 166 lb (75.3 kg)  05/15/18 164 lb (74.4 kg)     GEN: Well nourished, well developed in no acute distress HEENT: Normal NECK: No JVD. LYMPHATICS: No lymphadenopathy CARDIAC: RRR, no murmur, no gallop, no edema. VASCULAR: 2+ bilateral radial and posterior tibial bilateral pulses.  No bruits. RESPIRATORY:  Clear to auscultation without rales, wheezing or rhonchi  ABDOMEN: Soft, non-tender, non-distended, No pulsatile mass, MUSCULOSKELETAL: No deformity  SKIN: Warm and dry NEUROLOGIC:  Alert and oriented x 3 PSYCHIATRIC:  Normal affect   ASSESSMENT:    1. Coronary artery  disease of native artery of native heart with stable angina pectoris (HCC)   2. Memory deficit   3. Mixed hyperlipidemia   4. Essential hypertension   5. Other depression   6. Cerebral infarction due to thrombosis of precerebral artery (HCC)    PLAN:    In order of problems listed above:  1. Dyspnea is probably due to deconditioning.  No actual chest discomfort or symptoms to suggest angina.  She denies orthopnea.  I encouraged moderate aerobic activity starting gradually and progressing.  If she is unable to notice improvement with consistent efforts, she should then have a myocardial perfusion study. 2. I have recommended that they consider second opinion neurology evaluation from [facility]. 3. LDL target should be less than 70.  Low-fat diet and statin therapy discussed. 4. Target blood pressure 130/80 mmHg or less.  Low-salt diet discussed. 5. Moderate aerobic activity could improve depression and be good for memory.  Therefore, re-stressed attempting to achieve at least 150 minutes of moderate activity per month. 6. With memory issues, difficulty with balance, prior embolic CVA, reevaluation by neurology and perhaps a second opinion of currently available data would be helpful.  Overall education and awareness concerning primary/secondary risk prevention was discussed in detail: LDL less than 70, hemoglobin A1c less than 7, blood pressure target less than 130/80 mmHg, >150 minutes of moderate aerobic activity per week, avoidance of smoking, weight control (via diet and exercise), and continued surveillance/management of/for obstructive sleep apnea.   Greater than 50% of the time during this office visit was spent in education, counseling, and coordination of care related to underlying disease process and testing as outlined.    Medication Adjustments/Labs and Tests Ordered: Current medicines are reviewed at length with the patient today.   Concerns regarding medicines are outlined above.  No orders of the defined types were placed in this encounter.  No orders of the defined types were placed in this encounter.   Patient Instructions  Medication Instructions:  Your physician recommends that you continue on your  current medications as directed. Please refer to the Current Medication list given to you today.  If you need a refill on your cardiac medications before your next appointment, please call your pharmacy.   Lab work: None If you have labs (blood work) drawn today and your tests are completely normal, you will receive your results only by: Marland Kitchen. MyChart Message (if you have MyChart) OR . A paper copy in the mail If you have any lab test that is abnormal or we need to change your treatment, we will call you to review the results.  Testing/Procedures: None  Follow-Up: At [facility], you and your health needs are our priority.  As part of our continuing mission to provide you with exceptional heart care, we have created designated Provider Care Teams.  These Care Teams include your primary Cardiologist (physician) and Advanced Practice Providers (APPs -  Physician Assistants and Nurse Practitioners) who all work together to provide you with the care you need, when you need it. You will need a follow up appointment in 12 months.  Please call our office 2 months in advance to schedule this appointment.  You may see Lesleigh NoeHenry W Smith III, MD or one of the following Advanced Practice Providers on your designated Care Team:   Norma FredricksonLori Gerhardt, NP Nada BoozerLaura Ingold, NP . Georgie ChardJill McDaniel, NP  Any Other Special Instructions Will Be Listed Below (If Applicable).       Signed, Lesleigh NoeHenry W Smith III, MD  07/04/2018 10:57 AM    [facility]

## 2018-07-04 ENCOUNTER — Ambulatory Visit: Payer: Medicare Other | Admitting: Interventional Cardiology

## 2018-07-04 ENCOUNTER — Encounter: Payer: Self-pay | Admitting: Interventional Cardiology

## 2018-07-04 VITALS — BP 124/72 | HR 74 | Ht 63.0 in | Wt 164.8 lb

## 2018-07-04 DIAGNOSIS — I25118 Atherosclerotic heart disease of native coronary artery with other forms of angina pectoris: Secondary | ICD-10-CM | POA: Diagnosis not present

## 2018-07-04 DIAGNOSIS — R413 Other amnesia: Secondary | ICD-10-CM

## 2018-07-04 DIAGNOSIS — F3289 Other specified depressive episodes: Secondary | ICD-10-CM

## 2018-07-04 DIAGNOSIS — E782 Mixed hyperlipidemia: Secondary | ICD-10-CM

## 2018-07-04 DIAGNOSIS — I6300 Cerebral infarction due to thrombosis of unspecified precerebral artery: Secondary | ICD-10-CM

## 2018-07-04 DIAGNOSIS — I10 Essential (primary) hypertension: Secondary | ICD-10-CM | POA: Diagnosis not present

## 2018-07-04 NOTE — Patient Instructions (Signed)

## 2018-07-15 ENCOUNTER — Ambulatory Visit: Payer: Medicare Other | Admitting: Psychology

## 2018-07-30 ENCOUNTER — Ambulatory Visit: Payer: Self-pay | Admitting: Psychology

## 2018-08-12 ENCOUNTER — Ambulatory Visit: Payer: Self-pay | Admitting: Psychology

## 2018-10-11 ENCOUNTER — Other Ambulatory Visit: Payer: Self-pay | Admitting: Physician Assistant

## 2019-04-07 ENCOUNTER — Other Ambulatory Visit: Payer: Self-pay | Admitting: Internal Medicine

## 2019-04-07 DIAGNOSIS — Z1231 Encounter for screening mammogram for malignant neoplasm of breast: Secondary | ICD-10-CM

## 2019-05-19 ENCOUNTER — Ambulatory Visit
Admission: RE | Admit: 2019-05-19 | Discharge: 2019-05-19 | Disposition: A | Discharge status: Home / Self Care | Payer: Medicare Other | Source: Ambulatory Visit | Attending: Internal Medicine | Admitting: Internal Medicine

## 2019-05-19 DIAGNOSIS — Z1231 Encounter for screening mammogram for malignant neoplasm of breast: Secondary | ICD-10-CM | POA: Insufficient documentation

## 2019-05-21 NOTE — Progress Notes (Signed)
PCP: Kirby Funk, MD   Chief Complaint  Patient presents with  . Gynecologic Exam    HPI:      [name] is a 68 y.o. No obstetric history on file. who LMP was No LMP recorded. Patient is postmenopausal., presents today for her annual examination.  Her menses are absent due to menopause.  She does not have intermenstrual bleeding.  She does have occas vasomotor sx that are tolerable.  Sex activity: not sexually active. She does not have vaginal dryness.  Last Pap: 05/20/18  Results were: no abnormalities . Neg HPV DNA 1/17. HX of ASCUS/pos HPV DNA 2016.  Hx of STDs: HPV There is a FH of vaginal melanoma.   Last mammogram: 05/19/19  Results were: normal--routine follow-up in 12 months There is a FH of breast cancer in her mom, genetic testing not indicated. There is a possible FH of ovarian cancer in a pat cousin and pat aunt, but unsure if cx vs uterine. The patient does not do self-breast exams.  Colonoscopy: current with PCP.  Repeat due after 10 years.   Tobacco use: quit over 30 yrs ago Alcohol use: none  No drug use. Exercise: not active  She does not get adequate calcium and Vitamin D in her diet. No recent DEXA. Didn't have done last yr.  Labs with PCP.   Past Medical History:  Diagnosis Date  . Articular cartilage disorder involving shoulder region, left 12/2016  . Coronary artery disease involving native coronary artery 2012   DES RCA x 2 (most recently for ISR)  . CVA (cerebral infarction) 01/25/2011   during heart cath - no residual deficits  . Depression   . Grinding of teeth   . History of CVA (cerebrovascular accident)   . HTN (hypertension)    states under control with meds., has been on med. x 6 mos.  . Hypothyroidism   . Immature cataract of both eyes   . Osteoarthritis of shoulder    left  . Raynaud's disease   . RLS (restless legs syndrome)   . Rotator cuff tear, left 12/2016  . TMJ dysfunction   . Urinary, incontinence, stress  female   . Wears hearing aid in both ears     Past Surgical History:  Procedure Laterality Date  . BREAST BIOPSY Right 09/15/2014   stromal hyalinization and small benign fibroadenoma  . COLONOSCOPY WITH PROPOFOL N/A 12/02/2012   Procedure: COLONOSCOPY WITH PROPOFOL;  Surgeon: Charolett Bumpers, MD;  Location: [facility];  Service: Endoscopy;  Laterality: N/A;  . CORONARY ANGIOPLASTY WITH STENT PLACEMENT  01/25/2011   proximal RCA  . LEFT HEART CATHETERIZATION WITH CORONARY ANGIOGRAM N/A 07/07/2011   Procedure: LEFT HEART CATHETERIZATION WITH CORONARY ANGIOGRAM;  Surgeon: [name], MD;  Location: [facility] CATH LAB;  Service: Cardiovascular;  Laterality: N/A;  Femoral  . ORIF DISTAL RADIUS FRACTURE Right 06/11/2012  . PERCUTANEOUS CORONARY STENT INTERVENTION (PCI-S) N/A 07/07/2011   Procedure: PERCUTANEOUS CORONARY STENT INTERVENTION (PCI-S);  Surgeon: [name], MD;  Location: [facility] CATH LAB;  Service: Cardiovascular;  Laterality: N/A;  . SHOULDER CLOSED REDUCTION Left 01/11/2017   Procedure: CLOSED MANIPULATION SHOULDER;  Surgeon: Loreta Ave, MD;  Location: [facility];  Service: Orthopedics;  Laterality: Left;  . TENDON REPAIR Left    thumb  . TONSILLECTOMY     as child  . WRIST SURGERY      Family History  Problem Relation Age of Onset  .  Hypertension Mother   . Breast cancer Mother 562  . Lymphoma Mother   . Kidney failure Father   . Coronary artery disease Father   . Throat cancer Sister 1760  . Hypertension Sister   . Melanoma Other   . Ovarian cancer Paternal Aunt        ? ovar vs cx vs uterine  . Melanoma Paternal Aunt     Social History   Socioeconomic History  . Marital status: Married    Spouse name: Not on file  . Number of children: Not on file  . Years of education: Not on file  . Highest education level: Not on file  Occupational History  . Occupation: retired    CommentNurse, mental health: cafeteria, school  Social Needs  . Financial resource  strain: Not on file  . Food insecurity    Worry: Not on file    Inability: Not on file  . Transportation needs    Medical: Not on file    Non-medical: Not on file  Tobacco Use  . Smoking status: Former Smoker    Quit date: 07/07/1979    Years since quitting: 39.9  . Smokeless tobacco: Never Used  Substance and Sexual Activity  . Alcohol use: Yes    Comment: occasionally  . Drug use: No  . Sexual activity: Not Currently    Birth control/protection: Post-menopausal  Lifestyle  . Physical activity    Days per week: Not on file    Minutes per session: Not on file  . Stress: Not on file  Relationships  . Social Musicianconnections    Talks on phone: Not on file    Gets together: Not on file    Attends religious service: Not on file    Active member of club or organization: Not on file    Attends meetings of clubs or organizations: Not on file    Relationship status: Not on file  . Intimate partner violence    Fear of current or ex partner: Not on file    Emotionally abused: Not on file    Physically abused: Not on file    Forced sexual activity: Not on file  Other Topics Concern  . Not on file  Social History Narrative  . Not on file    Current Meds  Medication Sig  . amLODipine (NORVASC) 5 MG tablet Take 5 mg by mouth daily.  Marland Kitchen. aspirin EC 81 MG tablet Take 81 mg by mouth daily.  . Azelastine HCl 0.15 % SOLN SPRAY 1 SPRAY INTO EACH NOSTRIL EVERY DAY  . buPROPion (WELLBUTRIN XL) 150 MG 24 hr tablet Take 150 mg by mouth 3 (three) times daily.   . citalopram (CELEXA) 20 MG tablet Take 20 mg by mouth daily.    . clonazePAM (KLONOPIN) 1 MG tablet TAKE 1 TABLET BY MOUTH EVERYDAY AT BEDTIME  . hydrochlorothiazide (HYDRODIURIL) 25 MG tablet TAKE 1 TABLET BY MOUTH EVERY DAY  . levothyroxine (SYNTHROID, LEVOTHROID) 75 MCG tablet Take 75 mcg by mouth daily.    . rosuvastatin (CRESTOR) 10 MG tablet TAKE 1 TABLET (10 MG TOTAL) BY MOUTH AT BEDTIME. *REFILL 09/11/16*      ROS:  Review  of Systems  Constitutional: Negative for fatigue, fever and unexpected weight change.  Respiratory: Negative for cough, shortness of breath and wheezing.   Cardiovascular: Negative for chest pain, palpitations and leg swelling.  Gastrointestinal: Negative for blood in stool, constipation, diarrhea, nausea and vomiting.  Endocrine: Negative for cold intolerance, heat intolerance and  polyuria.  Genitourinary: Negative for dyspareunia, dysuria, flank pain, frequency, genital sores, hematuria, menstrual problem, pelvic pain, urgency, vaginal bleeding, vaginal discharge and vaginal pain.  Musculoskeletal: Negative for back pain, joint swelling and myalgias.  Skin: Negative for rash.  Neurological: Negative for dizziness, syncope, light-headedness, numbness and headaches.  Hematological: Negative for adenopathy.  Psychiatric/Behavioral: Positive for dysphoric mood. Negative for agitation, confusion, sleep disturbance and suicidal ideas. The patient is not nervous/anxious.      Objective: BP 120/78   Ht 5\' 3"  (1.6 m)   Wt 166 lb (75.3 kg)   BMI 29.41 kg/m    Physical Exam Constitutional:      Appearance: She is well-developed.  Genitourinary:     Vulva, vagina, uterus, right adnexa and left adnexa normal.     No vulval lesion or tenderness noted.     No vaginal discharge, erythema or tenderness.     No cervical motion tenderness or polyp.     Uterus is not enlarged or tender.     No right or left adnexal mass present.     Right adnexa not tender.     Left adnexa not tender.  Neck:     Musculoskeletal: Normal range of motion.     Thyroid: No thyromegaly.  Cardiovascular:     Rate and Rhythm: Normal rate and regular rhythm.     Heart sounds: Normal heart sounds. No murmur.  Pulmonary:     Effort: Pulmonary effort is normal.     Breath sounds: Normal breath sounds.  Chest:     Breasts:        Right: No mass, nipple discharge, skin change or tenderness.        Left: No mass,  nipple discharge, skin change or tenderness.  Abdominal:     Palpations: Abdomen is soft.     Tenderness: There is no abdominal tenderness. There is no guarding.  Musculoskeletal: Normal range of motion.  Neurological:     General: No focal deficit present.     Mental Status: She is alert and oriented to person, place, and time.     Cranial Nerves: No cranial nerve deficit.  Skin:    General: Skin is warm and dry.  Psychiatric:        Mood and Affect: Mood normal.        Behavior: Behavior normal.        Thought Content: Thought content normal.        Judgment: Judgment normal.  Vitals signs reviewed.     Assessment/Plan:  Encounter for annual routine gynecological examination  Cervical cancer screening - Plan: Cytology - PAP  History of abnormal cervical Pap smear - Plan: Cytology - PAP; will call pt with results and dispo.  Encounter for screening mammogram for malignant neoplasm of breast--pt current on mammo  Screening for osteoporosis - Plan: DG Bone Density         GYN counsel mammography screening, menopause, osteoporosis, adequate intake of calcium and vitamin D, diet and exercise    F/U  Return if symptoms worsen or fail to improve.  Qianna Clagett B. Jaydin Jalomo, PA-C 05/22/2019 10:45 AM

## 2019-05-22 ENCOUNTER — Encounter: Payer: Self-pay | Admitting: Obstetrics and Gynecology

## 2019-05-22 ENCOUNTER — Ambulatory Visit (INDEPENDENT_AMBULATORY_CARE_PROVIDER_SITE_OTHER): Payer: Medicare Other | Admitting: Obstetrics and Gynecology

## 2019-05-22 ENCOUNTER — Other Ambulatory Visit (HOSPITAL_COMMUNITY)
Admission: RE | Admit: 2019-05-22 | Discharge: 2019-05-22 | Disposition: A | Discharge status: Home / Self Care | Payer: Medicare Other | Source: Ambulatory Visit | Attending: Obstetrics and Gynecology | Admitting: Obstetrics and Gynecology

## 2019-05-22 ENCOUNTER — Other Ambulatory Visit: Payer: Self-pay

## 2019-05-22 VITALS — BP 120/78 | Ht 63.0 in | Wt 166.0 lb

## 2019-05-22 DIAGNOSIS — Z8742 Personal history of other diseases of the female genital tract: Secondary | ICD-10-CM | POA: Diagnosis present

## 2019-05-22 DIAGNOSIS — Z13820 Encounter for screening for osteoporosis: Secondary | ICD-10-CM

## 2019-05-22 DIAGNOSIS — Z01419 Encounter for gynecological examination (general) (routine) without abnormal findings: Secondary | ICD-10-CM

## 2019-05-22 DIAGNOSIS — Z124 Encounter for screening for malignant neoplasm of cervix: Secondary | ICD-10-CM | POA: Insufficient documentation

## 2019-05-22 DIAGNOSIS — Z1231 Encounter for screening mammogram for malignant neoplasm of breast: Secondary | ICD-10-CM

## 2019-05-22 NOTE — Patient Instructions (Signed)
I value your feedback and entrusting us with your care. If you get a [facility] patient survey, I would appreciate you taking the time to let us know about your experience today. Thank you! 

## 2019-05-26 LAB — CYTOLOGY - PAP: Diagnosis: NEGATIVE

## 2019-07-13 NOTE — Progress Notes (Signed)
Cardiology Office Note:    Date:  07/14/2019   ID:  Beth Christian, DOB 07-30-1951, MRN [id number]  PCP:  Kirby Funk, MD  Cardiologist:  [name], MD   Referring MD: Kirby Funk, MD   Chief Complaint  Patient presents with  . Coronary Artery Disease    History of Present Illness:    Beth Christian is a 68 y.o. female with a hx of CAD with RCA DES 2012, procedure related embolic stroke, hypertension, and hyperlipidemia.  She is having no cardiopulmonary complaints.  She is having some difficulty with her life and that she has relatively frequent difficulty with balance which causes falls.  She also feels depressed and stressed at times.  These mood changes are not associated with any cardiac complaints.  Past Medical History:  Diagnosis Date  . Articular cartilage disorder involving shoulder region, left 12/2016  . Coronary artery disease involving native coronary artery 2012   DES RCA x 2 (most recently for ISR)  . CVA (cerebral infarction) 01/25/2011   during heart cath - no residual deficits  . Depression   . Grinding of teeth   . History of CVA (cerebrovascular accident)   . HTN (hypertension)    states under control with meds., has been on med. x 6 mos.  . Hypothyroidism   . Immature cataract of both eyes   . Osteoarthritis of shoulder    left  . Raynaud's disease   . RLS (restless legs syndrome)   . Rotator cuff tear, left 12/2016  . TMJ dysfunction   . Urinary, incontinence, stress female   . Wears hearing aid in both ears     Past Surgical History:  Procedure Laterality Date  . BREAST BIOPSY Right 09/15/2014   stromal hyalinization and small benign fibroadenoma  . COLONOSCOPY WITH PROPOFOL N/A 12/02/2012   Procedure: COLONOSCOPY WITH PROPOFOL;  Surgeon: Charolett Bumpers, MD;  Location: [facility];  Service: Endoscopy;  Laterality: N/A;  . CORONARY ANGIOPLASTY WITH STENT PLACEMENT  01/25/2011   proximal RCA  . LEFT HEART CATHETERIZATION  WITH CORONARY ANGIOGRAM N/A 07/07/2011   Procedure: LEFT HEART CATHETERIZATION WITH CORONARY ANGIOGRAM;  Surgeon: [name], MD;  Location: [facility] CATH LAB;  Service: Cardiovascular;  Laterality: N/A;  Femoral  . ORIF DISTAL RADIUS FRACTURE Right 06/11/2012  . PERCUTANEOUS CORONARY STENT INTERVENTION (PCI-S) N/A 07/07/2011   Procedure: PERCUTANEOUS CORONARY STENT INTERVENTION (PCI-S);  Surgeon: [name], MD;  Location: [facility] CATH LAB;  Service: Cardiovascular;  Laterality: N/A;  . SHOULDER CLOSED REDUCTION Left 01/11/2017   Procedure: CLOSED MANIPULATION SHOULDER;  Surgeon: Loreta Ave, MD;  Location: [facility];  Service: Orthopedics;  Laterality: Left;  . TENDON REPAIR Left    thumb  . TONSILLECTOMY     as child  . WRIST SURGERY      Current Medications: Current Meds  Medication Sig  . amLODipine (NORVASC) 5 MG tablet Take 5 mg by mouth daily.  Marland Kitchen aspirin EC 81 MG tablet Take 81 mg by mouth daily.  . Azelastine HCl 0.15 % SOLN SPRAY 1 SPRAY INTO EACH NOSTRIL EVERY DAY  . buPROPion (WELLBUTRIN XL) 150 MG 24 hr tablet Take 150 mg by mouth 3 (three) times daily.   . citalopram (CELEXA) 20 MG tablet Take 20 mg by mouth daily.    . clonazePAM (KLONOPIN) 1 MG tablet TAKE 1 TABLET BY MOUTH EVERYDAY AT BEDTIME  . hydrochlorothiazide (HYDRODIURIL) 25 MG tablet TAKE 1  TABLET BY MOUTH EVERY DAY  . levothyroxine (SYNTHROID, LEVOTHROID) 75 MCG tablet Take 75 mcg by mouth daily.    . rosuvastatin (CRESTOR) 10 MG tablet TAKE 1 TABLET (10 MG TOTAL) BY MOUTH AT BEDTIME. *REFILL 09/11/16*     Allergies:   Latex   Social History   Socioeconomic History  . Marital status: Married    Spouse name: Not on file  . Number of children: Not on file  . Years of education: Not on file  . Highest education level: Not on file  Occupational History  . Occupation: retired    CommentNurse, mental health: cafeteria, school  Social Needs  . Financial resource strain: Not on file  . Food insecurity     Worry: Not on file    Inability: Not on file  . Transportation needs    Medical: Not on file    Non-medical: Not on file  Tobacco Use  . Smoking status: Former Smoker    Quit date: 07/07/1979    Years since quitting: 40.0  . Smokeless tobacco: Never Used  Substance and Sexual Activity  . Alcohol use: Yes    Comment: occasionally  . Drug use: No  . Sexual activity: Not Currently    Birth control/protection: Post-menopausal  Lifestyle  . Physical activity    Days per week: Not on file    Minutes per session: Not on file  . Stress: Not on file  Relationships  . Social Musicianconnections    Talks on phone: Not on file    Gets together: Not on file    Attends religious service: Not on file    Active member of club or organization: Not on file    Attends meetings of clubs or organizations: Not on file    Relationship status: Not on file  Other Topics Concern  . Not on file  Social History Narrative  . Not on file     Family History: The patient's family history includes Breast cancer (age of onset: 662) in her mother; Coronary artery disease in her father; Hypertension in her mother and sister; Kidney failure in her father; Lymphoma in her mother; Melanoma in her paternal aunt and another family member; Ovarian cancer in her paternal aunt; Throat cancer (age of onset: 3560) in her sister.  ROS:   Please see the history of present illness.    Her husband encourages physical activity but she is not really compliant and this causes friction.  She notes difficulty with word finding.  She feels activity is decreasing over time and that it is her fault for not having enough interest to make herself walk.  All other systems reviewed and are negative.  EKGs/Labs/Other Studies Reviewed:    The following studies were reviewed today: No new data  EKG:  EKG performed on 07/14/2019 demonstrates normal sinus rhythm with nonspecific ST abnormality.  When compared to the tracing performed in 2019,  the ST abnormality is new.  Recent Labs: No results found for requested labs within last 8760 hours.  Recent Lipid Panel    Component Value Date/Time   CHOL 125 10/02/2014 0801   TRIG 55.0 10/02/2014 0801   HDL 59.30 10/02/2014 0801   CHOLHDL 2 10/02/2014 0801   VLDL 11.0 10/02/2014 0801   LDLCALC 55 10/02/2014 0801    Physical Exam:    VS:  BP 138/72   Pulse 67   Ht 5\' 3"  (1.6 m)   Wt 166 lb 12.8 oz (75.7 kg)   SpO2 97%  BMI 29.55 kg/m     Wt Readings from Last 3 Encounters:  07/14/19 166 lb 12.8 oz (75.7 kg)  05/22/19 166 lb (75.3 kg)  07/04/18 164 lb 12.8 oz (74.8 kg)     GEN: Moderate obesity. No acute distress HEENT: Normal NECK: No JVD. LYMPHATICS: No lymphadenopathy CARDIAC:  RRR without murmur, gallop, or edema. VASCULAR:  Normal Pulses. No bruits. RESPIRATORY:  Clear to auscultation without rales, wheezing or rhonchi  ABDOMEN: Soft, non-tender, non-distended, No pulsatile mass, MUSCULOSKELETAL: No deformity  SKIN: Warm and dry NEUROLOGIC:  Alert and oriented x 3 PSYCHIATRIC:  Normal affect   ASSESSMENT:    1. Coronary artery disease of native artery of native heart with stable angina pectoris ([place])   2. Mixed hyperlipidemia   3. Essential hypertension   4. Aortic valve insufficiency, etiology of cardiac valve disease unspecified   5. Educated about COVID-19 virus infection    PLAN:    In order of problems listed above:  1. She is stable without symptoms of angina.  We discussed secondary prevention.  She needs to increase physical activity to achieve at least 30 minutes of moderate activity per week\ 2. The last lipid panel with Dr. Laurann Montana revealed an LDL of 63. 3. Blood pressure is adequately controlled. 4. No significant regurgitation is heard on today's clinical exam. 5. The 3W's is understood and practiced to avoid COVID-19 infection.  Overall education and awareness concerning primary/secondary risk prevention was discussed in detail: LDL  less than 70, hemoglobin A1c less than 7, blood pressure target less than 130/80 mmHg, >150 minutes of moderate aerobic activity per week, avoidance of smoking, weight control (via diet and exercise), and continued surveillance/management of/for obstructive sleep apnea.    Medication Adjustments/Labs and Tests Ordered: Current medicines are reviewed at length with the patient today.  Concerns regarding medicines are outlined above.  No orders of the defined types were placed in this encounter.  No orders of the defined types were placed in this encounter.   There are no Patient Instructions on file for this visit.   Signed, Sinclair Grooms, MD  07/14/2019 2:13 PM    [facility]

## 2019-07-14 ENCOUNTER — Ambulatory Visit: Payer: Medicare Other | Admitting: Interventional Cardiology

## 2019-07-14 ENCOUNTER — Encounter: Payer: Self-pay | Admitting: Interventional Cardiology

## 2019-07-14 ENCOUNTER — Other Ambulatory Visit: Payer: Self-pay

## 2019-07-14 ENCOUNTER — Encounter (INDEPENDENT_AMBULATORY_CARE_PROVIDER_SITE_OTHER): Payer: Self-pay

## 2019-07-14 DIAGNOSIS — I10 Essential (primary) hypertension: Secondary | ICD-10-CM | POA: Diagnosis not present

## 2019-07-14 DIAGNOSIS — I25118 Atherosclerotic heart disease of native coronary artery with other forms of angina pectoris: Secondary | ICD-10-CM

## 2019-07-14 DIAGNOSIS — I351 Nonrheumatic aortic (valve) insufficiency: Secondary | ICD-10-CM

## 2019-07-14 DIAGNOSIS — E782 Mixed hyperlipidemia: Secondary | ICD-10-CM

## 2019-07-14 DIAGNOSIS — Z7189 Other specified counseling: Secondary | ICD-10-CM

## 2019-07-14 NOTE — Patient Instructions (Signed)
Medication Instructions:  Your physician recommends that you continue on your current medications as directed. Please refer to the Current Medication list given to you today.  *If you need a refill on your cardiac medications before your next appointment, please call your pharmacy*  Lab Work: None If you have labs (blood work) drawn today and your tests are completely normal, you will receive your results only by: . MyChart Message (if you have MyChart) OR . A paper copy in the mail If you have any lab test that is abnormal or we need to change your treatment, we will call you to review the results.  Testing/Procedures: None  Follow-Up: At [facility], you and your health needs are our priority.  As part of our continuing mission to provide you with exceptional heart care, we have created designated Provider Care Teams.  These Care Teams include your primary Cardiologist (physician) and Advanced Practice Providers (APPs -  Physician Assistants and Nurse Practitioners) who all work together to provide you with the care you need, when you need it.  Your next appointment:   12 month(s)  The format for your next appointment:   In Person  Provider:   You may see Henry W Smith III, MD or one of the following Advanced Practice Providers on your designated Care Team:    Lori Gerhardt, NP  Laura Ingold, NP  Jill McDaniel, NP   Other Instructions  Your provider recommends that you maintain 150 minutes per week of moderate aerobic activity.   

## 2019-07-29 ENCOUNTER — Other Ambulatory Visit: Payer: Self-pay | Admitting: Physician Assistant

## 2019-09-03 ENCOUNTER — Ambulatory Visit: Payer: Medicare PPO | Attending: Internal Medicine

## 2019-09-03 DIAGNOSIS — Z23 Encounter for immunization: Secondary | ICD-10-CM

## 2019-09-03 NOTE — Progress Notes (Signed)
   Covid-19 Vaccination Clinic  Name:  Beth Christian    MRN: [id number] DOB: [birth year]  09/03/2019  Beth Christian was observed post Covid-19 immunization for 15 minutes without incidence. She was provided with Vaccine Information Sheet and instruction to access the V-Safe system.   Beth Christian was instructed to call 911 with any severe reactions post vaccine: Marland Kitchen Difficulty breathing  . Swelling of your face and throat  . A fast heartbeat  . A bad rash all over your body  . Dizziness and weakness    Immunizations Administered    Name Date Dose VIS Date Route   Pfizer COVID-19 Vaccine 09/03/2019  3:45 AM 0.3 mL 07/25/2019 Intramuscular   Manufacturer: [employer], [employer]   Lot: [id number]   NDC: [address]-3

## 2019-09-22 ENCOUNTER — Ambulatory Visit: Payer: Medicare PPO | Attending: Internal Medicine

## 2019-09-22 DIAGNOSIS — Z23 Encounter for immunization: Secondary | ICD-10-CM

## 2019-09-22 NOTE — Progress Notes (Signed)
   Covid-19 Vaccination Clinic  Name:  LELER BRION    MRN: [id number] DOB: June 01, 1951  09/22/2019  Ms. Risdon was observed post Covid-19 immunization for 15 minutes without incidence. She was provided with Vaccine Information Sheet and instruction to access the V-Safe system.   Ms. Happel was instructed to call 911 with any severe reactions post vaccine: Marland Kitchen Difficulty breathing  . Swelling of your face and throat  . A fast heartbeat  . A bad rash all over your body  . Dizziness and weakness    Immunizations Administered    Name Date Dose VIS Date Route   Pfizer COVID-19 Vaccine 09/22/2019  9:17 AM 0.3 mL 07/25/2019 Intramuscular   Manufacturer: [employer], [employer]   Lot: [id number]   NDC: [address]-6

## 2019-10-16 ENCOUNTER — Other Ambulatory Visit: Payer: Self-pay

## 2019-10-16 ENCOUNTER — Ambulatory Visit (INDEPENDENT_AMBULATORY_CARE_PROVIDER_SITE_OTHER): Payer: Medicare PPO | Admitting: Obstetrics and Gynecology

## 2019-10-16 ENCOUNTER — Encounter: Payer: Self-pay | Admitting: Obstetrics and Gynecology

## 2019-10-16 VITALS — BP 130/70 | Ht 63.0 in | Wt 169.0 lb

## 2019-10-16 DIAGNOSIS — R102 Pelvic and perineal pain: Secondary | ICD-10-CM

## 2019-10-16 DIAGNOSIS — R300 Dysuria: Secondary | ICD-10-CM | POA: Diagnosis not present

## 2019-10-16 LAB — POCT WET PREP WITH KOH
Clue Cells Wet Prep HPF POC: NEGATIVE
KOH Prep POC: NEGATIVE
Trichomonas, UA: NEGATIVE
Yeast Wet Prep HPF POC: NEGATIVE

## 2019-10-16 LAB — POCT URINALYSIS DIPSTICK
Bilirubin, UA: NEGATIVE
Blood, UA: NEGATIVE
Glucose, UA: NEGATIVE
Ketones, UA: NEGATIVE
Leukocytes, UA: NEGATIVE
Nitrite, UA: NEGATIVE
Protein, UA: NEGATIVE
Spec Grav, UA: 1.01 (ref 1.010–1.025)
pH, UA: 6 (ref 5.0–8.0)

## 2019-10-16 NOTE — Patient Instructions (Signed)
I value your feedback and entrusting us with your care. If you get a [facility] patient survey, I would appreciate you taking the time to let us know about your experience today. Thank you!  As of July 24, 2019, your lab results will be released to your MyChart immediately, before I even have a chance to see them. Please give me time to review them and contact you if there are any abnormalities. Thank you for your patience.  

## 2019-10-16 NOTE — Progress Notes (Signed)
Kirby Funk, MD   Chief Complaint  Patient presents with  . Urinary Tract Infection    burning when urinating, no frequency/urine x 1 week    HPI:      [name] is a 69 y.o. [id number] who LMP was No LMP recorded. Patient is postmenopausal., presents today for dysuria for the past wk. Sx occur at end of flow and make her feel like she has to keep urinating. No urgency, frequency, hematuria, pelvic pressure, LBP. No vag sx, no vag d/c, odor.  She is not sex active but her husband digitally stimulated her before sx started and pt feels like she was scratched. Has been treating with vag crm. Sx intermittent and no sx today.    Past Medical History:  Diagnosis Date  . Articular cartilage disorder involving shoulder region, left 12/2016  . Coronary artery disease involving native coronary artery 2012   DES RCA x 2 (most recently for ISR)  . CVA (cerebral infarction) 01/25/2011   during heart cath - no residual deficits  . Depression   . Grinding of teeth   . History of CVA (cerebrovascular accident)   . HTN (hypertension)    states under control with meds., has been on med. x 6 mos.  . Hypothyroidism   . Immature cataract of both eyes   . Osteoarthritis of shoulder    left  . Raynaud's disease   . RLS (restless legs syndrome)   . Rotator cuff tear, left 12/2016  . TMJ dysfunction   . Urinary, incontinence, stress female   . Wears hearing aid in both ears     Past Surgical History:  Procedure Laterality Date  . BREAST BIOPSY Right 09/15/2014   stromal hyalinization and small benign fibroadenoma  . COLONOSCOPY WITH PROPOFOL N/A 12/02/2012   Procedure: COLONOSCOPY WITH PROPOFOL;  Surgeon: Charolett Bumpers, MD;  Location: [facility];  Service: Endoscopy;  Laterality: N/A;  . CORONARY ANGIOPLASTY WITH STENT PLACEMENT  01/25/2011   proximal RCA  . LEFT HEART CATHETERIZATION WITH CORONARY ANGIOGRAM N/A 07/07/2011   Procedure: LEFT HEART CATHETERIZATION WITH  CORONARY ANGIOGRAM;  Surgeon: [name], MD;  Location: [facility] CATH LAB;  Service: Cardiovascular;  Laterality: N/A;  Femoral  . ORIF DISTAL RADIUS FRACTURE Right 06/11/2012  . PERCUTANEOUS CORONARY STENT INTERVENTION (PCI-S) N/A 07/07/2011   Procedure: PERCUTANEOUS CORONARY STENT INTERVENTION (PCI-S);  Surgeon: [name], MD;  Location: [facility] CATH LAB;  Service: Cardiovascular;  Laterality: N/A;  . SHOULDER CLOSED REDUCTION Left 01/11/2017   Procedure: CLOSED MANIPULATION SHOULDER;  Surgeon: Loreta Ave, MD;  Location: [facility];  Service: Orthopedics;  Laterality: Left;  . TENDON REPAIR Left    thumb  . TONSILLECTOMY     as child  . WRIST SURGERY      Family History  Problem Relation Age of Onset  . Hypertension Mother   . Breast cancer Mother 39  . Lymphoma Mother   . Kidney failure Father   . Coronary artery disease Father   . Throat cancer Sister 29  . Hypertension Sister   . Melanoma Other   . Ovarian cancer Paternal Aunt        ? ovar vs cx vs uterine  . Melanoma Paternal Aunt     Social History   Socioeconomic History  . Marital status: Married    Spouse name: Not on file  . Number of children: Not on file  . Years of education:  Not on file  . Highest education level: Not on file  Occupational History  . Occupation: retired    CommentChief Operating Officer, school  Tobacco Use  . Smoking status: Former Smoker    Quit date: 07/07/1979    Years since quitting: 40.3  . Smokeless tobacco: Never Used  Substance and Sexual Activity  . Alcohol use: Yes    Comment: occasionally  . Drug use: No  . Sexual activity: Not Currently    Birth control/protection: Post-menopausal  Other Topics Concern  . Not on file  Social History Narrative  . Not on file   Social Determinants of Health   Financial Resource Strain:   . Difficulty of Paying Living Expenses: Not on file  Food Insecurity:   . Worried About Charity fundraiser in the Last Year: Not on  file  . Ran Out of Food in the Last Year: Not on file  Transportation Needs:   . Lack of Transportation (Medical): Not on file  . Lack of Transportation (Non-Medical): Not on file  Physical Activity:   . Days of Exercise per Week: Not on file  . Minutes of Exercise per Session: Not on file  Stress:   . Feeling of Stress : Not on file  Social Connections:   . Frequency of Communication with Friends and Family: Not on file  . Frequency of Social Gatherings with Friends and Family: Not on file  . Attends Religious Services: Not on file  . Active Member of Clubs or Organizations: Not on file  . Attends Archivist Meetings: Not on file  . Marital Status: Not on file  Intimate Partner Violence:   . Fear of Current or Ex-Partner: Not on file  . Emotionally Abused: Not on file  . Physically Abused: Not on file  . Sexually Abused: Not on file    Outpatient Medications Prior to Visit  Medication Sig Dispense Refill  . amLODipine (NORVASC) 5 MG tablet Take 5 mg by mouth daily.    Marland Kitchen aspirin EC 81 MG tablet Take 81 mg by mouth daily.    . Azelastine HCl 0.15 % SOLN SPRAY 1 SPRAY INTO EACH NOSTRIL EVERY DAY    . buPROPion (WELLBUTRIN XL) 150 MG 24 hr tablet Take 150 mg by mouth 3 (three) times daily.     . citalopram (CELEXA) 20 MG tablet Take 20 mg by mouth daily.      . clonazePAM (KLONOPIN) 1 MG tablet TAKE 1 TABLET BY MOUTH EVERYDAY AT BEDTIME    . hydrochlorothiazide (HYDRODIURIL) 25 MG tablet TAKE 1 TABLET BY MOUTH EVERY DAY 90 tablet 3  . levothyroxine (SYNTHROID, LEVOTHROID) 75 MCG tablet Take 75 mcg by mouth daily.      . rosuvastatin (CRESTOR) 10 MG tablet TAKE 1 TABLET (10 MG TOTAL) BY MOUTH AT BEDTIME. *REFILL 09/11/16* 30 tablet 10   No facility-administered medications prior to visit.      ROS:  Review of Systems  Constitutional: Negative for fever.  Gastrointestinal: Negative for blood in stool, constipation, diarrhea, nausea and vomiting.  Genitourinary:  Positive for dysuria and vaginal pain. Negative for dyspareunia, flank pain, frequency, hematuria, urgency, vaginal bleeding and vaginal discharge.  Musculoskeletal: Negative for back pain.  Skin: Negative for rash.   BREAST: No symptoms   OBJECTIVE:   Vitals:  BP 130/70   Ht 5\' 3"  (1.6 m)   Wt 169 lb (76.7 kg)   BMI 29.94 kg/m   Physical Exam Vitals reviewed.  Constitutional:  Appearance: She is well-developed.  Pulmonary:     Effort: Pulmonary effort is normal.  Genitourinary:    General: Normal vulva.     Pubic Area: No rash.      Labia:        Right: No rash, tenderness or lesion.        Left: No rash, tenderness or lesion.      Vagina: Normal. No vaginal discharge, erythema or tenderness.     Cervix: Normal.     Uterus: Normal. Not enlarged and not tender.      Adnexa: Right adnexa normal and left adnexa normal.       Right: No mass or tenderness.         Left: No mass or tenderness.      Musculoskeletal:        General: Normal range of motion.     Cervical back: Normal range of motion.  Skin:    General: Skin is warm and dry.  Neurological:     General: No focal deficit present.     Mental Status: She is alert and oriented to person, place, and time.  Psychiatric:        Mood and Affect: Mood normal.        Behavior: Behavior normal.        Thought Content: Thought content normal.        Judgment: Judgment normal.     Results: Results for orders placed or performed in visit on 10/16/19 (from the past 24 hour(s))  POCT Wet Prep with KOH     Status: Normal   Collection Time: 10/16/19  2:55 PM  Result Value Ref Range   Trichomonas, UA Negative    Clue Cells Wet Prep HPF POC negn    Epithelial Wet Prep HPF POC     Yeast Wet Prep HPF POC neg    Bacteria Wet Prep HPF POC     RBC Wet Prep HPF POC     WBC Wet Prep HPF POC     KOH Prep POC Negative Negative  POCT Urinalysis Dipstick     Status: Normal   Collection Time: 10/16/19  2:56 PM  Result  Value Ref Range   Color, UA yellow    Clarity, UA clear    Glucose, UA Negative Negative   Bilirubin, UA neg    Ketones, UA neg    Spec Grav, UA 1.010 1.010 - 1.025   Blood, UA neg    pH, UA 6.0 5.0 - 8.0   Protein, UA Negative Negative   Urobilinogen, UA     Nitrite, UA neg    Leukocytes, UA Negative Negative   Appearance     Odor       Assessment/Plan: Dysuria - Plan: POCT Wet Prep with KOH, POCT Urinalysis Dipstick; Net exam, UA, and wet prep. No sx today. Question scratch from sex encounter that burns when urine hits it. Treat with vaseline/coconut oil. See if sx cont to improve. F/u prn.     Return if symptoms worsen or fail to improve.  Stephan Nelis B. Layani Foronda, PA-C 10/16/2019 3:09 PM

## 2019-11-19 ENCOUNTER — Ambulatory Visit: Payer: Medicare PPO | Admitting: Neurology

## 2019-11-25 NOTE — Progress Notes (Signed)
Beth Christian was seen today in neurologic consultation at the request of Kirby Funk, MD.  The consultation is for the evaluation of gait change.   This patient is accompanied in the office by her husband who supplements the history.  Pt states that he has noted balance issues for about a year; however, he states that the first time she fell was 3 years ago she "stumbled" and broke a bone in her wrist.  Then just recently she was moving a box in the living room and fell and broke a bone in her other wrist.  No LE paresthesias.  No DM.  Had a stroke during a cardiac stent in 2012 and ended up with diplopia for 6 months.  None since that time.  Good control over bladder and bowel.    Neuroimaging has previously been performed.  It is available for my review today.  I reviewed this with the patient and her husband.  I also compared it to her 2012 examination.  There is evidence of cerebral white matter disease.  A few do appear to line up perpendicular to the ventricle, but most are scattered throughout.  11/22/17 update: Patient is seen today in follow-up.  This patient is accompanied in the office by her spouse who supplements the history.I have not seen her since 2017.  Dr. Farris Has, her orthopedic physician, referred her for intermittent falls and balance change.  This is the same thing that she was seen for a few years ago.  MRI cervical spine was done in 2017 demonstrating degenerative changes.  MRA of the brain demonstrated widely patent carotids with moderate stenosis at the origin of the left vertebral artery.  She was already on aspirin.  EMG was completed in 2017 as well.  This was normal.  Husband reports that she really has not changed/progressed significantly but had a few falls this past year.  On one occasion fell on ice, despite fact that family told her not to go out on the ice.  Fell on knee.  No fx.  With the other, she got out of the shower she slipped and fell and hit the left shoulder  and fx it.  She states that she thinks that these events are unusual because if she bends over she cannot regain her balance.  No dizziness.  No vertigo.  No hx of balance therapy.  States that she is to start vestibular rehab tomorrow.  Their son in law is a PT there at [employer].  No dizziness.  Some trouble climbing stairs.  Pts husband also c/o pts memory loss.  Able to prepare pill box.  Husband does finances.  Pt drives.  She recently went home from PT appt and thought that she took a short cut but ended up not where she wanted, but was able to find way home.    11/28/19 update: Patient seen today in follow-up.  Daughter present and supplements the history.  I have not seen her in 2 years.  She is again referred back for progressive gait change and cognitive dysfunction.   I saw her in 2017 and 2019 for the same diagnoses.  At that point, no neurologic or neurodegenerative etiology was found.  In 2019, she had neurocognitive testing done that was essentially completely normal, with the exception of mild depression.  I did review notes from the cardiologist from November 30, at which point the patient stated that she was having balance change and falls and felt "depressed and  stressed at times."  No notes accompany the referral today so I did call to get those notes. I did receive notes from her primary care physician and it was noted that patient was having worsening depression with memory loss.  Pt brings list with her which notes that she is "sluggish and watching too much TV and on cell phone too much."  States that she forgets what husband tells her to do.  States that there is stress between her and her husband because she has gained weight and he wants her "thin and trim."  She states that "I think its more him than me."  Daughter agrees that pts husbands delivery to pt is not good in this regard and creates struggles.  However, daughter notes memory issues as well.  Doesn't think that pt takes meds  correctly even though pill box is laid out for her.  Pt disagrees.  She does have some balance issues - the example the daughter gives is that she was outside looking up for the international space station and fell down.  When she gets up in the middle of the night, she will run into her furniture.    ALLERGIES:   Allergies  Allergen Reactions  . Latex Itching and Rash    CURRENT MEDICATIONS:  Outpatient Encounter Medications as of 11/28/2019  Medication Sig  . amLODipine (NORVASC) 5 MG tablet Take 5 mg by mouth daily.  Marland Kitchen aspirin EC 81 MG tablet Take 81 mg by mouth daily.  . Azelastine HCl 0.15 % SOLN SPRAY 1 SPRAY INTO EACH NOSTRIL EVERY DAY  . buPROPion (WELLBUTRIN XL) 150 MG 24 hr tablet Take 150 mg by mouth 3 (three) times daily.   . citalopram (CELEXA) 20 MG tablet Take 20 mg by mouth daily.    . clonazePAM (KLONOPIN) 1 MG tablet TAKE 1 TABLET BY MOUTH EVERYDAY AT BEDTIME  . hydrochlorothiazide (HYDRODIURIL) 25 MG tablet TAKE 1 TABLET BY MOUTH EVERY DAY  . levothyroxine (SYNTHROID, LEVOTHROID) 75 MCG tablet Take 75 mcg by mouth daily.    . rosuvastatin (CRESTOR) 10 MG tablet TAKE 1 TABLET (10 MG TOTAL) BY MOUTH AT BEDTIME. *REFILL 09/11/16*   No facility-administered encounter medications on file as of 11/28/2019.    PHYSICAL EXAMINATION:    VITALS:   Vitals:   11/28/19 0949  BP: 131/76  Pulse: 70  SpO2: 97%  Weight: 162 lb (73.5 kg)  Height: 5\' 3"  (1.6 m)    GEN:  Normal appears female in no acute distress.  Appears stated age. HEENT:  Normocephalic, atraumatic. The mucous membranes are moist. The superficial temporal arteries are without ropiness or tenderness. Cardiovascular: Regular rate and rhythm. Lungs: Clear to auscultation bilaterally. Neck/Heme: There are no carotid bruits noted bilaterally.  NEUROLOGICAL: Orientation:  The patient is alert and oriented x 3.  Fund of knowledge is appropriate.  Recent and remote memory intact.  Attention span and concentration  normal.  Repeats and names without difficulty. Cranial nerves: There is good facial symmetry. Extraocular muscles are intact and visual fields are full to confrontational testing. Speech is fluent and clear. Soft palate rises symmetrically and there is no tongue deviation. Hearing is intact to conversational tone. Tone: Tone is good throughout. Sensation: Sensation is intact to light touch. Vibration is intact at the bilateral big toe. There is no extinction with double simultaneous stimulation. There is no sensory dermatomal level identified. Coordination:  The patient has no difficulty with RAM's or FNF bilaterally. Motor: Strength is 5/5 in  the bilateral upper and lower extremities.  Shoulder shrug is equal and symmetric. There is no pronator drift.  There are no fasciculations noted. DTR's: Deep tendon reflexes are 3+/4 at the bilateral biceps, triceps, brachioradialis, patella and achilles (same as previous).  Plantar responses are downgoing bilaterally. Gait and Station: The patient is able to ambulate without difficulty.  She actually ambulates in a tandem fashion today, which is better than 2 and 4 years ago.  She sways in the Romberg position with eyes closed. Labs are reviewed from primary care.  Labs were last done on September 15, 2019.  Ferritin was 69.9.  TSH was 1.56.  Sodium was 141, potassium 3.8, chloride 100, CO2 32, BUN 19, creatinine 0.98, glucose 89, AST 19, ALT 6, white blood cell 7.3, hemoglobin 14.1, hematocrit 41.4, platelets 289.  IMPRESSION/PLAN  1. Gait instability  -neuro exam is stable from few years ago.  Neuro exam nonfocal and non lateralizing.  Don't see neuro etiology for instability.  I do think that q hs instability could be from the klonopin, which dose has been increased since our last visit.  Would recommend tapering if possible  -will repeat her MRI brain.  Last done in 2017 2.  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  She admits to  depression but is very resistant to psychiatric care.  Told her I really think that she needs to reconsider that.  Names given for psychiatrist that are recommended, but they can also research on their own.  -needs to wear hearing aids which may be issue  -agreeable to repeat neurocog testing.  We will get that scheduled. 3.  F/u will depend on results of above.    Total time spent on today's visit was 45 minutes, including both face-to-face time and nonface-to-face time.  Time included that spent on review of records (prior notes available to me/labs/imaging if pertinent), discussing treatment and goals, answering patient's questions and coordinating care.     Cc:  Lavone Orn, MD

## 2019-11-28 ENCOUNTER — Encounter: Payer: Self-pay | Admitting: Neurology

## 2019-11-28 ENCOUNTER — Ambulatory Visit: Payer: Medicare PPO | Admitting: Neurology

## 2019-11-28 ENCOUNTER — Other Ambulatory Visit: Payer: Self-pay

## 2019-11-28 VITALS — BP 131/76 | HR 70 | Ht 63.0 in | Wt 162.0 lb

## 2019-11-28 DIAGNOSIS — R413 Other amnesia: Secondary | ICD-10-CM

## 2019-11-28 DIAGNOSIS — R2681 Unsteadiness on feet: Secondary | ICD-10-CM

## 2019-11-28 DIAGNOSIS — F331 Major depressive disorder, recurrent, moderate: Secondary | ICD-10-CM | POA: Diagnosis not present

## 2019-11-28 NOTE — Patient Instructions (Addendum)
You have been referred for a neurocognitive evaluation in our office.   The evaluation has two parts.   . The first part of the evaluation is a clinical interview with the neuropsychologist (Dr. Milbert Coulter or Dr. Roseanne Reno). Please bring someone with you to this appointment if possible, as it is helpful for the doctor to hear from both you and another adult who knows you well.   . The second part of the evaluation is testing with the doctor's technician Annabelle Harman or Selena Batten). The testing includes a variety of tasks- mostly question-and-answer, some paper-and-pencil. There is nothing you need to do to prepare for this appointment, but having a good night's sleep prior to the testing, taking medications as you normally would, and bringing eyeglasses and hearing aids (if you wear them), is advised. Please make sure that you wear a mask to the appointment.  Please note: We have to reserve several hours of the neuropsychologist's time and the psychometrician's time for your evaluation appointment. As such, please note that there is a No-Show fee of $100. If you are unable to attend any of your appointments, please contact our office as soon as possible to reschedule.   We have sent a referral to [facility] for your MRI and they will call you directly to schedule your appointment. They are located at [address] [facility]. If you need to contact them directly please call [phone].

## 2019-12-17 ENCOUNTER — Encounter: Payer: Self-pay | Admitting: Counselor

## 2019-12-17 ENCOUNTER — Ambulatory Visit: Payer: Medicare PPO

## 2019-12-17 ENCOUNTER — Other Ambulatory Visit: Payer: Self-pay

## 2019-12-17 ENCOUNTER — Ambulatory Visit (INDEPENDENT_AMBULATORY_CARE_PROVIDER_SITE_OTHER): Payer: Medicare PPO | Admitting: Counselor

## 2019-12-17 DIAGNOSIS — F329 Major depressive disorder, single episode, unspecified: Secondary | ICD-10-CM

## 2019-12-17 DIAGNOSIS — F32A Depression, unspecified: Secondary | ICD-10-CM

## 2019-12-17 DIAGNOSIS — Z630 Problems in relationship with spouse or partner: Secondary | ICD-10-CM

## 2019-12-17 DIAGNOSIS — G3184 Mild cognitive impairment, so stated: Secondary | ICD-10-CM

## 2019-12-17 DIAGNOSIS — R413 Other amnesia: Secondary | ICD-10-CM

## 2019-12-17 NOTE — Progress Notes (Signed)
   Psychometrist Note   Cognitive testing was administered to Beth Christian by Lamar Benes, B.S. (Technician) under the supervision of Alphonzo Severance, Psy.D., ABN. [name] was able to tolerate all test procedures. Dr. Nicole Kindred met with the patient as needed to manage any emotional reactions to the testing procedures (if applicable). Rest breaks were offered.    The battery of tests administered was selected by Dr. Nicole Kindred with consideration to the patient's current level of functioning, the nature of her symptoms, emotional and behavioral responses during the interview, level of literacy, observed level of motivation/effort, and the nature of the referral question. This battery was communicated to the psychometrist. Communication between Dr. Nicole Kindred and the psychometrist was ongoing throughout the evaluation and Dr. Nicole Kindred was immediately accessible at all times. Dr. Nicole Kindred provided supervision to the technician on the date of this service, to the extent necessary to assure the quality of all services provided.    Beth Christian will return in approximately one week for an interactive feedback session with Dr. Nicole Kindred, at which time female test performance, clinical impressions, and treatment recommendations will be reviewed in detail. The patient understands she can contact our office should she require our assistance before this time.   A total of 115 minutes of billable time were spent with Beth Christian by the technician, including test administration and scoring time. Billing for these services is reflected in Dr. Les Pou note.   This note reflects time spent with the psychometrician and does not include test scores, clinical history, or any interpretations made by Dr. Nicole Kindred. The full report will follow in a separate note.

## 2019-12-17 NOTE — Progress Notes (Signed)
NEUROPSYCHOLOGICAL EVALUATION [facility]  Patient Name: Beth Christian MRN: [id number] Date of Birth: 1951-01-13 Age: 69 y.o. Education: 12 years  Referral Circumstances and Background Information  [name] is a 69 y.o., right-hand dominant, married woman with a history of depression/anxiety, some psychosocial distress with her husband, remote stroke during cardiac stenting in 2012 resulting in diplopia, memory problems since 2018 and balance problems/falls with a non-focal neurological exam. She has followed intermittently with Dr. Arbutus Leas since 2017, whose impression was primarily pseudodementia and stable neurological exam. She had previous neuropsychological testing with Dr. Dimas Chyle in 2019, which showed no findings compelling for cognitive impairment, including mild cognitive impairment.   On interview, the patient and her husband reported that her memory problems did start around 2018 and they think they have gotten worse, particularly over the past 9 months. I have the feeling their report is colored by their level of discord with one another, which is significant. In terms of day-to-day symptoms, she is repeating things, she has a harder time cooking than in the past, misplacing things, forgetting her debit card PIN, and her husband feels like he has to stay on the housework more because she gets distracted. The patient herself notices her memory problems and is concerned. With respect to mood, the patient says she is ok, although her husband described her as quite apathetic and lacking in motivation and she appears quite depressed. Her activity level is low and she is spending "6 days a week on the couch in front of the TV or on her phone." The patient sleeps 7 hours a night "with a little green pill." She is on clonazepam for restless legs syndrome. She got diagnosed with OSA and does use a CPAP. They denied any dream enactment behavior.   With respect to functioning, the  patient used to reconcile their checkbook and her husband has had to be more involved in that recently because he has noticed some errors. She hasn't had any major issues with driving, she has gotten "turned around" a couple of times but finds her way eventually. There are some changes in her functioning around the house, such as with cleaning because she gets distracted. She does fairly well with orientation and isn't losing the month or the year. He reminds her about her appointments although she does still track them, her husband feels like he needs to remind her more than in the past. She forgets to take her medications sometimes, her wellbutrin dose was changed because she was forgetting the middle one.    Past Medical History and Review of Relevant Studies   Patient Active Problem List   Diagnosis Date Noted  . Dyspnea on exertion 05/15/2018  . Cerebral microvascular disease 06/30/2016  . Essential hypertension 06/30/2016  . Hyperlipidemia 04/23/2014  . Cerebral infarction (HCC) 02/12/2014  . CAD (coronary artery disease) 07/04/2011  . Depression 07/04/2011  . Hypothyroidism 07/04/2011  . Urinary, incontinence, stress female 07/04/2011  . Acrocyanosis (HCC) 07/04/2011  . Tinnitus 07/04/2011  . DJD (degenerative joint disease) 07/04/2011   Review of Neuroimaging and Relevant Medical History: :  The patient has an MRI of the brain ordered but has not gotten it yet. It is scheduled 12/30/2019. She has a previous study from 2017 that shows some volume loss, and I wonder on some cuts if there isn't a bit more near the head of the hippocampi bilaterally although it is not clearly pathological. There is a mile burden of leukoaraiosis in the  periventricular and subcortical cerebral white matter bilaterally (L > R around occipital horn).   Neuropsychological test findings from 2019 reviewed, revealing essentially normal overall performance albeit with low word reading suggesting learning  difficulties and somewhat weak word-list recall that could be related.   Current Outpatient Medications  Medication Sig Dispense Refill  . amLODipine (NORVASC) 5 MG tablet Take 5 mg by mouth daily.    Marland Kitchen aspirin EC 81 MG tablet Take 81 mg by mouth daily.    . Azelastine HCl 0.15 % SOLN SPRAY 1 SPRAY INTO EACH NOSTRIL EVERY DAY    . buPROPion (WELLBUTRIN XL) 150 MG 24 hr tablet Take 150 mg by mouth 3 (three) times daily.     . citalopram (CELEXA) 20 MG tablet Take 20 mg by mouth daily.      . clonazePAM (KLONOPIN) 1 MG tablet TAKE 1 TABLET BY MOUTH EVERYDAY AT BEDTIME    . hydrochlorothiazide (HYDRODIURIL) 25 MG tablet TAKE 1 TABLET BY MOUTH EVERY DAY 90 tablet 3  . levothyroxine (SYNTHROID, LEVOTHROID) 75 MCG tablet Take 75 mcg by mouth daily.      . rosuvastatin (CRESTOR) 10 MG tablet TAKE 1 TABLET (10 MG TOTAL) BY MOUTH AT BEDTIME. *REFILL 09/11/16* 30 tablet 10   No current facility-administered medications for this visit.   Family History  Problem Relation Age of Onset  . Hypertension Mother   . Breast cancer Mother 50  . Lymphoma Mother   . Kidney failure Father   . Coronary artery disease Father   . Throat cancer Sister 14  . Hypertension Sister   . Melanoma Other   . Ovarian cancer Paternal Aunt        ? ovar vs cx vs uterine  . Melanoma Paternal Aunt    The patient thinks that her mother has dementia, although it hasn't been medically diagnosed and she still lives independently. It does sound like she has had some concerning symptoms, she was saying there were people in the house and now a daughter stays with her at night.There is no  significant family history of psychiatric illness.  Psychosocial History  Developmental, Educational and Employment History: The patient stated that she got mainly C's and B's and was an "average student," although she did have problems with spelling and reading (learning disability suspected on eval with Dr. Alinda Dooms). 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 She worked at [employer] for many years. 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   Psychiatric History: The patient stated that she has never been in counseling, she said she was interested but her husband didn't want her to, although he was vague about why that was. She has been on Celexa and Wellbutrin for many years. She isn't sure if they help or not, she thinks they did initially. She has never seen a psychiatrist.   Substance Use History: The patient doesn't drink or use any drugs; she used to smoke but quit in the 1980s.   Relationship History and Living Cimcumstances: The patient and her husband have been married for 44 years. It sounds like it is not the happiest of marriages, when I asked the patient said "He does his things I do mine." She then later told the technician that there is significant discord and her husband has stated he's "not  sure how much more he can take." It sounds like her husband criticizes her weight as per the chart and he yells at her and resents her passivity.   Mental Status and Behavioral Observations  Sensorium/Arousal: The patient's level of arousal was awake and alert. Hearing and vision were adequate for testing purposes. Orientation: The patient was oriented fully to person, place, time, and situation.  Appearance: Dressed in appropriate, casual clothing with reasonable grooming and hygiene.  Behavior: The patient was appropriate. She greatly minimized her affective concerns and relational distress with her husband during the interview but then later reported some of those details to the technician.  Speech/language: Speech was normal in rate, rhythm, volume, and prosody.  Gait/Posture: Gait was observed as within normal limits and was normal at her last neurological  exam.  Movement: No overt signs/symptoms of movement disorder noted.  Social Comportment: The patient was appropriate if not a bit passive, often looking to her husband to answer questions.  Mood: "Ok" Affect: Dysphoric  Thought process/content: Thought process was logical, linear, and goal-directed. She was able to supply much of her own history, although she did defer to her husband a great deal. Thought content was appropriate to the topics discussed.  Safety: No thoughts of harming self or others at this encounter.  Insight: Fair.   MMSE - Mini Mental State Exam 12/17/2019 11/22/2017  Not completed: - Unable to complete  Orientation to time 5 -  Orientation to Place 5 -  Registration 3 -  Attention/ Calculation 5 -  Recall 2 -  Language- name 2 objects 2 -  Language- repeat 1 -  Language- follow 3 step command 3 -  Language- read & follow direction 1 -  Write a sentence 1 -  Copy design 0 -  Total score 28 -  MMSE Serial 7's: 24/30  Test Procedures  Wide Range Achievement Test - 4             Word Reading Neuropsychological Assessment Battery  List Learning  Story Learning  Daily Living Memory  Naming  Digit Span Repeatable Battery for the Assessment of Neuropsychological Status (Form A)  Figure Copy  Judgment of Line Orientation  Coding  Figure Recall The Dot Counting Test A Random Letter Test Controlled Oral Word Association (F-A-S) Semantic Fluency (Animals) Trail Making Test A & B Complex Ideational Material Modified [place] Card Sorting Test Geriatric Depression Scale - Short Form Quick Dementia Rating System (completed by husband, Claiborne Billings)  Plan  MAHLANI BERNINGER was seen for a psychiatric diagnostic evaluation and neuropsychological testing. She initially minimized her affective and relational issues but eventually admitted that there is significant conflict between her and her husband. She does have a somewhat concerning history of day-to-day memory problems  and apathy, although she also presented as quite depressed and scored at a high level on the GDS-SF. Testing will be helpful to further elucidate and cognitive difficulties. Full and complete note with impressions, recommendations, and interpretation of test data to follow.   Viviano Simas Nicole Kindred, PsyD, [place] Clinical Neuropsychologist  Informed Consent and Coding/Compliance  Risks and benefits of the evaluation were discussed with the patient prior to all testing procedures. I conducted a clinical interview and neuropsychological testing (at least two tests) with Corlis Hove and Lamar Benes, B.S. (Technician) assisted me in administering additional test procedures. The patient was able to tolerate the testing procedures and the patient (and/or family if applicable) is likely to benefit from further follow up  to receive the diagnosis and treatment recommendations, which will be rendered at the next encounter. Billing below reflects technician time, my direct face-to-face time with the patient, time spent in test administration, and time spent in professional activities including but not limited to: neuropsychological test interpretation, integration of neuropsychological test data with clinical history, report preparation, treatment planning, care coordination, and review of diagnostically pertinent medical history or studies.   Services associated with this encounter: Clinical Interview [phone]) plus 60 minutes (15830; Neuropsychological Evaluation by Professional)  120 minutes (94076; Neuropsychological Evaluation by Professional, Adl.) 30 minutes (80881; Test Administration by Professional) 30 minutes (10315; Neuropsychological Testing by Technician) 85 minutes (94585; Neuropsychological Testing by Technician, Adl.)

## 2019-12-18 NOTE — Progress Notes (Signed)
[facility]  Patient Name: TREVIA NOP MRN: [id number] Date of Birth: 1951/02/13 Age: 68 y.o. Education: 12 years  Measurement properties of test scores: IQ, Index, and Standard Scores (SS): Mean = 100; Standard Deviation = 15 Scaled Scores (Ss): Mean = 10; Standard Deviation = 3 Z scores (Z): Mean = 0; Standard Deviation = 1 T scores (T); Mean = 50; Standard Deviation = 10  TEST SCORES:    Note: This summary of test scores accompanies the interpretive report and should not be considered in isolation without reference to the appropriate sections in the text. Test scores are relative to age, gender, and educational history as available and appropriate.   Performance Validity        "A" Random Letter Test Raw  Descriptor      Errors 1 Within Expectation  The Dot Counting Test: 11 Within Expectation      Mental Status Screening     Total Score Descriptor  MMSE "World" 28 MCI  MMSE Serial 7's 24 MCI      Expected Functioning        Wide Range Achievement Test: Standard/Scaled Score Percentile      Word Reading 78 7      Attention/Processing Speed        Neuropsychological Assessment Battery (Attention Module, Form 1): T-score Percentile      Digits Forward 45 31      Digits Backwards 46 34      Repeatable Battery for the Assessment of Neuropsychological Status (Form A): Scaled Score Percentile      Coding 4 2      Language        Neuropsychological Assessment Battery (Language Module, Form 1): T-score Percentile      Naming   (30) 57 75      Verbal Fluency: T-score Percentile      Controlled Oral Word Association (F-A-S) 25 1      Semantic Fluency (Animals) 27 1      Memory:        Neuropsychological Assessment Battery (Memory Module, Form 1): T-score Percentile      List Learning           List A Immediate Recall   (3, 4, 5) 30 2         List B Immediate Recall   (4) 50 50         List A Short Delayed Recall   (0) 21  <1         List A Long Delayed Recall   (0) 28 2         List A Long Delayed Yes/No Recognition Hits   (10) --- 31         List A Long Delayed Yes/No Recognition False Alarms   (10) --- 2         List A Recognition Discriminability Index --- 2     Story Learning           Immediate Recall   (24, 29) 43 25         Delayed Recall   (25) 40 16      Daily Living Memory            Immediate Recall   (16, 15) 37 9          Delayed Recall   (8, 3) 41 18          Recognition Hits    (  7) --- 8      Repeatable Battery for the Assessment of Neuropsychological Status (Form A): Scaled Score Percentile         Figure Recall   (3) 3 1      Visuospatial/Constructional Functioning        Repeatable Battery for the Assessment of Neuropsychological Status (Form A): Standard/Scaled Score Percentile     Visuospatial/Constructional Index 89 23         Figure Copy   (18) 10 50         Judgment of Line Orientation   (13) --- 10-16      Executive Functioning        Modified [place] Card Sorting Test (MWCST): Standard/T-Score Percentile      Number of Categories Correct 39 14      Number of Perseverative Errors 39 14      Number of Total Errors 43 25      Percent Perseverative Errors 42 22  Executive Function Composite 82 12      Trail Making Test: T-Score Percentile      Part A 40 16      Part B 47 38      [place] Diagnostic Aphasia Exam: Raw Score Scaled Score      Complex Ideational Material 12 12      Clock Drawing Raw Score Descriptor      Command 10 WNL      Rating Scales         Raw Score Descriptor  Quick Dementia Rating System        Sum of Boxes 3.5 Very Mild Dementia      Total Score 6 Mild Dementia  Geriatric Depression Scale - Short Form 12 Depressed    Peter V. Roseanne Reno PsyD, ABN Clinical Neuropsychologist

## 2019-12-22 NOTE — Progress Notes (Signed)
NEUROPSYCHOLOGICAL EVALUATION [facility]  Patient Name: Beth Christian MRN: [id number] Date of Birth: 1951-01-03 Age: 69 y.o. Education: 12 years  Clinical Impressions  Beth Christian is a 69 y.o., right-hand dominant, married woman with a history of memory problems since 2018, depression/anxiety, balance problems, falls with a nonfocal exam, and some marital discord. She underwent neuropsychological testing in 2019 with Dr. Dimas Chyle, which showed no findings compelling for cognitive impairment but she did have marginally low delayed word list recall and low word reading suggestive of a learning disorder. Within the present evaluation, her memory performance was abnormal with low scores on word list learning, delayed figure recall, and a general trend towards poorer performance on other memory indicators. There is also a significant decline with respect to verbal fluency, particularly animal fluency, and timed number-symbol coding. Nevertheless she does not have a clear memory storage problem and is remembering some information across time. She generally performed well on measures of executive function, visuospatial function, and attention. She screened positive for the presence of depression and her husband characterizes her as functioning at a dementia level but I think that is an overestimate and that some of her "functional" difficulties are behavioral.   Beth Christian is thus demonstrating a mild level of cognitive decline, mostly showing on memory measures. In this patient's case, it is not clear to me whether this is simply a function of medication side effects and depression or bona-fide neurological dysfunction related to an underlying disorder. She should return for repeating evaluation in 1 to 2 years and I am recommending couples counseling for her and her husband.   Diagnostic Impressions: Amnestic mild cognitive impairment Depressive Disorder Partner Relational Problem    Recommendations to be discussed with patient  Your performance and presentation on assessment suggested some decline in your memory test performance and test performance in other areas. The extent of the decline is mild, at this point, and I think the best diagnosis is Mild Cognitive Impairment.   The major difference between mild cognitive impairment (MCI) and dementia is in severity and potential prognosis. Once someone reaches a level of severity adequate to be diagnosed with a dementia, there is usually progression over time, though this may be years. On the other hand, mild cognitive impairment, while a significant risk for dementia in future, does not always progress to dementia, and in some instances stays the same or can even revert to normal. It is important to realize that if MCI is due to underlying Alzheimer's disease, it will most likely progress to dementia eventually. The rate of conversion to Alzheimer's dementia from amnestic MCI is about 15% per year versus the general population risk of conversion of 2% per year.   In your case, it is unclear whether your mild cognitive impairment is just related to depression and medication side effects or underlying illness. It is probably best to conceptualize your mild cognitive impairment as a risk factor for further decline and to make behavioral and lifestyle changes in order to minimize the chance of that happening. If your MCI is due to an underlying condition, it will worsen over time.   Your MRI will be helpful to see if there has been any shrinkage in areas of the brain responsible for memory. Please get that study.   There is a significant research base and evidence of effectiveness for something called "behavioral activation," which is a fancy way of saying that you should increase your activity level. In general, people do  not feel as happy or do as well when they are not doing much. This can include things like getting out for walks,  re-engaging in hobbies, spending time with family or friends, or learning a new hobby. It's not so important what you do as that you enjoy it and stick with it. Depression can start a vicious cycle where you are not doing a lot because you don't feel well, which leads to less things to be excited and happy about, and thus more depression and behavioral avoidance. It can be hard to change this pattern once it has started but most people find that they feel better when they start doing more even if they don't enjoy it at first.   Exercise is the closest thing medicine has to a panacea. Exercise probably has the biggest effect size of any intervention in terms of enhancing cognitive function and preventing dementia. My general recommendation is to get 30 minutes of exercise a day, or, if you cannot, to get as close to that as possible. If you are doing low intensity exercise such as walking that does not elevate your heart rate then you would want to get around 60 minutes a day.   There is now good quality evidence from at least one large scale study that a modified mediterranean diet may help slow cognitive decline. This is known as the "MIND" diet. The Mind diet is not so much a specific diet as it is a set of recommendations for things that you should and should not eat.   Foods that are ENCOURAGED on the MIND Diet:  Green, leafy vegetables: Aim for six or more servings per week. This includes kale, spinach, cooked greens and salads.  All other vegetables: Try to eat another vegetable in addition to the green leafy vegetables at least once a day. It is best to choose non-starchy vegetables because they have a lot of nutrients with a low number of calories.  Berries: Eat berries at least twice a week. There is a plethora of research on strawberries, and other berries such as blueberries, raspberries and blackberries have also been found to have antioxidant and brain health benefits.  Nuts: Try to get five  servings of nuts or more each week. The creators of the MIND diet don't specify what kind of nuts to consume, but it is probably best to vary the type of nuts you eat to obtain a variety of nutrients. Peanuts are a legume and do not fall into this category.  Olive oil: Use olive oil as your main cooking oil. There may be other heart-healthy alternatives such as algae oil, though there is not yet sufficient research upon which to base a formal recommendation.  Whole grains: Aim for at least three servings daily. Choose minimally processed grains like oatmeal, quinoa, brown rice, whole-wheat pasta and 100% whole-wheat bread.  Fish: Eat fish at least once a week. It is best to choose fatty fish like salmon, sardines, trout, tuna and mackerel for their high amounts of omega-3 fatty acids.  Beans: Include beans in at least four meals every week. This includes all beans, lentils and soybeans.  Poultry: Try to eat chicken or [place] at least twice a week. Note that fried chicken is not encouraged on the MIND diet.  Wine: Aim for no more than one glass of alcohol daily. Both red and white wine may benefit the brain. However, much research has focused on the red wine compound resveratrol, which may help protect against  Alzheimer's disease.  Foods that are DISCOURAGED on the MIND Diet: Butter and margarine: Try to eat less than 1 tablespoon (about 14 grams) daily. Instead, try using olive oil as your primary cooking fat, and dipping your bread in olive oil with herbs.  Cheese: The MIND diet recommends limiting your cheese consumption to less than once per week.  Red meat: Aim for no more than three servings each week. This includes all beef, pork, lamb and products made from these meats.  Maceo Pro food: The MIND diet highly discourages fried food, especially the kind from fast-food restaurants. Limit your consumption to less than once per week.  Pastries and sweets: This includes most of the processed junk food and  desserts you can think of. Ice cream, cookies, brownies, snack cakes, donuts, candy and more. Try to limit these to no more than four times a week.  You should return for re-evaluation in one to two years as clinically indicated so that we can follow your progress over time.   Test Findings  Test scores are summarized in additional documentation associated with this encounter. Test scores are relative to age, gender, and educational history as available and appropriate. There were no concerns about performance validity as all findings fell within normal expectations.   General Intellectual Functioning/Achievement:  Performance on single word reading was unusually low, equivalent to a 5.2 grade level, which tempers expectations for her cognitive test performance to some extent. In this case, the patient already has previous neuropsychological testing to use as a level of comparison.   Attention and Processing Efficiency: Performance on measures of attention and working memory was reasonable, falling at an average level on digit repetition forward and backward, which is similar to her previous performance.   Timed number-symbol coding, a measure of processing efficiency and selective attention, was weak and fell at an extremely low level, which is significantly lower than her previous performance.   Language: Performance on language measures showed normal range visual object confrontation naming. On timed fluency indicators, she scored in the extremely low range, which reflects a significant decline relative to her previous performance, particularly with respect to animal fluency.   Visuospatial Function: Visuospatial and constructional findings revealed low average capacities, with average performance copying a modestly complex figure and low average performance on judgment of angular line orientations.   Learning and Memory: Measures of learning and memory are concerning for some possible  decline and the number of low subtest scores is unusual (I.e., less than 10% of healthy individuals would be expected to have this number of low scores). The profile is not convincing for frank storage problems at this time but may reflect a developing storage problem. Her performance presented as weaker than at the previous assessment with the caveat that there were some differences in instrumentation.   In the verbal realm, immediate recall for a 12-item word list was 3, 4, and 5 words across three learning trials, which is unusually low followed by no words at short or long delay intervals. She did not recall any of the words on long-delayed recall and also did not benefit from recognition cuing. She did better recalling a short story with low average immediate and delayed recall. Memory for brief daily-living type information was low average on immediate and delayed recall with unusually low recognition discrimination.   In the visual realm, delayed recall for a modestly complex figure was extremely low, whereas she scored at an average level on this test previously.  Executive Functions: Executive function indicators generated reasonable, normal range scores for the most part. The Executive Function Composite of the Modified [employer] was low average. Alternating sequencing of numbers and letters of the alphabet was low average. Reasoning with verbal information was average on the Complex Ideational Material. Clock drawing was within normal limits with good face, number positioning, and hand placement. She did have a low score on generation of words in response to given letters, which was extremely low.   Rating Scale(s): Ms. Null screened positive for the presence of depression. Although this instrument is a dichotomous outcome test (I.e., positive or negative) I would also note that the total level of symptoms she reported is high. She was characterized as functioning at a  dementia level by her husband, which is likely an overestimate of her actual severity, and I think some of her functional decline is psychiatric/neuropsychiatric/behavioral in nature and thus more consistent with MCI.   Bettye Boeck Roseanne Reno PsyD, ABN Clinical Neuropsychologist

## 2019-12-24 ENCOUNTER — Other Ambulatory Visit: Payer: Self-pay

## 2019-12-24 ENCOUNTER — Ambulatory Visit (INDEPENDENT_AMBULATORY_CARE_PROVIDER_SITE_OTHER): Payer: Medicare PPO | Admitting: Counselor

## 2019-12-24 ENCOUNTER — Encounter: Payer: Self-pay | Admitting: Counselor

## 2019-12-24 DIAGNOSIS — Z630 Problems in relationship with spouse or partner: Secondary | ICD-10-CM | POA: Diagnosis not present

## 2019-12-24 DIAGNOSIS — F32A Depression, unspecified: Secondary | ICD-10-CM

## 2019-12-24 DIAGNOSIS — F329 Major depressive disorder, single episode, unspecified: Secondary | ICD-10-CM

## 2019-12-24 DIAGNOSIS — G3184 Mild cognitive impairment, so stated: Secondary | ICD-10-CM

## 2019-12-24 NOTE — Progress Notes (Signed)
[facility]  I met with Beth Christian to review the findings resulting from Beth neuropsychological evaluation. Since the last appointment, she is not taking Beth medication as prescribed (she will forget to take night time medication then doesn't want to get up to get them), which I admonished. There continues to be significant relational conflict between Beth Christian and Beth Christian. He gets angry, she gets passive, and we spent some time discussing that dynamic. I provided my strong recommendation that they pursue couples counseling. The patient would "rather just sit around and not talk to each other than deal with all that stuff," which is certainly Beth decision. We reviewed Beth test data, including impression of less than expected memory performance albeit with some remaining question as to whether this represents true MCI due to an underlying condition or other factors. I suggested that she view Beth MCI as a risk factor and act accordingly to minimize the chance of any further decline in the future. Interventions provided during this encounter included psychoeducation, and some limited behavioral analysis of their maladaptive patterns with one another. I took time to explain the findings and answer all the patient's questions. I encouraged Beth Christian to contact me should she have any further questions or if further follow up is desired.   Current Medications and Medical History   Current Outpatient Medications  Medication Sig Dispense Refill  . amLODipine (NORVASC) 5 MG tablet Take 5 mg by mouth daily.    Marland Kitchen aspirin EC 81 MG tablet Take 81 mg by mouth daily.    . Azelastine HCl 0.15 % SOLN SPRAY 1 SPRAY INTO EACH NOSTRIL EVERY DAY    . buPROPion (WELLBUTRIN XL) 150 MG 24 hr tablet Take 150 mg by mouth 3 (three) times daily.     . citalopram (CELEXA) 20 MG tablet Take 20 mg by mouth daily.      . clonazePAM (KLONOPIN) 1 MG tablet TAKE 1 TABLET BY MOUTH  EVERYDAY AT BEDTIME    . hydrochlorothiazide (HYDRODIURIL) 25 MG tablet TAKE 1 TABLET BY MOUTH EVERY DAY 90 tablet 3  . levothyroxine (SYNTHROID, LEVOTHROID) 75 MCG tablet Take 75 mcg by mouth daily.      . rosuvastatin (CRESTOR) 10 MG tablet TAKE 1 TABLET (10 MG TOTAL) BY MOUTH AT BEDTIME. *REFILL 09/11/16* 30 tablet 10   No current facility-administered medications for this visit.    Patient Active Problem List   Diagnosis Date Noted  . Dyspnea on exertion 05/15/2018  . Cerebral microvascular disease 06/30/2016  . Essential hypertension 06/30/2016  . Hyperlipidemia 04/23/2014  . Cerebral infarction ([place]) 02/12/2014  . CAD (coronary artery disease) 07/04/2011  . Depression 07/04/2011  . Hypothyroidism 07/04/2011  . Urinary, incontinence, stress female 07/04/2011  . Acrocyanosis ([place]) 07/04/2011  . Tinnitus 07/04/2011  . DJD (degenerative joint disease) 07/04/2011    Mental Status and Behavioral Observations  Beth Christian was available on time for this scheduled appointment and was alert (orientation not formally assessed). Beth self-reported mood was "fine," although she presented similarly to the previous encounter with significant passivity and likely dysphoria based on vocal quality. Beth thought process was difficult to assess, because she mainly let Beth Christian talk, although she did not seem to have any difficulties following the topics discussed. Beth thought content was appropriate and focused on the topics reviewed. There were no safety concerns identified at today's encounter.   Plan  Feedback provided regarding the patient's neuropsychological evaluation. She was not willing  to accept a referral for couple's counseling, which is unfortunate given that a sound relationship is all the more important if Beth difficulties are due to a progressive condition. The diagnostic picture is not entirely clear but she does have MCI. We reviewed dietary and lifestyle changes to prevent/delay  cognitive decline. She will get an MRI, and they will follow up with Dr. Carles Collet. Beth Christian was encouraged to contact me if any questions arise or if further follow up is desired.   Viviano Simas Nicole Kindred, PsyD, ABN Clinical Neuropsychologist  Service(s) Provided at This Encounter: 50 minutes [phone]; Conjoint therapy with patient present)

## 2019-12-24 NOTE — Patient Instructions (Signed)
Your performance and presentation on assessment suggested some decline in your memory test performance and test performance in other areas. The extent of the decline is mild, at this point, and I think the best diagnosis is Mild Cognitive Impairment.   The major difference between mild cognitive impairment (MCI) and dementia is in severity and potential prognosis. Once someone reaches a level of severity adequate to be diagnosed with a dementia, there is usually progression over time, though this may be years. On the other hand, mild cognitive impairment, while a significant risk for dementia in future, does not always progress to dementia, and in some instances stays the same or can even revert to normal.It is important to realize that if MCI is due to underlying Alzheimer's disease, it will most likely progress to dementia eventually. The rate of conversion to Alzheimer's dementiafrom amnestic MCI is about 15% per year versus the general population risk of conversion of 2% per year.  In your case, it is unclear whether your mild cognitive impairment is just related to depression and medication side effects or underlying illness. It is probably best to conceptualize your mild cognitive impairment as a risk factor for further decline and to make behavioral and lifestyle changes in order to minimize the chance of that happening. If your MCI is due to an underlying condition, it will worsen over time.   Your MRI will be helpful to see if there has been any shrinkage in areas of the brain responsible for memory. Please get that study.   There is a significant research base and evidence of effectiveness for something called "behavioral activation," which is a fancy way of saying that you should increase your activity level. In general, people do not feel as happy or do as well when they are not doing much. This can include things like getting out for walks, re-engaging in hobbies, spending time with family or  friends, or learning a new hobby. It's not so important what you do as that you enjoy it and stick with it. Depression can start a vicious cycle where you are not doing a lot because you don't feel well, which leads to less things to be excited and happy about, and thus more depression and behavioral avoidance. It can be hard to change this pattern once it has started but most people find that they feel better when they start doing more even if they don't enjoy it at first.   Exercise is the closest thing medicine has to a panacea. Exercise probably has the biggest effect size of any intervention in terms of enhancing cognitive function and preventing dementia. My general recommendation is to get 30 minutes of exercise a day, or, if you cannot, to get as close to that as possible. If you are doing low intensity exercise such as walking that does not elevate your heart rate then you would want to get around 60 minutes a day.   There is now good quality evidence from at least one large scale study that a modified mediterranean diet may help slow cognitive decline. This is known as the "MIND" diet. The Mind diet is not so much a specific diet as it is a set of recommendations for things that you should and should not eat.   Foods that are ENCOURAGED on the MIND Diet:  Green, leafy vegetables: Aim for six or more servings per week. This includes kale, spinach, cooked greens and salads.  All other vegetables: Try to eat another vegetable in  addition to the green leafy vegetables at least once a day. It is best to choose non-starchy vegetables because they have a lot of nutrients with a low number of calories.  Berries: Eat berries at least twice a week. There is a plethora of research on strawberries, and other berries such as blueberries, raspberries and blackberries have also been found to have antioxidant and brain health benefits.  Nuts: Try to get five servings of nuts or more each week. The creators of  the MIND diet don't specify what kind of nuts to consume, but it is probably best to vary the type of nuts you eat to obtain a variety of nutrients. Peanuts are a legume and do not fall into this category.  Olive oil: Use olive oil as your main cooking oil. There may be other heart-healthy alternatives such as algae oil, though there is not yet sufficient research upon which to base a formal recommendation.  Whole grains: Aim for at least three servings daily. Choose minimally processed grains like oatmeal, quinoa, brown rice, whole-wheat pasta and 100% whole-wheat bread.  Fish: Eat fish at least once a week. It is best to choose fatty fish like salmon, sardines, trout, tuna and mackerel for their high amounts of omega-3 fatty acids.  Beans: Include beans in at least four meals every week. This includes all beans, lentils and soybeans.  Poultry: Try to eat chicken or [place] at least twice a week. Note that fried chicken is not encouraged on the MIND diet.  Wine: Aim for no more than one glass of alcohol daily. Both red and white wine may benefit the brain. However, much research has focused on the red wine compound resveratrol, which may help protect against Alzheimer's disease.  Foods that are DISCOURAGED on the MIND Diet: Butter and margarine: Try to eat less than 1 tablespoon (about 14 grams) daily. Instead, try using olive oil as your primary cooking fat, and dipping your bread in olive oil with herbs.  Cheese: The MIND diet recommends limiting your cheese consumption to less than once per week.  Red meat: Aim for no more than three servings each week. This includes all beef, pork, lamb and products made from these meats.  [place] food: The MIND diet highly discourages fried food, especially the kind from fast-food restaurants. Limit your consumption to less than once per week.  Pastries and sweets: This includes most of the processed junk food and desserts you can think of. Ice cream, cookies,  brownies, snack cakes, donuts, candy and more. Try to limit these to no more than four times a week.  You should return for re-evaluation in one to two years as clinically indicated so that we can follow your progress over time.   We discussed a referral for couples counseling, which I strongly recommended. You did not wish to pursue that at this time.

## 2019-12-30 ENCOUNTER — Ambulatory Visit
Admission: RE | Admit: 2019-12-30 | Discharge: 2019-12-30 | Disposition: A | Discharge status: Home / Self Care | Payer: Medicare PPO | Source: Ambulatory Visit | Attending: Neurology | Admitting: Neurology

## 2019-12-30 ENCOUNTER — Other Ambulatory Visit: Payer: Self-pay

## 2019-12-30 DIAGNOSIS — R413 Other amnesia: Secondary | ICD-10-CM

## 2019-12-30 DIAGNOSIS — R270 Ataxia, unspecified: Secondary | ICD-10-CM | POA: Diagnosis not present

## 2020-01-02 NOTE — Progress Notes (Signed)
Assessment/Plan:   1.  Memory loss  -Patient with neurocognitive testing that just demonstrated mild cognitive impairment.  See Dr. Marca Ancona records for details.  We can consider repeating in 1 to 2 years, but treatment of depression needs to be completed as well as couples counseling.  I will leave these things to the patient's other physicians.  At this point, there is no evidence of a neurodegenerative process like dementia.  She declines referral to psychiatry today.    2.  MRI of the brain scan  -Radiology suggested consideration for NPH.  Patient's clinical picture does not fit NPH (nothing parkinsonian, no dementia per neurocog testing and incontinence is a stress incontinence).  In addition, her MRI has remained stable for several years.  Patient has some enlarged ventricles, but I do not think that those are out of proportion to degree of atrophy, especially considering the size of the sylvian fissures.  I did tell her that we could consider a lumbar puncture, but I am not sure what we would test, as she has a very normal gait, including the ability to ambulate in a tandem fashion.  Pt absolutely and definitely declined the lumbar puncture.  3.  Follow up as needed   Subjective:   Beth Christian was seen today in follow up for memory change and MRI results.  Pt unaccompanied today.  My previous records as well as any outside records available were reviewed prior to todays visit.  Patient had neurocognitive testing since our last visit with Dr. Roseanne Reno.  This demonstrated mild cognitive impairment.  No evidence of dementia.  There was some cognitive decline, albeit mild, and it was unclear of whether this was a functional medication side effect, depression or something else.  It was recommended that consideration be made to repeat evaluation in 1 to 2 years, as well as couples counseling was recommended for patient/husband.  MRI of the brain was completed and personally reviewed.   Radiologist suggested that there was prominence of the ventricles and suggested that we should consider NPH, but also stated that it had not changed compared to her prior examination.  There was also noted moderate atrophy.  Pt states today that her family doesn't want her to come back here and she doesn't either.  "my daughters are giving me memory pills and my husband thinks that its all because I am just fat."   CURRENT MEDICATIONS:  Outpatient Encounter Medications as of 01/05/2020  Medication Sig  . amLODipine (NORVASC) 5 MG tablet Take 5 mg by mouth daily.  Marland Kitchen aspirin EC 81 MG tablet Take 81 mg by mouth daily.  Marland Kitchen buPROPion (WELLBUTRIN XL) 150 MG 24 hr tablet Take 150 mg by mouth 3 (three) times daily.   . citalopram (CELEXA) 20 MG tablet Take 20 mg by mouth daily.    . clonazePAM (KLONOPIN) 1 MG tablet TAKE 1 TABLET BY MOUTH EVERYDAY AT BEDTIME  . fluticasone (FLONASE) 50 MCG/ACT nasal spray Place 1 spray into both nostrils daily.  . hydrochlorothiazide (HYDRODIURIL) 25 MG tablet TAKE 1 TABLET BY MOUTH EVERY DAY  . levothyroxine (SYNTHROID, LEVOTHROID) 75 MCG tablet Take 75 mcg by mouth daily.    . rosuvastatin (CRESTOR) 10 MG tablet TAKE 1 TABLET (10 MG TOTAL) BY MOUTH AT BEDTIME. *REFILL 09/11/16*  . [DISCONTINUED] Azelastine HCl 0.15 % SOLN SPRAY 1 SPRAY INTO EACH NOSTRIL EVERY DAY   No facility-administered encounter medications on file as of 01/05/2020.     Objective:   PHYSICAL  EXAMINATION:    VITALS:   Vitals:   01/05/20 0809  BP: 118/68  Pulse: 68  SpO2: 98%  Weight: 161 lb (73 kg)  Height: 5\' 3"  (1.6 m)    GEN:  The patient appears stated age and is in NAD. HEENT:  Normocephalic, atraumatic.  The mucous membranes are moist. The superficial temporal arteries are without ropiness or tenderness. CV:  RRR Lungs:  CTAB Neck/HEME:  There are no carotid bruits bilaterally.  Neurological examination:  Orientation: The patient is alert and oriented x3. Cranial nerves:  There is good facial symmetry.The speech is fluent and clear. Soft palate rises symmetrically and there is no tongue deviation. Hearing is intact to conversational tone. Sensation: Sensation is intact to light touch throughout Motor: Strength is at least antigravity x4.  Movement examination: Tone: There is normal tone in the UE/LE Abnormal movements:  no tremor.  No myoclonus.  No asterixis.   Coordination:  There is no decremation with RAM's, with any form of RAMS, including alternating supination and pronation of the forearm, hand opening and closing, finger taps, heel taps and toe taps. Gait and Station: The patient has no difficulty arising out of a deep-seated chair without the use of the hands. The patient's stride length is good.  She ambulates well in a tandem fashion    Total time spent on today's visit was 25 minutes, including both face-to-face time and nonface-to-face time.  Time included that spent on review of records (prior notes available to me/labs/imaging if pertinent), discussing treatment and goals, answering patient's questions and coordinating care.  Cc:  Lavone Orn, MD

## 2020-01-05 ENCOUNTER — Encounter: Payer: Self-pay | Admitting: Neurology

## 2020-01-05 ENCOUNTER — Other Ambulatory Visit: Payer: Self-pay

## 2020-01-05 ENCOUNTER — Ambulatory Visit: Payer: Medicare PPO | Admitting: Neurology

## 2020-01-05 VITALS — BP 118/68 | HR 68 | Ht 63.0 in | Wt 161.0 lb

## 2020-01-05 DIAGNOSIS — G3184 Mild cognitive impairment, so stated: Secondary | ICD-10-CM

## 2020-01-05 DIAGNOSIS — F32A Depression, unspecified: Secondary | ICD-10-CM

## 2020-01-05 DIAGNOSIS — F329 Major depressive disorder, single episode, unspecified: Secondary | ICD-10-CM

## 2020-01-05 NOTE — Patient Instructions (Signed)
1.  Your cognitive testing did not show dementia.  It did show that there was memory change and that depression contributed to that.  I do recommend psychiatrist and you can let me know if you change your mind on referral to that. 2.  We discussed in detail your MRI brain.  It had not changed since your prior one.  We discussed the fluid filled spaces and the possibility of spinal tap, which you decided to hold on for now.    The physicians and staff at [facility] are committed to providing excellent care. You may receive a survey requesting feedback about your experience at our office. We strive to receive "very good" responses to the survey questions. If you feel that your experience would prevent you from giving the office a "very good " response, please contact our office to try to remedy the situation. We may be reached at [phone]. Thank you for taking the time out of your busy day to complete the survey.

## 2020-01-16 DIAGNOSIS — F325 Major depressive disorder, single episode, in full remission: Secondary | ICD-10-CM | POA: Diagnosis not present

## 2020-01-19 DIAGNOSIS — R0781 Pleurodynia: Secondary | ICD-10-CM | POA: Diagnosis not present

## 2020-02-25 DIAGNOSIS — H2513 Age-related nuclear cataract, bilateral: Secondary | ICD-10-CM | POA: Diagnosis not present

## 2020-03-15 DIAGNOSIS — R251 Tremor, unspecified: Secondary | ICD-10-CM | POA: Diagnosis not present

## 2020-03-15 DIAGNOSIS — I10 Essential (primary) hypertension: Secondary | ICD-10-CM | POA: Diagnosis not present

## 2020-03-15 DIAGNOSIS — F3341 Major depressive disorder, recurrent, in partial remission: Secondary | ICD-10-CM | POA: Diagnosis not present

## 2020-03-15 DIAGNOSIS — G2581 Restless legs syndrome: Secondary | ICD-10-CM | POA: Diagnosis not present

## 2020-04-16 DIAGNOSIS — H6122 Impacted cerumen, left ear: Secondary | ICD-10-CM | POA: Diagnosis not present

## 2020-05-19 ENCOUNTER — Ambulatory Visit: Payer: Medicare PPO | Admitting: Neurology

## 2020-05-19 ENCOUNTER — Encounter: Payer: Self-pay | Admitting: Neurology

## 2020-05-19 VITALS — BP 122/78 | HR 68 | Ht 63.0 in | Wt 153.0 lb

## 2020-05-19 DIAGNOSIS — Z9181 History of falling: Secondary | ICD-10-CM

## 2020-05-19 DIAGNOSIS — F3289 Other specified depressive episodes: Secondary | ICD-10-CM

## 2020-05-19 DIAGNOSIS — R2689 Other abnormalities of gait and mobility: Secondary | ICD-10-CM | POA: Diagnosis not present

## 2020-05-19 DIAGNOSIS — G3184 Mild cognitive impairment, so stated: Secondary | ICD-10-CM

## 2020-05-19 DIAGNOSIS — R251 Tremor, unspecified: Secondary | ICD-10-CM

## 2020-05-19 DIAGNOSIS — Z23 Encounter for immunization: Secondary | ICD-10-CM | POA: Diagnosis not present

## 2020-05-19 NOTE — Patient Instructions (Addendum)
I believe you have a multifactorial gait disorder, meaning, that it is Beth Christian is due to a combination of factors. These factors include: Having fallen previously, deconditioning, arthritis, lack of confidence when walking.  I do believe he will benefit from physical therapy, I will make a referral to [facility] as per your request.    Please consider using your walker or cane at all times for gait safety.  I do not see any signs of Parkinson's-like changes thankfully.  Please remember to stand up slowly and get your bearings first turn slowly, no bending down to pick anything, no heavy lifting, be extra careful at night and first thing in the morning. Also, be careful in the Bathroom and the kitchen.   Remember to drink plenty of fluid, eat healthy meals and do not skip any meals. Try to eat protein with a every meal and eat a healthy snack such as fruit or nuts or yogurt in between meals. Try to keep a regular sleep-wake schedule and try to exercise daily, particularly in the form of walking, 20-30 minutes a day, if you can.   Please consider counseling as it has been recommended by Dr. Valentina Lucks as well as Dr. Arbutus Leas and Dr. Roseanne Reno before.  You may also benefit from referral to psychiatry.  Please talk to Dr. Valentina Lucks about referrals.  Please avoid drinking alcohol daily, even if you do not drink more than 1 glass.  Alcohol can affect your sleep and your balance.  Please talk to Dr. Valentina Lucks about reducing your clonazepam as it does affect your balance sometimes.  Please try to hydrate better with water, 6 to 8 cups of water per day are generally recommended, 8 ounce size each.  As far as your medications are concerned, I would like to suggest no new medications.   We can continue to monitor your symptoms and examination.  You had recent work-up through Dr. Arbutus Leas and Dr. Roseanne Reno at [facility].

## 2020-05-19 NOTE — Progress Notes (Signed)
Subjective:    Patient ID: Beth Christian is a 69 y.o. female.  HPI     Beth Foley, MD, PhD [facility] [address], [address] [address] [place], [place] [address]  Dear Dr. Valentina Lucks,  I saw your patient, Beth Christian, upon your kind request any new neurologic clinic today for initial consultation of her gait disorder, for evaluation and second opinion.  As you know, [name] is a 69 year old right-handed woman with an underlying complex medical history of coronary artery disease with status post stenting in 2012, embolic stroke in 2012, degenerative joint disease, hypothyroidism, tendinitis, depression, osteopenia, restless leg syndrome, hypertension, hearing loss, sleep apnea, and overweight state, who reports difficulty with her gait and balance.  Her history is primarily provided by her husband, especially in the beginning as she did not answer right away and he started talking and offering most of the information.  He is primarily worried about her lack of motivation, lack of initiative, unsteady gait, memory loss, falls.  He reports that she does not exercise, she has been forgetful.  She often does not change from her pajamas and sits and watches her cell phone.  I reviewed your office note from 03/15/2020. She was advised to consider counseling.  She declined a psychiatry referral at the time. She was worried about her mother. She had been seeing Dr. Arbutus Leas in neurology and had work-up in the form of brain MRI as well as neuropsychological testing. I reviewed Dr. Marca Ancona report from 12/17/19: << Diagnostic Impressions: Amnestic mild cognitive impairment Depressive Disorder Partner Relational Problem >>   She was found to have probable mild cognitive impairment with superimposed depression.  She had declined a psychiatry referral.  I reviewed her brain MRI from 12/30/2019, without contrast: IMPRESSION: 1. No acute intracranial abnormality. 2. Mild chronic small  vessel ischemia and moderate parenchymal volume loss. 3. Prominence of the supratentorial ventricles with crowding of the high convexity sulci which may be seen in the setting of normal pressure hydrocephalus in the appropriate clinical scenario. This has not significantly changed from prior.    She was not felt to have normal pressure hydrocephalus by Dr. Arbutus Leas.  She was advised to follow-up as needed.  A repeat neuropsychological evaluation was considered in 1 to 2 years.  She endorses not exercising, she does not hydrate very much with water, they estimate that she drinks about 3 to 4 cups of water per day.  She has had physical therapy on more than one occasion.  She was told to use a walker and cane but does not use either.  She has no telltale family history of dementia but mom is 20 years old and has some memory loss.  She has not pursued counseling or an appointment with psychiatry.  They have not pursued marriage counseling.  Her Past Medical History Is Significant For: Past Medical History:  Diagnosis Date  . Articular cartilage disorder involving shoulder region, left 12/2016  . Coronary artery disease involving native coronary artery 2012   DES RCA x 2 (most recently for ISR)  . CVA (cerebral infarction) 01/25/2011   during heart cath - no residual deficits  . Depression   . Grinding of teeth   . History of CVA (cerebrovascular accident)   . HTN (hypertension)    states under control with meds., has been on med. x 6 mos.  . Hypothyroidism   . Immature cataract of both eyes   . Osteoarthritis of shoulder  left  . Raynaud's disease   . RLS (restless legs syndrome)   . Rotator cuff tear, left 12/2016  . TMJ dysfunction   . Urinary, incontinence, stress female   . Wears hearing aid in both ears     Her Past Surgical History Is Significant For: Past Surgical History:  Procedure Laterality Date  . BREAST BIOPSY Right 09/15/2014   stromal hyalinization and small benign  fibroadenoma  . COLONOSCOPY WITH PROPOFOL N/A 12/02/2012   Procedure: COLONOSCOPY WITH PROPOFOL;  Surgeon: Charolett BumpersMartin K Johnson, MD;  Location: [facility];  Service: Endoscopy;  Laterality: N/A;  . CORONARY ANGIOPLASTY WITH STENT PLACEMENT  01/25/2011   proximal RCA  . LEFT HEART CATHETERIZATION WITH CORONARY ANGIOGRAM N/A 07/07/2011   Procedure: LEFT HEART CATHETERIZATION WITH CORONARY ANGIOGRAM;  Surgeon: Lesleigh NoeHenry W Smith III, MD;  [facility];  Service: Cardiovascular;  Laterality: N/A;  Femoral  . ORIF DISTAL RADIUS FRACTURE Right 06/11/2012  . PERCUTANEOUS CORONARY STENT INTERVENTION (PCI-S) N/A 07/07/2011   Procedure: PERCUTANEOUS CORONARY STENT INTERVENTION (PCI-S);  Surgeon: Lesleigh NoeHenry W Smith III, MD;  [facility];  Service: Cardiovascular;  Laterality: N/A;  . SHOULDER CLOSED REDUCTION Left 01/11/2017   Procedure: CLOSED MANIPULATION SHOULDER;  Surgeon: Loreta AveMurphy, Daniel F, MD;  Location:  SURGERY CENTER;  Service: Orthopedics;  Laterality: Left;  . TENDON REPAIR Left    thumb  . TONSILLECTOMY     as child  . WRIST SURGERY      Her Family History Is Significant For: Family History  Problem Relation Age of Onset  . Hypertension Mother   . Breast cancer Mother [id number]  . Lymphoma Mother   . Kidney failure Father   . Coronary artery disease Father   . Throat cancer Sister [id number]  . Hypertension Sister   . Melanoma Other   . Ovarian cancer Paternal Aunt        ? ovar vs cx vs uterine  . Melanoma Paternal Aunt     Her Social History Is Significant For: Social History   Socioeconomic History  . Marital status: Married    Spouse name: Not on file  . Number of children: Not on file  . Years of education: Not on file  . Highest education level: Not on file  Occupational History  . Occupation: retired    CommentNurse, mental health: cafeteria, school  Tobacco Use  . Smoking status: Former Smoker    Quit date: 07/07/1979    Years since quitting: 40.8  . Smokeless tobacco: Never  Used  Vaping Use  . Vaping Use: Never used  Substance and Sexual Activity  . Alcohol use: Yes    Comment: occasionally  . Drug use: No  . Sexual activity: Not Currently    Birth control/protection: Post-menopausal  Other Topics Concern  . Not on file  Social History Narrative  . Not on file   Social Determinants of Health   Financial Resource Strain:   . Difficulty of Paying Living Expenses: Not on file  Food Insecurity:   . Worried About Programme researcher, broadcasting/film/videounning Out of Food in the Last Year: Not on file  . Ran Out of Food in the Last Year: Not on file  Transportation Needs:   . Lack of Transportation (Medical): Not on file  . Lack of Transportation (Non-Medical): Not on file  Physical Activity:   . Days of Exercise per Week: Not on file  . Minutes of Exercise per Session: Not on file  Stress:   . Feeling of Stress :  Not on file  Social Connections:   . Frequency of Communication with Friends and Family: Not on file  . Frequency of Social Gatherings with Friends and Family: Not on file  . Attends Religious Services: Not on file  . Active Member of Clubs or Organizations: Not on file  . Attends Banker Meetings: Not on file  . Marital Status: Not on file    Her Allergies Are:  Allergies  Allergen Reactions  . Latex Itching and Rash  :   Her Current Medications Are:  Outpatient Encounter Medications as of 05/19/2020  Medication Sig  . Acetaminophen (TYLENOL PO) Take by mouth.  Marland Kitchen amLODipine (NORVASC) 5 MG tablet Take 5 mg by mouth daily.  Marland Kitchen aspirin EC 81 MG tablet Take 81 mg by mouth daily.  Marland Kitchen azelastine (ASTELIN) 0.1 % nasal spray Place into both nostrils 2 (two) times daily. Use in each nostril as directed  . buPROPion (WELLBUTRIN XL) 150 MG 24 hr tablet Take 150 mg by mouth 3 (three) times daily.   . citalopram (CELEXA) 20 MG tablet Take 20 mg by mouth daily.    . clonazePAM (KLONOPIN) 1 MG tablet TAKE 1 TABLET BY MOUTH EVERYDAY AT BEDTIME  . hydrochlorothiazide  (HYDRODIURIL) 25 MG tablet TAKE 1 TABLET BY MOUTH EVERY DAY  . levothyroxine (SYNTHROID, LEVOTHROID) 75 MCG tablet Take 75 mcg by mouth daily.    . rosuvastatin (CRESTOR) 10 MG tablet TAKE 1 TABLET (10 MG TOTAL) BY MOUTH AT BEDTIME. *REFILL 09/11/16*  . UNABLE TO FIND Med Name: artificial tears  . VITAMIN D PO Take 5,000 Units by mouth.  . [DISCONTINUED] fluticasone (FLONASE) 50 MCG/ACT nasal spray Place 1 spray into both nostrils daily. (Patient not taking: Reported on 05/19/2020)   No facility-administered encounter medications on file as of 05/19/2020.  :   Review of Systems:  Out of a complete 14 point review of systems, all are reviewed and negative with the exception of these symptoms as listed below:  Review of Systems  Neurological:       Here to discuss worsening gait. Pt has fallen several times in the last few months. Reports last fall was 3 weeks ago and hit the right side of her head- fall resulted in a black eye. Husband also reports decline in memory - has seen Dr. Arbutus Leas in the past for this issue.     Objective:  Neurological Exam  Physical Exam Physical Examination:   Vitals:   05/19/20 1009  BP: 122/78  Pulse: 68  SpO2: 95%    General Examination: The patient is a very pleasant 69 y.o. female in no acute distress. She appears well-developed and well-nourished and well groomed.   HEENT: Normocephalic, atraumatic, pupils are equal, round and reactive to light and accommodation.  Face is symmetric, no obvious facial masking is noted, no nuchal rigidity, no lip, neck or jaw tremor.  Extraocular tracking is well preserved, hearing is grossly intact, no carotid bruits.  Airway examination reveals moderate mouth dryness, tongue protrudes centrally and palate elevates symmetrically.   Chest: Clear to auscultation without wheezing, rhonchi or crackles noted.  Heart: S1+S2+0, regular and normal without murmurs, rubs or gallops noted.   Abdomen: Soft, non-tender and  non-distended with normal bowel sounds appreciated on auscultation.  Extremities: There is no pitting edema in the distal lower extremities bilaterally. Pedal pulses are intact.  Skin: Warm and dry without trophic changes noted.  Musculoskeletal: exam reveals no obvious joint deformities, tenderness or joint swelling  or erythema.   Neurologically:  Mental status: The patient is awake, alert and oriented, but she is minimally verbal, history is primarily provided by her husband.  She is soft-spoken, no dysarthria. Thought process is linear. Mood is constricted and affect is blunted.  Cranial nerves II - XII are as described above under HEENT exam. In addition: shoulder shrug is normal with equal shoulder height noted. Motor exam: Normal bulk, strength and tone is noted. There is no drift, no resting tremor.  She has a slight intermittent postural tremor in the right more than left upper extremity, slight action tremor, no intention tremor. Romberg is negative. Reflexes are 1+ throughout. Babinski: Toes are flexor bilaterally. Fine motor skills and coordination: intact with normal finger taps, normal hand movements, normal rapid alternating patting, normal foot taps and normal foot agility.  Cerebellar testing: No dysmetria or intention tremor on finger to nose testing. Heel to shin is unremarkable bilaterally. There is no truncal or gait ataxia.  Sensory exam: intact to light touch in the upper and lower extremities.  Gait, station and balance: She stands easily. No veering to one side is noted. No leaning to one side is noted. Posture is age-appropriate and stance is narrow based.  She walks slowly and cautiously, she holds her hands close to her body, no natural arm swing, no magnetic gait, no obvious shuffling but slow and cautious gait is noted.  Assessment and Plan:   In summary, DEONDRA LABRADOR is a very pleasant 69 y.o.-year old female with an underlying complex medical history of coronary  artery disease with status post stenting in 2012, embolic stroke in 2012, degenerative joint disease, hypothyroidism, tendinitis, depression, osteopenia, restless leg syndrome, hypertension, hearing loss, sleep apnea, and overweight state, who presents for evaluation of her gait disorder and tremors, also reports recent falls, cognitive decline, in the context of mood disorder.  She had recent evaluation through [facility] neurology, had a brain MRI and also neuropsychological evaluation.  Suboptimally treated depression can be a contributor to her lack of motivation and initiative and cognitive symptoms.  She has been advised to seek counseling and also perhaps evaluation through psychiatry.  She is advised that it could be a helpful tool to improve her initiative and motivation.  She has had physical therapy for gait and balance training.  I offered a referral to physical therapy and they would like to go to [facility] for this.  I do not see any signs of parkinsonism.  She has been deemed to have mild cognitive impairment, we can continue to monitor her memory.  She has had good work-up so far and I did not suggest any new testing from my end of things at this point.  I did ask her to increase her water intake and be cautious with daily alcohol consumption.  She reports that she drinks 1 glass of wine every evening.  She is advised to try to exercise on a regular basis.  She does not have any obvious weakness but may have a component of deconditioning also.  I did not suggest any new medications at this time but did caution her regarding the use of clonazepam.  She has been on it for years for restless leg syndrome as I understand.  She is advised that clonazepam can affect her balance.  She is advised to talk to you about this.  She is advised to follow-up for recheck in our office in 6 months, sooner if needed.  I answered  all the questions today and the patient and her husband were in agreement. Thank you  very much for allowing me to participate in the care of this nice patient. If I can be of any further assistance to you please do not hesitate to call me at [phone].  Sincerely,   Beth Foley, MD, PhD

## 2020-05-26 DIAGNOSIS — H938X2 Other specified disorders of left ear: Secondary | ICD-10-CM | POA: Diagnosis not present

## 2020-05-26 DIAGNOSIS — R269 Unspecified abnormalities of gait and mobility: Secondary | ICD-10-CM | POA: Diagnosis not present

## 2020-05-26 DIAGNOSIS — M6281 Muscle weakness (generalized): Secondary | ICD-10-CM | POA: Diagnosis not present

## 2020-05-31 DIAGNOSIS — R042 Hemoptysis: Secondary | ICD-10-CM | POA: Diagnosis not present

## 2020-05-31 DIAGNOSIS — J019 Acute sinusitis, unspecified: Secondary | ICD-10-CM | POA: Diagnosis not present

## 2020-06-02 DIAGNOSIS — R269 Unspecified abnormalities of gait and mobility: Secondary | ICD-10-CM | POA: Diagnosis not present

## 2020-06-02 DIAGNOSIS — M6281 Muscle weakness (generalized): Secondary | ICD-10-CM | POA: Diagnosis not present

## 2020-06-09 DIAGNOSIS — R269 Unspecified abnormalities of gait and mobility: Secondary | ICD-10-CM | POA: Diagnosis not present

## 2020-06-09 DIAGNOSIS — M6281 Muscle weakness (generalized): Secondary | ICD-10-CM | POA: Diagnosis not present

## 2020-06-16 DIAGNOSIS — R269 Unspecified abnormalities of gait and mobility: Secondary | ICD-10-CM | POA: Diagnosis not present

## 2020-06-16 DIAGNOSIS — M6281 Muscle weakness (generalized): Secondary | ICD-10-CM | POA: Diagnosis not present

## 2020-06-23 DIAGNOSIS — M6281 Muscle weakness (generalized): Secondary | ICD-10-CM | POA: Diagnosis not present

## 2020-06-23 DIAGNOSIS — R269 Unspecified abnormalities of gait and mobility: Secondary | ICD-10-CM | POA: Diagnosis not present

## 2020-06-30 DIAGNOSIS — M6281 Muscle weakness (generalized): Secondary | ICD-10-CM | POA: Diagnosis not present

## 2020-06-30 DIAGNOSIS — R269 Unspecified abnormalities of gait and mobility: Secondary | ICD-10-CM | POA: Diagnosis not present

## 2020-08-06 DIAGNOSIS — S51811A Laceration without foreign body of right forearm, initial encounter: Secondary | ICD-10-CM | POA: Diagnosis not present

## 2020-08-14 NOTE — Progress Notes (Signed)
Cardiology Office Note:    Date:  08/18/2020   ID:  Beth Christian, DOB [birth year], MRN [id number]  PCP:  Kirby Funk, MD  Cardiologist:  [name], MD   Referring MD: Kirby Funk, MD   Chief Complaint  Patient presents with  . Coronary Artery Disease  . Hypertension    History of Present Illness:    Beth Christian is a 70 y.o. female with a hx CAD with RCA DES 2012, procedure related embolic stroke, hypertension, decreased memory, depression, and hyperlipidemia.  She denies angina.  She has not had tachycardia or arrhythmia/palpitation.  She is having difficulty with memory.  Having difficulty with balance.  She has not had syncope.  Past Medical History:  Diagnosis Date  . Articular cartilage disorder involving shoulder region, left 12/2016  . Coronary artery disease involving native coronary artery 2012   DES RCA x 2 (most recently for ISR)  . CVA (cerebral infarction) 01/25/2011   during heart cath - no residual deficits  . Depression   . Grinding of teeth   . History of CVA (cerebrovascular accident)   . HTN (hypertension)    states under control with meds., has been on med. x 6 mos.  . Hypothyroidism   . Immature cataract of both eyes   . Osteoarthritis of shoulder    left  . Raynaud's disease   . RLS (restless legs syndrome)   . Rotator cuff tear, left 12/2016  . TMJ dysfunction   . Urinary, incontinence, stress female   . Wears hearing aid in both ears     Past Surgical History:  Procedure Laterality Date  . BREAST BIOPSY Right 09/15/2014   stromal hyalinization and small benign fibroadenoma  . COLONOSCOPY WITH PROPOFOL N/A 12/02/2012   Procedure: COLONOSCOPY WITH PROPOFOL;  Surgeon: Charolett Bumpers, MD;  Location: [facility];  Service: Endoscopy;  Laterality: N/A;  . CORONARY ANGIOPLASTY WITH STENT PLACEMENT  01/25/2011   proximal RCA  . LEFT HEART CATHETERIZATION WITH CORONARY ANGIOGRAM N/A 07/07/2011   Procedure: LEFT HEART  CATHETERIZATION WITH CORONARY ANGIOGRAM;  Surgeon: [name], MD;  Location: [facility] C[facility] LAB;  Service: Cardiovascular;  Laterality: N/A;  Femoral  . ORIF DISTAL RADIUS FRACTURE Right 06/11/2012  . PERCUTANEOUS CORONARY STENT INTERVENTION (PCI-S) N/A 07/07/2011   Procedure: PERCUTANEOUS CORONARY STENT INTERVENTION (PCI-S);  Surgeon: [name], MD;  Location: [facility] C[facility] LAB;  Service: Cardiovascular;  Laterality: N/A;  . SHOULDER CLOSED REDUCTION Left 01/11/2017   Procedure: CLOSED MANIPULATION SHOULDER;  Surgeon: Loreta Ave, MD;  Location: [facility];  Service: Orthopedics;  Laterality: Left;  . TENDON REPAIR Left    thumb  . TONSILLECTOMY     as child  . WRIST SURGERY      Current Medications: Current Meds  Medication Sig  . Acetaminophen (TYLENOL PO) Take by mouth.  Marland Kitchen amLODipine (NORVASC) 5 MG tablet Take 5 mg by mouth daily.  Marland Kitchen aspirin EC 81 MG tablet Take 81 mg by mouth daily.  Marland Kitchen azelastine (ASTELIN) 0.1 % nasal spray Place into both nostrils 2 (two) times daily. Use in each nostril as directed  . buPROPion (WELLBUTRIN XL) 150 MG 24 hr tablet Take 150 mg by mouth 3 (three) times daily.  . citalopram (CELEXA) 20 MG tablet Take 20 mg by mouth daily.  . clonazePAM (KLONOPIN) 1 MG tablet TAKE 1 TABLET BY MOUTH EVERYDAY AT BEDTIME  . hydrochlorothiazide (HYDRODIURIL) 25 MG tablet TAKE 1 TABLET  BY MOUTH EVERY DAY  . levothyroxine (SYNTHROID, LEVOTHROID) 75 MCG tablet Take 75 mcg by mouth daily.  Marland Kitchen Propylene Glycol-Glycerin (ARTIFICIAL TEARS) 1-0.3 % SOLN 4 drops in each eye  . rosuvastatin (CRESTOR) 10 MG tablet TAKE 1 TABLET (10 MG TOTAL) BY MOUTH AT BEDTIME. *REFILL 09/11/16*  . UNABLE TO FIND Med Name: artificial tears  . VITAMIN D PO Take 5,000 Units by mouth.     Allergies:   Latex   Social History   Socioeconomic History  . Marital status: Married    Spouse name: Not on file  . Number of children: Not on file  . Years of education: Not on file   . Highest education level: Not on file  Occupational History  . Occupation: retired    CommentChief Operating Officer, school  Tobacco Use  . Smoking status: Former Smoker    Quit date: 07/07/1979    Years since quitting: 41.1  . Smokeless tobacco: Never Used  Vaping Use  . Vaping Use: Never used  Substance and Sexual Activity  . Alcohol use: Yes    Comment: occasionally  . Drug use: No  . Sexual activity: Not Currently    Birth control/protection: Post-menopausal  Other Topics Concern  . Not on file  Social History Narrative  . Not on file   Social Determinants of Health   Financial Resource Strain: Not on file  Food Insecurity: Not on file  Transportation Needs: Not on file  Physical Activity: Not on file  Stress: Not on file  Social Connections: Not on file     Family History: The patient's family history includes Breast cancer (age of onset: 59) in her mother; Coronary artery disease in her father; Hypertension in her mother and sister; Kidney failure in her father; Lymphoma in her mother; Melanoma in her paternal aunt and another family member; Ovarian cancer in her paternal aunt; Throat cancer (age of onset: 4) in her sister.  ROS:   Please see the history of present illness.    Frequent falls.  No head trauma.  Shows me numerous bumps and bruises.  This is related to her difficulty with balance.  She says that she has followed up with primary care about this.  All other systems reviewed and are negative.  EKGs/Labs/Other Studies Reviewed:    The following studies were reviewed today: No new data  EKG:  EKG normal sinus rhythm with prominent voltage consistent with LVH.  No change compared to prior.  Recent Labs: No results found for requested labs within last 8760 hours.  Recent Lipid Panel    Component Value Date/Time   CHOL 125 10/02/2014 0801   TRIG 55.0 10/02/2014 0801   HDL 59.30 10/02/2014 0801   CHOLHDL 2 10/02/2014 0801   VLDL 11.0 10/02/2014 0801    LDLCALC 55 10/02/2014 0801    Physical Exam:    VS:  BP 120/70   Pulse 74   Ht 5\' 3"  (1.6 m)   Wt 159 lb (72.1 kg)   SpO2 97%   BMI 28.17 kg/m     Wt Readings from Last 3 Encounters:  08/18/20 159 lb (72.1 kg)  05/19/20 153 lb (69.4 kg)  01/05/20 161 lb (73 kg)     GEN: Obese. No acute distress HEENT: Normal NECK: No JVD. LYMPHATICS: No lymphadenopathy CARDIAC: No murmur. RRR no gallop, or edema. VASCULAR:  Normal Pulses. No bruits. RESPIRATORY:  Clear to auscultation without rales, wheezing or rhonchi  ABDOMEN: Soft, non-tender, non-distended, No pulsatile mass,  MUSCULOSKELETAL: No deformity  SKIN: Warm and dry NEUROLOGIC:  Alert and oriented x 3 PSYCHIATRIC:  Normal affect   ASSESSMENT:    1. Coronary artery disease of native artery of native heart with stable angina pectoris (HCC)   2. Mixed hyperlipidemia   3. Essential hypertension   4. Aortic valve insufficiency, etiology of cardiac valve disease unspecified   5. Educated about COVID-19 virus infection    PLAN:    In order of problems listed above:  1. Stable without angina.  Secondary prevention reviewed.  Strongly encouraged to increase physical activity but this is a challenge with her balance issues. 2. Continue statin therapy.  Target LDL less than 70.  Last LDL 1 year ago was 79.   3. Blood pressures well controlled.  Low-sodium diet is recommended 4. Murmurs not appreciated on today's exam 5. Vaccinated and practicing medication   Medication Adjustments/Labs and Tests Ordered: Current medicines are reviewed at length with the patient today.  Concerns regarding medicines are outlined above.  Orders Placed This Encounter  Procedures  . EKG 12-Lead   No orders of the defined types were placed in this encounter.   Patient Instructions  Medication Instructions:  Your physician recommends that you continue on your current medications as directed. Please refer to the Current Medication list given to  you today.  *If you need a refill on your cardiac medications before your next appointment, please call your pharmacy*   Lab Work: None If you have labs (blood work) drawn today and your tests are completely normal, you will receive your results only by: Marland Kitchen MyChart Message (if you have MyChart) OR . A paper copy in the mail If you have any lab test that is abnormal or we need to change your treatment, we will call you to review the results.   Testing/Procedures: None   Follow-Up: At [facility], you and your health needs are our priority.  As part of our continuing mission to provide you with exceptional heart care, we have created designated Provider Care Teams.  These Care Teams include your primary Cardiologist (physician) and Advanced Practice Providers (APPs -  Physician Assistants and Nurse Practitioners) who all work together to provide you with the care you need, when you need it.  We recommend signing up for the patient portal called "MyChart".  Sign up information is provided on this After Visit Summary.  MyChart is used to connect with patients for Virtual Visits (Telemedicine).  Patients are able to view lab/test results, encounter notes, upcoming appointments, etc.  Non-urgent messages can be sent to your provider as well.   To learn more about what you can do with MyChart, go to [email or web address].    Your next appointment:   1 year(s)  The format for your next appointment:   In Person  Provider:   You may see [name], MD or one of the following Advanced Practice Providers on your designated Care Team:    Norma Fredrickson, NP  Nada Boozer, NP  Georgie Chard, NP    Other Instructions      Signed, [name], MD  08/18/2020 4:22 PM    [facility]

## 2020-08-16 DIAGNOSIS — Z4802 Encounter for removal of sutures: Secondary | ICD-10-CM | POA: Diagnosis not present

## 2020-08-18 ENCOUNTER — Other Ambulatory Visit: Payer: Self-pay

## 2020-08-18 ENCOUNTER — Ambulatory Visit: Payer: Medicare PPO | Admitting: Interventional Cardiology

## 2020-08-18 ENCOUNTER — Encounter: Payer: Self-pay | Admitting: Interventional Cardiology

## 2020-08-18 VITALS — BP 120/70 | HR 74 | Ht 63.0 in | Wt 159.0 lb

## 2020-08-18 DIAGNOSIS — E782 Mixed hyperlipidemia: Secondary | ICD-10-CM

## 2020-08-18 DIAGNOSIS — I10 Essential (primary) hypertension: Secondary | ICD-10-CM | POA: Diagnosis not present

## 2020-08-18 DIAGNOSIS — I25118 Atherosclerotic heart disease of native coronary artery with other forms of angina pectoris: Secondary | ICD-10-CM | POA: Diagnosis not present

## 2020-08-18 DIAGNOSIS — I351 Nonrheumatic aortic (valve) insufficiency: Secondary | ICD-10-CM | POA: Diagnosis not present

## 2020-08-18 DIAGNOSIS — Z7189 Other specified counseling: Secondary | ICD-10-CM | POA: Diagnosis not present

## 2020-08-18 NOTE — Patient Instructions (Signed)
Medication Instructions:  Your physician recommends that you continue on your current medications as directed. Please refer to the Current Medication list given to you today.  *If you need a refill on your cardiac medications before your next appointment, please call your pharmacy*   Lab Work: None If you have labs (blood work) drawn today and your tests are completely normal, you will receive your results only by: . MyChart Message (if you have MyChart) OR . A paper copy in the mail If you have any lab test that is abnormal or we need to change your treatment, we will call you to review the results.   Testing/Procedures: None   Follow-Up: At [facility], you and your health needs are our priority.  As part of our continuing mission to provide you with exceptional heart care, we have created designated Provider Care Teams.  These Care Teams include your primary Cardiologist (physician) and Advanced Practice Providers (APPs -  Physician Assistants and Nurse Practitioners) who all work together to provide you with the care you need, when you need it.  We recommend signing up for the patient portal called "MyChart".  Sign up information is provided on this After Visit Summary.  MyChart is used to connect with patients for Virtual Visits (Telemedicine).  Patients are able to view lab/test results, encounter notes, upcoming appointments, etc.  Non-urgent messages can be sent to your provider as well.   To learn more about what you can do with MyChart, go to [email or web address].    Your next appointment:   1 year(s)  The format for your next appointment:   In Person  Provider:   You may see Henry W Smith III, MD or one of the following Advanced Practice Providers on your designated Care Team:    Lori Gerhardt, NP  Laura Ingold, NP  Jill McDaniel, NP    Other Instructions   

## 2020-09-07 ENCOUNTER — Other Ambulatory Visit: Payer: Self-pay | Admitting: Interventional Cardiology

## 2020-09-16 DIAGNOSIS — I700 Atherosclerosis of aorta: Secondary | ICD-10-CM | POA: Diagnosis not present

## 2020-09-16 DIAGNOSIS — G2581 Restless legs syndrome: Secondary | ICD-10-CM | POA: Diagnosis not present

## 2020-09-16 DIAGNOSIS — Z0000 Encounter for general adult medical examination without abnormal findings: Secondary | ICD-10-CM | POA: Diagnosis not present

## 2020-09-16 DIAGNOSIS — R413 Other amnesia: Secondary | ICD-10-CM | POA: Diagnosis not present

## 2020-09-16 DIAGNOSIS — E039 Hypothyroidism, unspecified: Secondary | ICD-10-CM | POA: Diagnosis not present

## 2020-09-16 DIAGNOSIS — I2510 Atherosclerotic heart disease of native coronary artery without angina pectoris: Secondary | ICD-10-CM | POA: Diagnosis not present

## 2020-09-16 DIAGNOSIS — F324 Major depressive disorder, single episode, in partial remission: Secondary | ICD-10-CM | POA: Diagnosis not present

## 2020-09-16 DIAGNOSIS — E7800 Pure hypercholesterolemia, unspecified: Secondary | ICD-10-CM | POA: Diagnosis not present

## 2020-09-16 DIAGNOSIS — I10 Essential (primary) hypertension: Secondary | ICD-10-CM | POA: Diagnosis not present

## 2020-09-16 DIAGNOSIS — Z1389 Encounter for screening for other disorder: Secondary | ICD-10-CM | POA: Diagnosis not present

## 2020-11-17 ENCOUNTER — Encounter: Payer: Self-pay | Admitting: Neurology

## 2020-11-17 ENCOUNTER — Ambulatory Visit: Payer: Medicare PPO | Admitting: Neurology

## 2020-11-17 VITALS — BP 122/64 | HR 64 | Ht 63.0 in | Wt 155.3 lb

## 2020-11-17 DIAGNOSIS — R2689 Other abnormalities of gait and mobility: Secondary | ICD-10-CM

## 2020-11-17 DIAGNOSIS — Z9181 History of falling: Secondary | ICD-10-CM

## 2020-11-17 DIAGNOSIS — F3289 Other specified depressive episodes: Secondary | ICD-10-CM | POA: Diagnosis not present

## 2020-11-17 DIAGNOSIS — F439 Reaction to severe stress, unspecified: Secondary | ICD-10-CM

## 2020-11-17 NOTE — Patient Instructions (Signed)
It was good to see you again today.  Please continue to try to hydrate well with water.  Consider using a cane for gait safety as you have fallen.  You have finished physical therapy but have not been following the exercises you were provided.  Please continue to exercise on a regular basis and follow the handout/instructions given to you by physical therapy.  You continue to have stress and do not feel happy.  Please talk to your primary care physician about the possibility of seeking counseling versus a different antidepressant medication or both.  You may benefit from seeing a psychiatrist.  Please make an appointment to discuss this with your primary care physician.  I would recommend that you avoid drinking alcohol on a daily basis.  I would also recommend that you talk to Dr. Valentina Lucks about the possibility of reducing your clonazepam dose.  Both alcohol and clonazepam can adversely affect your balance.  At this juncture, I recommend that you follow-up with your primary care physician on a regular basis.  We can see you back in this clinic on an as-needed basis.

## 2020-11-17 NOTE — Progress Notes (Signed)
Subjective:    Patient ID: Beth Christian is a 70 y.o. female.  HPI     Interim history:   Beth Christian is a 70 year old right-handed woman with an underlying complex medical history of coronary artery disease with status post stenting in 2836, embolic stroke in 6294, degenerative joint disease, hypothyroidism, tendinitis, depression, osteopenia, restless leg syndrome, hypertension, hearing loss, sleep apnea, and overweight state, who presents for follow-up consultation of her gait and balance disorder.  The patient is unaccompanied today.  I first met her at the request of her primary care physician on 05/19/2020 at which time she reported difficulty with her gait and her balance.  Her husband was concerned about her lack of motivation and initiative.  She had undergone evaluation for memory issues through [facility] neurology and had seen Dr. Carles Collet in the recent past.  Counseling and evaluation through psychiatry had been discussed, she had not pursued this.  She did worry about her mother's health.  I counseled her regarding daily alcohol consumption. I made a referral to PT.   Today, 11/17/2020: She reports no significant change.  She did go to physical therapy at [facility].  She was not advised to use a walking aid.  She was given instructions regarding exercises at home or handouts but admits that she has not done any of these exercises.  She has fallen.  She bumped her head but there was no sign of bruising or swelling or laceration, she did not lose consciousness and did not have any subsequent headaches.  She did not go to the ER or get checked out elsewhere.  She did have an injury to her right arm and went to [facility] to get stitches. She has not reduced her clonazepam dose.  She continues to drink alcohol on a daily basis and continues to endorse stress at home.  She feels that she is stressed because she cannot speak to her husband and her daughter who lives with them does not have much  in the way of relationship with her.  Her older daughter comes and visits occasionally and she has a better relationship with her older daughter.  She reports being unhappy but not particularly sad.  She denies any sadness or happiness, reports that she no longer smiles, she feels indifferent.  She has not sought counseling or a change in her antidepressant medication or evaluation through psychiatry. She has not addressed this issue with her primary care physician lately.  She does not have an appointment pending, had a physical recently.  She reports that she had suggested marriage counseling early on when she got married to her husband but he did not want to pursue this, so she stopped asking him.   The patient's allergies, current medications, family history, past medical history, past social history, past surgical history and problem list were reviewed and updated as appropriate.   Previously:   05/19/20: (She) reports difficulty with her gait and balance.  Her history is primarily provided by her husband, especially in the beginning as she did not answer right away and he started talking and offering most of the information.  He is primarily worried about her lack of motivation, lack of initiative, unsteady gait, memory loss, falls.  He reports that she does not exercise, she has been forgetful.  She often does not change from her pajamas and sits and watches her cell phone.   I reviewed your office note from 03/15/2020. She was advised to consider counseling.  She declined a psychiatry referral at the time. She was worried about her mother. She had been seeing Dr. Carles Collet in neurology and had work-up in the form of brain MRI as well as neuropsychological testing. I reviewed Dr. 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  She had declined a psychiatry referral.  I reviewed her brain MRI from 12/30/2019, without contrast: IMPRESSION: 1. No acute intracranial abnormality. 2. Mild chronic small vessel ischemia and moderate parenchymal volume loss. 3. Prominence of the supratentorial ventricles with crowding of the high convexity sulci which may be seen in the setting of normal pressure hydrocephalus in the appropriate clinical scenario. This has not significantly changed from prior.     She was not felt to have normal pressure hydrocephalus by Dr. Carles Collet.  She was advised to follow-up as needed.  A repeat neuropsychological evaluation was considered in 1 to 2 years.   She endorses not exercising, she does not hydrate very much with water, they estimate that she drinks about 3 to 4 cups of water per day.  She has had physical therapy on more than one occasion.  She was told to use a walker and cane but does not use either.  She has no telltale family history of dementia but mom is 8 years old and has some memory loss.  She has not pursued counseling or an appointment with psychiatry.  They have not pursued marriage counseling.  Her Past Medical History Is Significant For: Past Medical History:  Diagnosis Date  . Articular cartilage disorder involving shoulder region, left 12/2016  . Coronary artery disease involving native coronary artery 2012   DES RCA x 2 (most recently for ISR)  . CVA (cerebral infarction) 01/25/2011   during heart cath - no residual deficits  . Depression   . Grinding of teeth   . History of CVA (cerebrovascular accident)   . HTN (hypertension)    states under control with meds., has been on med. x 6 mos.  . Hypothyroidism   . Immature cataract of both eyes   . Osteoarthritis of shoulder    left  . Raynaud's disease   . RLS (restless legs syndrome)   . Rotator cuff tear, left 12/2016  . TMJ dysfunction   . Urinary, incontinence, stress female   . Wears hearing aid in both  ears     Her Past Surgical History Is Significant For: Past Surgical History:  Procedure Laterality Date  . BREAST BIOPSY Right 09/15/2014   stromal hyalinization and small benign fibroadenoma  . COLONOSCOPY WITH PROPOFOL N/A 12/02/2012   Procedure: COLONOSCOPY WITH PROPOFOL;  Surgeon: Garlan Fair, MD;  Location: [facility];  Service: Endoscopy;  Laterality: N/A;  . CORONARY ANGIOPLASTY WITH STENT PLACEMENT  01/25/2011   proximal RCA  . LEFT HEART CATHETERIZATION WITH CORONARY ANGIOGRAM N/A 07/07/2011   Procedure: LEFT HEART CATHETERIZATION WITH CORONARY ANGIOGRAM;  Surgeon: Sinclair Grooms, MD;  Location: [facility] CATH LAB;  Service: Cardiovascular;  Laterality: N/A;  Femoral  . ORIF DISTAL RADIUS FRACTURE Right 06/11/2012  . PERCUTANEOUS CORONARY STENT INTERVENTION (PCI-S) N/A 07/07/2011   Procedure: PERCUTANEOUS CORONARY STENT INTERVENTION (PCI-S);  Surgeon: Sinclair Grooms, MD;  Location: [facility] CATH LAB;  Service: Cardiovascular;  Laterality: N/A;  . SHOULDER CLOSED REDUCTION Left 01/11/2017   Procedure: CLOSED MANIPULATION SHOULDER;  Surgeon: Kathryne Hitch  F, MD;  Location: [place];  Service: Orthopedics;  Laterality: Left;  . TENDON REPAIR Left    thumb  . TONSILLECTOMY     as child  . WRIST SURGERY      Her Family History Is Significant For: Family History  Problem Relation Age of Onset  . Hypertension Mother   . Breast cancer Mother 28  . Lymphoma Mother   . Kidney failure Father   . Coronary artery disease Father   . Throat cancer Sister 54  . Hypertension Sister   . Melanoma Other   . Ovarian cancer Paternal Aunt        ? ovar vs cx vs uterine  . Melanoma Paternal Aunt     Her Social History Is Significant For: Social History   Socioeconomic History  . Marital status: Married    Spouse name: Not on file  . Number of children: Not on file  . Years of education: Not on file  . Highest education level: Not on file  Occupational History  .  Occupation: retired    CommentChief Operating Officer, school  Tobacco Use  . Smoking status: Former Smoker    Quit date: 07/07/1979    Years since quitting: 41.3  . Smokeless tobacco: Never Used  Vaping Use  . Vaping Use: Never used  Substance and Sexual Activity  . Alcohol use: Yes    Comment: occasionally  . Drug use: No  . Sexual activity: Not Currently    Birth control/protection: Post-menopausal  Other Topics Concern  . Not on file  Social History Narrative  . Not on file   Social Determinants of Health   Financial Resource Strain: Not on file  Food Insecurity: Not on file  Transportation Needs: Not on file  Physical Activity: Not on file  Stress: Not on file  Social Connections: Not on file    Her Allergies Are:  Allergies  Allergen Reactions  . Latex Itching and Rash  :   Her Current Medications Are:  Outpatient Encounter Medications as of 11/17/2020  Medication Sig  . Acetaminophen (TYLENOL PO) Take by mouth.  Marland Kitchen amLODipine (NORVASC) 5 MG tablet Take 5 mg by mouth daily.  Marland Kitchen aspirin EC 81 MG tablet Take 81 mg by mouth daily.  Marland Kitchen azelastine (ASTELIN) 0.1 % nasal spray Place into both nostrils 2 (two) times daily. Use in each nostril as directed  . buPROPion (WELLBUTRIN XL) 150 MG 24 hr tablet Take 150 mg by mouth 3 (three) times daily.  . citalopram (CELEXA) 20 MG tablet Take 20 mg by mouth daily.  . clonazePAM (KLONOPIN) 1 MG tablet TAKE 1 TABLET BY MOUTH EVERYDAY AT BEDTIME  . hydrochlorothiazide (HYDRODIURIL) 25 MG tablet TAKE 1 TABLET BY MOUTH EVERY DAY  . levothyroxine (SYNTHROID, LEVOTHROID) 75 MCG tablet Take 75 mcg by mouth daily.  Marland Kitchen Propylene Glycol-Glycerin (ARTIFICIAL TEARS) 1-0.3 % SOLN 4 drops in each eye  . rosuvastatin (CRESTOR) 10 MG tablet TAKE 1 TABLET (10 MG TOTAL) BY MOUTH AT BEDTIME. *REFILL 09/11/16*  . UNABLE TO FIND Med Name: artificial tears  . VITAMIN D PO Take 5,000 Units by mouth.   No facility-administered encounter medications on file as of  11/17/2020.  :  Review of Systems:  Out of a complete 14 point review of systems, all are reviewed and negative with the exception of these symptoms as listed below: Review of Systems  Neurological:       Here for 6 month f/u. Reports she is  doing the same since last visit. No real improvement. She sts she has had several falls where she hit her head on the floor, concrete and pavement.  She also reports one fall resulted in stitched at her right elbow.     Objective:  Neurological Exam  Physical Exam Physical Examination:   Vitals:   11/17/20 1019  BP: 122/64  Pulse: 64  SpO2: 97%    General Examination: The patient is a very pleasant 70 y.o. female in no acute distress. She appears well-developed and well-nourished and well groomed.   HEENT: Normocephalic, atraumatic, pupils are equal, round and reactive to light, face is symmetric, no obvious facial masking is noted, no nuchal rigidity, no lip, neck or jaw tremor.  Extraocular tracking is well preserved, hearing is grossly intact, no carotid bruits.  Airway examination reveals mild to moderate mouth dryness, tongue protrudes centrally and palate elevates symmetrically.   Chest: Clear to auscultation without wheezing, rhonchi or crackles noted.  Heart: S1+S2+0, regular and normal without murmurs, rubs or gallops noted.   Abdomen: Soft, non-tender and non-distended.  Extremities: There is no pitting edema in the distal lower extremities bilaterally.   Skin: Warm and dry without trophic changes noted.  Musculoskeletal: exam reveals no obvious joint deformities, tenderness or joint swelling or erythema.   Neurologically:  Mental status: The patient is awake, alert and oriented, but she is minimally verbal, history is primarily provided by her husband.  She is soft-spoken, no dysarthria. Thought process is linear. Mood is constricted and affect is blunted.  Cranial nerves II - XII are as described above under HEENT  exam. Motor exam: Normal bulk, strength and tone is noted. There is no drift, no resting tremor.  No significant postural or action tremor noted today. Reflexes are 1+ throughout. Fine motor skills and coordination: grossly intact.  Cerebellar testing: No dysmetria or intention tremor. There is no truncal or gait ataxia.  Sensory exam: intact to light touch in the upper and lower extremities.  Gait, station and balance: She stands easily. No veering to one side is noted. No leaning to one side is noted. Posture is age-appropriate and stance is narrow based. She walks with better confidence today, not as slowly and cautiously as last time, preserved arm swing, no shuffling noted.   Assessment and Plan:   In summary, TRINTY MARKEN is a very pleasant 70 year old female with an underlying complex medical history of coronary artery disease with status post stenting in 3612, embolic stroke in 2449, degenerative joint disease, hypothyroidism, tendinitis, depression, osteopenia, restless leg syndrome, hypertension, hearing loss, sleep apnea, and overweight state, who presents for follow-up consultation of her gait disorder.  She recently went through physical therapy.  She still reports a recent fall with injury to her right arm and she also fell and bumped her head but did not seek evaluation or follow-up for this.  She denies any headache or actually having any bump on her head or loss of consciousness or subsequent symptoms.  She is advised to continue with the exercises she was given by physical therapy.  Her gait does look improved.   She had recent evaluation through [facility] neurology, had a brain MRI and also neuropsychological evaluation.  She continues to report stress and depressive symptoms.  She is advised to talk to her primary care physician about the possibility of reducing her clonazepam and she is advised to reduce her alcohol consumption and not drink every day.  She is advised  that alcohol  and benzodiazepine medication can adversely affect her balance.  She is furthermore advised to talk to her primary care physician about her lack of motivation and initiative and stress.  She may benefit from counseling and/or change in her antidepressant medication and even evaluation through psychiatry.  She is also encouraged to consider using a cane for gait safety.  At this juncture, she is advised to follow-up with her primary care physician and she can follow-up in this clinic on an as-needed basis.   I answered all her questions today and she was in agreement with the plan. I spent 20 minutes in total face-to-face time and in reviewing records during pre-charting, more than 50% of which was spent in counseling and coordination of care, reviewing test results, reviewing medications and treatment regimen and/or in discussing or reviewing the diagnosis of gait d/o, the prognosis and treatment options. Pertinent laboratory and imaging test results that were available during this visit with the patient were reviewed by me and considered in my medical decision making (see chart for details).

## 2020-11-29 DIAGNOSIS — F325 Major depressive disorder, single episode, in full remission: Secondary | ICD-10-CM | POA: Diagnosis not present

## 2020-11-29 DIAGNOSIS — G3184 Mild cognitive impairment, so stated: Secondary | ICD-10-CM | POA: Diagnosis not present

## 2020-11-29 DIAGNOSIS — R296 Repeated falls: Secondary | ICD-10-CM | POA: Diagnosis not present

## 2020-11-29 DIAGNOSIS — G2581 Restless legs syndrome: Secondary | ICD-10-CM | POA: Diagnosis not present

## 2021-01-13 DIAGNOSIS — F325 Major depressive disorder, single episode, in full remission: Secondary | ICD-10-CM | POA: Diagnosis not present

## 2021-01-13 DIAGNOSIS — R2689 Other abnormalities of gait and mobility: Secondary | ICD-10-CM | POA: Diagnosis not present

## 2021-01-13 DIAGNOSIS — G2581 Restless legs syndrome: Secondary | ICD-10-CM | POA: Diagnosis not present

## 2021-01-13 DIAGNOSIS — G3184 Mild cognitive impairment, so stated: Secondary | ICD-10-CM | POA: Diagnosis not present

## 2021-02-24 DIAGNOSIS — I10 Essential (primary) hypertension: Secondary | ICD-10-CM | POA: Diagnosis not present

## 2021-02-24 DIAGNOSIS — R269 Unspecified abnormalities of gait and mobility: Secondary | ICD-10-CM | POA: Diagnosis not present

## 2021-02-24 DIAGNOSIS — G2581 Restless legs syndrome: Secondary | ICD-10-CM | POA: Diagnosis not present

## 2021-02-24 DIAGNOSIS — G3184 Mild cognitive impairment, so stated: Secondary | ICD-10-CM | POA: Diagnosis not present

## 2021-03-02 DIAGNOSIS — H2512 Age-related nuclear cataract, left eye: Secondary | ICD-10-CM | POA: Diagnosis not present

## 2021-05-23 DIAGNOSIS — Z23 Encounter for immunization: Secondary | ICD-10-CM | POA: Diagnosis not present

## 2021-05-24 ENCOUNTER — Other Ambulatory Visit: Payer: Self-pay

## 2021-05-24 ENCOUNTER — Encounter: Payer: Self-pay | Admitting: Obstetrics and Gynecology

## 2021-05-24 ENCOUNTER — Other Ambulatory Visit (HOSPITAL_COMMUNITY)
Admission: RE | Admit: 2021-05-24 | Discharge: 2021-05-24 | Disposition: A | Discharge status: Home / Self Care | Payer: Medicare PPO | Source: Ambulatory Visit | Attending: Obstetrics and Gynecology | Admitting: Obstetrics and Gynecology

## 2021-05-24 ENCOUNTER — Ambulatory Visit (INDEPENDENT_AMBULATORY_CARE_PROVIDER_SITE_OTHER): Payer: Medicare PPO | Admitting: Obstetrics and Gynecology

## 2021-05-24 VITALS — BP 100/60 | Ht 63.0 in | Wt 156.0 lb

## 2021-05-24 DIAGNOSIS — Z1151 Encounter for screening for human papillomavirus (HPV): Secondary | ICD-10-CM | POA: Diagnosis not present

## 2021-05-24 DIAGNOSIS — Z01419 Encounter for gynecological examination (general) (routine) without abnormal findings: Secondary | ICD-10-CM

## 2021-05-24 DIAGNOSIS — Z13820 Encounter for screening for osteoporosis: Secondary | ICD-10-CM | POA: Diagnosis not present

## 2021-05-24 DIAGNOSIS — R87810 Cervical high risk human papillomavirus (HPV) DNA test positive: Secondary | ICD-10-CM

## 2021-05-24 DIAGNOSIS — Z124 Encounter for screening for malignant neoplasm of cervix: Secondary | ICD-10-CM

## 2021-05-24 DIAGNOSIS — Z1231 Encounter for screening mammogram for malignant neoplasm of breast: Secondary | ICD-10-CM | POA: Diagnosis not present

## 2021-05-24 NOTE — Patient Instructions (Signed)
I value your feedback and you entrusting us with your care. If you get a [place] patient survey, I would appreciate you taking the time to let us know about your experience today. Thank you!  [facility]: [phone]  [facility]: [phone]    

## 2021-05-24 NOTE — Progress Notes (Signed)
PCP: Kirby Funk, MD   Chief Complaint  Patient presents with   Gynecologic Exam    HPI:      [name] is a 70 y.o. No obstetric history on file. who LMP was No LMP recorded. Patient is postmenopausal., presents today for her annual examination.  Her menses are absent due to menopause.  She does not have PMB.  She does have occas vasomotor sx that are tolerable.  Sex activity: not sexually active. She does not have vaginal dryness.  Last Pap: 05/22/19  Results were: no abnormalities . Neg HPV DNA 1/17. Hx of ASCUS/pos HPV DNA 2016.  Hx of STDs: HPV There is a FH of vaginal melanoma.   Last mammogram: 05/19/19  Results were: normal--routine follow-up in 12 months There is a FH of breast cancer in her mom, genetic testing not indicated. There is a possible FH of ovarian cancer in a pat cousin and pat aunt, but unsure if cx vs uterine. The patient does not do self-breast exams.  Colonoscopy: current with PCP.  Repeat due after 10 years.   Tobacco use: quit over 30 yrs ago Alcohol use: none  No drug use. Exercise: not active  She does not get adequate calcium or Vitamin D in her diet. No recent DEXA. States she had it 2 yrs ago but I don't see in Epic. Due for another one anyway. Not getting adequate ca/Vit D/ minimal exercise.  Labs with PCP.   Past Medical History:  Diagnosis Date   Articular cartilage disorder involving shoulder region, left 12/2016   Coronary artery disease involving native coronary artery 2012   DES RCA x 2 (most recently for ISR)   CVA (cerebral infarction) 01/25/2011   during heart cath - no residual deficits   Depression    Grinding of teeth    History of CVA (cerebrovascular accident)    HTN (hypertension)    states under control with meds., has been on med. x 6 mos.   Hypothyroidism    Immature cataract of both eyes    Osteoarthritis of shoulder    left   Raynaud's disease    RLS (restless legs syndrome)    Rotator cuff tear, left  12/2016   TMJ dysfunction    Urinary, incontinence, stress female    Wears hearing aid in both ears     Past Surgical History:  Procedure Laterality Date   BREAST BIOPSY Right 09/15/2014   stromal hyalinization and small benign fibroadenoma   COLONOSCOPY WITH PROPOFOL N/A 12/02/2012   Procedure: COLONOSCOPY WITH PROPOFOL;  Surgeon: Charolett Bumpers, MD;  Location: [facility];  Service: Endoscopy;  Laterality: N/A;   CORONARY ANGIOPLASTY WITH STENT PLACEMENT  01/25/2011   proximal RCA   LEFT HEART CATHETERIZATION WITH CORONARY ANGIOGRAM N/A 07/07/2011   Procedure: LEFT HEART CATHETERIZATION WITH CORONARY ANGIOGRAM;  Surgeon: [name], MD;  Location: [facility] CATH LAB;  Service: Cardiovascular;  Laterality: N/A;  Femoral   ORIF DISTAL RADIUS FRACTURE Right 06/11/2012   PERCUTANEOUS CORONARY STENT INTERVENTION (PCI-S) N/A 07/07/2011   Procedure: PERCUTANEOUS CORONARY STENT INTERVENTION (PCI-S);  Surgeon: [name], MD;  Location: [facility] CATH LAB;  Service: Cardiovascular;  Laterality: N/A;   SHOULDER CLOSED REDUCTION Left 01/11/2017   Procedure: CLOSED MANIPULATION SHOULDER;  Surgeon: Loreta Ave, MD;  Location: [facility];  Service: Orthopedics;  Laterality: Left;   TENDON REPAIR Left    thumb   TONSILLECTOMY     as  child   WRIST SURGERY      Family History  Problem Relation Age of Onset   Hypertension Mother    Breast cancer Mother 22   Lymphoma Mother    Alzheimer's disease Mother    Dementia Mother    Kidney failure Father    Coronary artery disease Father    Throat cancer Sister 60   Hypertension Sister    Ovarian cancer Paternal Aunt        ? ovar vs cx vs uterine   Melanoma Paternal Aunt    Melanoma Other     Social History   Socioeconomic History   Marital status: Married    Spouse name: Not on file   Number of children: Not on file   Years of education: Not on file   Highest education level: Not on file  Occupational History    Occupation: retired    Comment: cafeteria, school  Tobacco Use   Smoking status: Former    Types: Cigarettes    Quit date: 07/07/1979    Years since quitting: 41.9   Smokeless tobacco: Never  Vaping Use   Vaping Use: Never used  Substance and Sexual Activity   Alcohol use: Yes    Comment: occasionally   Drug use: No   Sexual activity: Not Currently    Birth control/protection: Post-menopausal  Other Topics Concern   Not on file  Social History Narrative   Not on file   Social Determinants of Health   Financial Resource Strain: Not on file  Food Insecurity: Not on file  Transportation Needs: Not on file  Physical Activity: Not on file  Stress: Not on file  Social Connections: Not on file  Intimate Partner Violence: Not on file    Current Meds  Medication Sig   Acetaminophen (TYLENOL PO) Take by mouth.   amLODipine (NORVASC) 5 MG tablet Take 5 mg by mouth daily.   aspirin EC 81 MG tablet Take 81 mg by mouth daily.   azelastine (ASTELIN) 0.1 % nasal spray Place into both nostrils 2 (two) times daily. Use in each nostril as directed   buPROPion (WELLBUTRIN XL) 150 MG 24 hr tablet Take 150 mg by mouth 3 (three) times daily.   citalopram (CELEXA) 20 MG tablet Take 20 mg by mouth daily.   clonazePAM (KLONOPIN) 1 MG tablet TAKE 1 TABLET BY MOUTH EVERYDAY AT BEDTIME   hydrochlorothiazide (HYDRODIURIL) 25 MG tablet TAKE 1 TABLET BY MOUTH EVERY DAY   levothyroxine (SYNTHROID, LEVOTHROID) 75 MCG tablet Take 75 mcg by mouth daily.   Propylene Glycol-Glycerin (ARTIFICIAL TEARS) 1-0.3 % SOLN 4 drops in each eye   rosuvastatin (CRESTOR) 10 MG tablet TAKE 1 TABLET (10 MG TOTAL) BY MOUTH AT BEDTIME. *REFILL 09/11/16*   UNABLE TO FIND Med Name: artificial tears   VITAMIN D PO Take 5,000 Units by mouth.      ROS:  Review of Systems  Constitutional:  Negative for fatigue, fever and unexpected weight change.  Respiratory:  Negative for cough, shortness of breath and wheezing.    Cardiovascular:  Negative for chest pain, palpitations and leg swelling.  Gastrointestinal:  Negative for blood in stool, constipation, diarrhea, nausea and vomiting.  Endocrine: Negative for cold intolerance, heat intolerance and polyuria.  Genitourinary:  Negative for dyspareunia, dysuria, flank pain, frequency, genital sores, hematuria, menstrual problem, pelvic pain, urgency, vaginal bleeding, vaginal discharge and vaginal pain.  Musculoskeletal:  Negative for back pain, joint swelling and myalgias.  Skin:  Negative for rash.  Neurological:  Negative for dizziness, syncope, light-headedness, numbness and headaches.  Hematological:  Negative for adenopathy.  Psychiatric/Behavioral:  Negative for agitation, confusion, dysphoric mood, sleep disturbance and suicidal ideas. The patient is not nervous/anxious.     Objective: BP 100/60   Ht 5\' 3"  (1.6 m)   Wt 156 lb (70.8 kg)   BMI 27.63 kg/m    Physical Exam Constitutional:      Appearance: She is well-developed.  Genitourinary:     Vulva normal.     Right Labia: No rash, tenderness or lesions.    Left Labia: No tenderness, lesions or rash.    No vaginal discharge, erythema or tenderness.      Right Adnexa: not tender and no mass present.    Left Adnexa: not tender and no mass present.    No cervical motion tenderness, friability or polyp.     Uterus is not enlarged or tender.  Breasts:    Right: No mass, nipple discharge, skin change or tenderness.     Left: No mass, nipple discharge, skin change or tenderness.  Neck:     Thyroid: No thyromegaly.  Cardiovascular:     Rate and Rhythm: Normal rate and regular rhythm.     Heart sounds: Normal heart sounds. No murmur heard. Pulmonary:     Effort: Pulmonary effort is normal.     Breath sounds: Normal breath sounds.  Abdominal:     Palpations: Abdomen is soft.     Tenderness: There is no abdominal tenderness. There is no guarding or rebound.  Musculoskeletal:        General:  Normal range of motion.     Cervical back: Normal range of motion.  Lymphadenopathy:     Cervical: No cervical adenopathy.  Neurological:     General: No focal deficit present.     Mental Status: She is alert and oriented to person, place, and time.     Cranial Nerves: No cranial nerve deficit.  Skin:    General: Skin is warm and dry.  Psychiatric:        Mood and Affect: Mood normal.        Behavior: Behavior normal.        Thought Content: Thought content normal.        Judgment: Judgment normal.  Vitals reviewed.    Assessment/Plan:  Encounter for annual routine gynecological examination  Cervical cancer screening - Plan: Cytology - PAP  Screening for HPV (human papillomavirus) - Plan: Cytology - PAP  Cervical high risk human papillomavirus (HPV) DNA test positive - Plan: Cytology - PAP; repeat pap today. Will f/u if abnormal.  Encounter for screening mammogram for malignant neoplasm of breast - Plan: MM 3D SCREEN BREAST BILATERAL; pt to sched mammo  Screening for osteoporosis - Plan: DG Bone Density; pt to sched DEXA, same time as mammo.          GYN counsel mammography screening, menopause, osteoporosis, adequate intake of calcium and vitamin D, diet and exercise    F/U  Return in about 2 years (around 05/25/2023) for annual.  Tonjua Rossetti B. Tyshauna Finkbiner, PA-C 05/24/2021 11:24 AM

## 2021-05-31 LAB — CYTOLOGY - PAP
Comment: NEGATIVE
Diagnosis: UNDETERMINED — AB
High risk HPV: NEGATIVE

## 2021-06-14 ENCOUNTER — Ambulatory Visit
Admission: RE | Admit: 2021-06-14 | Discharge: 2021-06-14 | Disposition: A | Discharge status: Home / Self Care | Payer: Medicare PPO | Source: Ambulatory Visit | Attending: Obstetrics and Gynecology | Admitting: Obstetrics and Gynecology

## 2021-06-14 ENCOUNTER — Other Ambulatory Visit: Payer: Self-pay

## 2021-06-14 DIAGNOSIS — Z13820 Encounter for screening for osteoporosis: Secondary | ICD-10-CM | POA: Insufficient documentation

## 2021-06-14 DIAGNOSIS — M8589 Other specified disorders of bone density and structure, multiple sites: Secondary | ICD-10-CM | POA: Insufficient documentation

## 2021-06-14 DIAGNOSIS — Z1231 Encounter for screening mammogram for malignant neoplasm of breast: Secondary | ICD-10-CM | POA: Diagnosis not present

## 2021-06-14 DIAGNOSIS — Z780 Asymptomatic menopausal state: Secondary | ICD-10-CM | POA: Diagnosis not present

## 2021-06-14 DIAGNOSIS — E039 Hypothyroidism, unspecified: Secondary | ICD-10-CM | POA: Diagnosis not present

## 2021-06-14 DIAGNOSIS — M85852 Other specified disorders of bone density and structure, left thigh: Secondary | ICD-10-CM | POA: Diagnosis not present

## 2021-07-19 ENCOUNTER — Other Ambulatory Visit: Payer: Self-pay | Admitting: Physician Assistant

## 2021-07-19 ENCOUNTER — Ambulatory Visit
Admission: RE | Admit: 2021-07-19 | Discharge: 2021-07-19 | Disposition: A | Discharge status: Home / Self Care | Payer: Medicare PPO | Source: Ambulatory Visit | Attending: Physician Assistant | Admitting: Physician Assistant

## 2021-07-19 DIAGNOSIS — K5900 Constipation, unspecified: Secondary | ICD-10-CM

## 2021-07-19 DIAGNOSIS — R1030 Lower abdominal pain, unspecified: Secondary | ICD-10-CM

## 2021-07-19 DIAGNOSIS — R350 Frequency of micturition: Secondary | ICD-10-CM

## 2021-08-28 ENCOUNTER — Other Ambulatory Visit: Payer: Self-pay | Admitting: Interventional Cardiology

## 2021-09-20 DIAGNOSIS — K5900 Constipation, unspecified: Secondary | ICD-10-CM | POA: Diagnosis not present

## 2021-09-20 DIAGNOSIS — G2581 Restless legs syndrome: Secondary | ICD-10-CM | POA: Diagnosis not present

## 2021-09-20 DIAGNOSIS — I10 Essential (primary) hypertension: Secondary | ICD-10-CM | POA: Diagnosis not present

## 2021-09-20 DIAGNOSIS — I700 Atherosclerosis of aorta: Secondary | ICD-10-CM | POA: Diagnosis not present

## 2021-09-20 DIAGNOSIS — I2510 Atherosclerotic heart disease of native coronary artery without angina pectoris: Secondary | ICD-10-CM | POA: Diagnosis not present

## 2021-09-20 DIAGNOSIS — E039 Hypothyroidism, unspecified: Secondary | ICD-10-CM | POA: Diagnosis not present

## 2021-09-20 DIAGNOSIS — Z0000 Encounter for general adult medical examination without abnormal findings: Secondary | ICD-10-CM | POA: Diagnosis not present

## 2021-09-20 DIAGNOSIS — F3341 Major depressive disorder, recurrent, in partial remission: Secondary | ICD-10-CM | POA: Diagnosis not present

## 2021-09-20 DIAGNOSIS — E7800 Pure hypercholesterolemia, unspecified: Secondary | ICD-10-CM | POA: Diagnosis not present

## 2021-09-20 DIAGNOSIS — Z1389 Encounter for screening for other disorder: Secondary | ICD-10-CM | POA: Diagnosis not present

## 2021-09-20 DIAGNOSIS — G3184 Mild cognitive impairment, so stated: Secondary | ICD-10-CM | POA: Diagnosis not present

## 2021-10-11 NOTE — Progress Notes (Deleted)
?Cardiology Office Note:   ? ?Date:  10/11/2021  ? ?ID:  Beth Christian, DOB 06-22-1951, MRN [id number] ? ?PCP:  Kirby Funk, MD  ?Cardiologist:  [name], MD  ? ?Referring MD: Kirby Funk, MD  ? ?No chief complaint on file. ? ? ?History of Present Illness:   ? ?Beth Christian is a 71 y.o. female with a hx of CAD with RCA DES 2012, procedure related embolic stroke, hypertension, decreased memory, depression, and hyperlipidemia. ? ? ?*** ? ?Past Medical History:  ?Diagnosis Date  ? Articular cartilage disorder involving shoulder region, left 12/2016  ? Coronary artery disease involving native coronary artery 2012  ? DES RCA x 2 (most recently for ISR)  ? CVA (cerebral infarction) 01/25/2011  ? during heart cath - no residual deficits  ? Depression   ? Grinding of teeth   ? History of CVA (cerebrovascular accident)   ? HTN (hypertension)   ? states under control with meds., has been on med. x 6 mos.  ? Hypothyroidism   ? Immature cataract of both eyes   ? Osteoarthritis of shoulder   ? left  ? Raynaud's disease   ? RLS (restless legs syndrome)   ? Rotator cuff tear, left 12/2016  ? TMJ dysfunction   ? Urinary, incontinence, stress female   ? Wears hearing aid in both ears   ? ? ?Past Surgical History:  ?Procedure Laterality Date  ? BREAST BIOPSY Right 09/15/2014  ? stromal hyalinization and small benign fibroadenoma  ? COLONOSCOPY WITH PROPOFOL N/A 12/02/2012  ? Procedure: COLONOSCOPY WITH PROPOFOL;  Surgeon: Charolett Bumpers, MD;  Location: [facility];  Service: Endoscopy;  Laterality: N/A;  ? CORONARY ANGIOPLASTY WITH STENT PLACEMENT  01/25/2011  ? proximal RCA  ? LEFT HEART CATHETERIZATION WITH CORONARY ANGIOGRAM N/A 07/07/2011  ? Procedure: LEFT HEART CATHETERIZATION WITH CORONARY ANGIOGRAM;  Surgeon: [name], MD;  Location: [facility] C[facility] LAB;  Service: Cardiovascular;  Laterality: N/A;  Femoral  ? ORIF DISTAL RADIUS FRACTURE Right 06/11/2012  ? PERCUTANEOUS CORONARY STENT INTERVENTION (PCI-S)  N/A 07/07/2011  ? Procedure: PERCUTANEOUS CORONARY STENT INTERVENTION (PCI-S);  Surgeon: [name], MD;  Location: [facility] CATH LAB;  Service: Cardiovascular;  Laterality: N/A;  ? SHOULDER CLOSED REDUCTION Left 01/11/2017  ? Procedure: CLOSED MANIPULATION SHOULDER;  Surgeon: Loreta Ave, MD;  Location: [facility];  Service: Orthopedics;  Laterality: Left;  ? TENDON REPAIR Left   ? thumb  ? TONSILLECTOMY    ? as child  ? WRIST SURGERY    ? ? ?Current Medications: ?No outpatient medications have been marked as taking for the 10/12/21 encounter (Appointment) with Lyn Records, MD.  ?  ? ?Allergies:   Latex  ? ?Social History  ? ?Socioeconomic History  ? Marital status: Married  ?  Spouse name: Not on file  ? Number of children: Not on file  ? Years of education: Not on file  ? Highest education level: Not on file  ?Occupational History  ? 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 Smoking status: Former  ?  Types: Cigarettes  ?  Quit date: 07/07/1979  ?  Years since quitting: 42.2  ? Smokeless tobacco: Never  ?Vaping Use  ? Vaping Use: Never used  ?Substance and Sexual Activity  ? Alcohol use: Yes  ?  Comment: occasionally  ? Drug use: No  ? Sexual activity: Not Currently  ?  Birth  control/protection: Post-menopausal  ?Other Topics Concern  ? Not on file  ?Social History Narrative  ? Not on file  ? ?Social Determinants of Health  ? ?Financial Resource Strain: Not on file  ?Food Insecurity: Not on file  ?Transportation Needs: Not on file  ?Physical Activity: Not on file  ?Stress: Not on file  ?Social Connections: Not on file  ?  ? ?Family History: ?The patient's family history includes Alzheimer's disease in her mother; Breast cancer (age of onset: 74) in her mother; Coronary artery disease in her father; Dementia in her mother; Hypertension in her mother and sister; Kidney failure in her father; Lymphoma in her mother; Melanoma in her paternal aunt and another family  member; Ovarian cancer in her paternal aunt; Throat cancer (age of onset: 72) in her sister. ? ?ROS:   ?Please see the history of present illness.    ?*** All other systems reviewed and are negative. ? ?EKGs/Labs/Other Studies Reviewed:   ? ?The following studies were reviewed today: ?ECHOCARDIOGRAM 2016: ?Study Conclusions  ? ?- Left ventricle: The cavity size was normal. Wall thickness was  ?  increased in a pattern of mild LVH. Systolic function was normal.  ?  The estimated ejection fraction was in the range of 60% to 65%.  ?  Wall motion was normal; there were no regional wall motion  ?  abnormalities. Doppler parameters are consistent with abnormal  ?  left ventricular relaxation (grade 1 diastolic dysfunction). The  ?  E/e&' ratio is between 8-15, suggesting indeterminate LV filling  ?  pressure.  ?- Aortic valve: Sclerosis without stenosis. There was mild to  ?  moderate regurgitation. Regurgitation pressure half-time: 374 ms.  ?- Mitral valve: Calcified annulus. There was mild regurgitation.  ?- Left atrium: The atrium was normal in size.  ?- Tricuspid valve: There was trivial regurgitation.  ?- Pulmonary arteries: PA peak pressure: 29 mm Hg (S).  ?- Inferior vena cava: The vessel was normal in size. The  ?  respirophasic diameter changes were in the normal range (= 50%),  ?  consistent with normal central venous pressure.  ? ?Impressions:  ? ?- LVEF 60-65%, mild LVH, normal wall motion, diastolic dysfunction,  ?  indeterminate LV filling pressure, aortic sclerosis with mild to  ?  moderate AI, mild MR, trivial TR, normal RVSP.  ? ?EKG:  EKG *** ? ?Recent Labs: ?No results found for requested labs within last 8760 hours.  ?Recent Lipid Panel ?   ?Component Value Date/Time  ? CHOL 125 10/02/2014 0801  ? TRIG 55.0 10/02/2014 0801  ? HDL 59.30 10/02/2014 0801  ? CHOLHDL 2 10/02/2014 0801  ? VLDL 11.0 10/02/2014 0801  ? LDLCALC 55 10/02/2014 0801  ? ? ?Physical Exam:   ? ?VS:  There were no vitals taken for  this visit.   ? ?Wt Readings from Last 3 Encounters:  ?05/24/21 156 lb (70.8 kg)  ?11/17/20 155 lb 5 oz (70.4 kg)  ?08/18/20 159 lb (72.1 kg)  ?  ? ?GEN: ***. No acute distress ?HEENT: Normal ?NECK: No JVD. ?LYMPHATICS: No lymphadenopathy ?CARDIAC: *** murmur. RRR *** gallop, or edema. ?VASCULAR: *** Normal Pulses. No bruits. ?RESPIRATORY:  Clear to auscultation without rales, wheezing or rhonchi  ?ABDOMEN: Soft, non-tender, non-distended, No pulsatile mass, ?MUSCULOSKELETAL: No deformity  ?SKIN: Warm and dry ?NEUROLOGIC:  Alert and oriented x 3 ?PSYCHIATRIC:  Normal affect  ? ?ASSESSMENT:   ? ?1. Coronary artery disease of native artery of native heart with stable  angina pectoris (HCC)   ?2. Mixed hyperlipidemia   ?3. Essential hypertension   ?4. Aortic valve insufficiency, etiology of cardiac valve disease unspecified   ? ?PLAN:   ? ?In order of problems listed above: ? ?*** ? ? ?Medication Adjustments/Labs and Tests Ordered: ?Current medicines are reviewed at length with the patient today.  Concerns regarding medicines are outlined above.  ?No orders of the defined types were placed in this encounter. ? ?No orders of the defined types were placed in this encounter. ? ? ?There are no Patient Instructions on file for this visit.  ? ?Signed, ?[name], MD  ?10/11/2021 6:40 PM    ?[facility] ?

## 2021-10-12 ENCOUNTER — Ambulatory Visit: Payer: Medicare PPO | Admitting: Interventional Cardiology

## 2021-10-12 DIAGNOSIS — I10 Essential (primary) hypertension: Secondary | ICD-10-CM

## 2021-10-12 DIAGNOSIS — E782 Mixed hyperlipidemia: Secondary | ICD-10-CM

## 2021-10-12 DIAGNOSIS — I25118 Atherosclerotic heart disease of native coronary artery with other forms of angina pectoris: Secondary | ICD-10-CM

## 2021-10-12 DIAGNOSIS — I351 Nonrheumatic aortic (valve) insufficiency: Secondary | ICD-10-CM

## 2021-11-13 NOTE — Progress Notes (Signed)
?Cardiology Office Note:   ? ?Date:  11/14/2021  ? ?ID:  Beth CorneaLinda S Rigdon, DOB 11/29/1950, MRN [id number] ? ?PCP:  Kirby FunkGriffin, John, MD  ?Cardiologist:  Lesleigh NoeHenry W Norrin Shreffler III, MD  ? ?Referring MD: Kirby FunkGriffin, John, MD  ? ?Chief Complaint  ?Patient presents with  ? Coronary Artery Disease  ? Hypertension  ? Hyperlipidemia  ? ? ?History of Present Illness:   ? ?Beth Christian is a 71 y.o. female with a hx of CAD with RCA DES 2012, procedure related embolic stroke, primary  hypertension, decreased memory, depression, and hyperlipidemia. ? ? ?She says the left side of her body does not work as well.  She has some unstable gait since her embolic stroke that occurred as a consequence of coronary angiography.  She is not able to drive.  Her husband did bring her to the office visit today. ? ?She is quite sedentary at home.  She denies angina, excessive dyspnea, orthopnea, edema, and medication side effects. ? ?Past Medical History:  ?Diagnosis Date  ? Articular cartilage disorder involving shoulder region, left 12/2016  ? Coronary artery disease involving native coronary artery 2012  ? DES RCA x 2 (most recently for ISR)  ? CVA (cerebral infarction) 01/25/2011  ? during heart cath - no residual deficits  ? Depression   ? Grinding of teeth   ? History of CVA (cerebrovascular accident)   ? HTN (hypertension)   ? states under control with meds., has been on med. x 6 mos.  ? Hypothyroidism   ? Immature cataract of both eyes   ? Osteoarthritis of shoulder   ? left  ? Raynaud's disease   ? RLS (restless legs syndrome)   ? Rotator cuff tear, left 12/2016  ? TMJ dysfunction   ? Urinary, incontinence, stress female   ? Wears hearing aid in both ears   ? ? ?Past Surgical History:  ?Procedure Laterality Date  ? BREAST BIOPSY Right 09/15/2014  ? stromal hyalinization and small benign fibroadenoma  ? COLONOSCOPY WITH PROPOFOL N/A 12/02/2012  ? Procedure: COLONOSCOPY WITH PROPOFOL;  Surgeon: Charolett BumpersMartin K Johnson, MD;  Location: [facility];  Service:  Endoscopy;  Laterality: N/A;  ? CORONARY ANGIOPLASTY WITH STENT PLACEMENT  01/25/2011  ? proximal RCA  ? LEFT HEART CATHETERIZATION WITH CORONARY ANGIOGRAM N/A 07/07/2011  ? Procedure: LEFT HEART CATHETERIZATION WITH CORONARY ANGIOGRAM;  Surgeon: Lesleigh NoeHenry W Kalan Yeley III, MD;  [facility];  Service: Cardiovascular;  Laterality: N/A;  Femoral  ? ORIF DISTAL RADIUS FRACTURE Right 06/11/2012  ? PERCUTANEOUS CORONARY STENT INTERVENTION (PCI-S) N/A 07/07/2011  ? Procedure: PERCUTANEOUS CORONARY STENT INTERVENTION (PCI-S);  Surgeon: Lesleigh NoeHenry W Lyrika Souders III, MD;  [facility];  Service: Cardiovascular;  Laterality: N/A;  ? SHOULDER CLOSED REDUCTION Left 01/11/2017  ? Procedure: CLOSED MANIPULATION SHOULDER;  Surgeon: Loreta AveMurphy, Daniel F, MD;  Location: [facility];  Service: Orthopedics;  Laterality: Left;  ? TENDON REPAIR Left   ? thumb  ? TONSILLECTOMY    ? as child  ? WRIST SURGERY    ? ? ?Current Medications: ?Current Meds  ?Medication Sig  ? Acetaminophen (TYLENOL PO) Take 500 mg by mouth as needed.  ? amLODipine (NORVASC) 5 MG tablet Take 5 mg by mouth daily.  ? aspirin EC 81 MG tablet Take 81 mg by mouth daily.  ? azelastine (ASTELIN) 0.1 % nasal spray Place into both nostrils 2 (two) times daily. Use in each nostril as directed  ? buPROPion (WELLBUTRIN XL) 150 MG  24 hr tablet Take 150 mg by mouth 3 (three) times daily.  ? citalopram (CELEXA) 20 MG tablet Take 20 mg by mouth daily.  ? clonazePAM (KLONOPIN) 1 MG tablet TAKE 1 TABLET BY MOUTH EVERYDAY AT BEDTIME  ? donepezil (ARICEPT) 10 MG tablet Take 10 mg by mouth at bedtime.  ? hydrochlorothiazide (HYDRODIURIL) 25 MG tablet Take 1 tablet (25 mg total) by mouth daily. Please keep upcoming appt with Dr. Katrinka Blazing in [facility] 2023 before anymore refills. Thank you Final Attempt  ? levothyroxine (SYNTHROID, LEVOTHROID) 75 MCG tablet Take 75 mcg by mouth daily.  ? Propylene Glycol-Glycerin (ARTIFICIAL TEARS) 1-0.3 % SOLN 4 drops in each eye  ? rosuvastatin  (CRESTOR) 10 MG tablet TAKE 1 TABLET (10 MG TOTAL) BY MOUTH AT BEDTIME. *REFILL 09/11/16*  ? UNABLE TO FIND Med Name: artificial tears  ? VITAMIN D PO Take 5,000 Units by mouth.  ?  ? ?Allergies:   Latex  ? ?Social History  ? ?Socioeconomic History  ? Marital status: Married  ?  Spouse name: Not on file  ? Number of children: Not on file  ? Years of education: Not on file  ? Highest education level: Not on file  ?Occupational History  ? 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 Smoking status: Former  ?  Types: Cigarettes  ?  Quit date: 07/07/1979  ?  Years since quitting: 42.3  ? Smokeless tobacco: Never  ?Vaping Use  ? Vaping Use: Never used  ?Substance and Sexual Activity  ? Alcohol use: Yes  ?  Comment: occasionally  ? Drug use: No  ? Sexual activity: Not Currently  ?  Birth control/protection: Post-menopausal  ?Other Topics Concern  ? Not on file  ?Social History Narrative  ? Not on file  ? ?Social Determinants of Health  ? ?Financial Resource Strain: Not on file  ?Food Insecurity: Not on file  ?Transportation Needs: Not on file  ?Physical Activity: Not on file  ?Stress: Not on file  ?Social Connections: Not on file  ?  ? ?Family History: ?The patient's family history includes Alzheimer's disease in her mother; Breast cancer (age of onset: 83) in her mother; Coronary artery disease in her father; Dementia in her mother; Hypertension in her mother and sister; Kidney failure in her father; Lymphoma in her mother; Melanoma in her paternal aunt and another family member; Ovarian cancer in her paternal aunt; Throat cancer (age of onset: 47) in her sister. ? ?ROS:   ?Please see the history of present illness.    ?She wonders if she is retaining fluid.  She is not short of breath and does not have edema.  She is compliant with her medication regimen.  All other systems reviewed and are negative. ? ?EKGs/Labs/Other Studies Reviewed:   ? ?The following studies were reviewed today: ?No new  imaging: ? ?2D Doppler echocardiogram 2016: ?Study Conclusions  ? ?- Left ventricle: The cavity size was normal. Wall thickness was  ?  increased in a pattern of mild LVH. Systolic function was normal.  ?  The estimated ejection fraction was in the range of 60% to 65%.  ?  Wall motion was normal; there were no regional wall motion  ?  abnormalities. Doppler parameters are consistent with abnormal  ?  left ventricular relaxation (grade 1 diastolic dysfunction). The  ?  E/e&' ratio is between 8-15, suggesting indeterminate LV filling  ?  pressure.  ?- Aortic valve: Sclerosis without stenosis. There  was mild to  ?  moderate regurgitation. Regurgitation pressure half-time: 374 ms.  ?- Mitral valve: Calcified annulus. There was mild regurgitation.  ?- Left atrium: The atrium was normal in size.  ?- Tricuspid valve: There was trivial regurgitation.  ?- Pulmonary arteries: PA peak pressure: 29 mm Hg (S).  ?- Inferior vena cava: The vessel was normal in size. The  ?  respirophasic diameter changes were in the normal range (= 50%),  ?  consistent with normal central venous pressure.  ? ?Impressions:  ? ?- LVEF 60-65%, mild LVH, normal wall motion, diastolic dysfunction,  ?  indeterminate LV filling pressure, aortic sclerosis with mild to  ?  moderate AI, mild MR, trivial TR, normal RVSP.  ? ?EKG:  EKG normal sinus rhythm, prominent voltage, nonspecific ST abnormality in inferior leads.  When compared to the prior tracing performed August 18, 2020, no significant changes occurred. ? ?Recent Labs: ?No results found for requested labs within last 8760 hours.  ?Recent Lipid Panel ?   ?Component Value Date/Time  ? CHOL 125 10/02/2014 0801  ? TRIG 55.0 10/02/2014 0801  ? HDL 59.30 10/02/2014 0801  ? CHOLHDL 2 10/02/2014 0801  ? VLDL 11.0 10/02/2014 0801  ? LDLCALC 55 10/02/2014 0801  ? ? ?Physical Exam:   ? ?VS:  BP 138/70   Pulse 62   Ht 5\' 3"  (1.6 m)   Wt 161 lb (73 kg)   SpO2 98%   BMI 28.52 kg/m?    ? ?Wt Readings from  Last 3 Encounters:  ?11/14/21 161 lb (73 kg)  ?05/24/21 156 lb (70.8 kg)  ?11/17/20 155 lb 5 oz (70.4 kg)  ?  ? ?GEN: Overweight. No acute distress ?HEENT: Normal ?NECK: No JVD. ?LYMPHATICS: No lymphadenopathy ?CARDIAC:

## 2021-11-14 ENCOUNTER — Encounter: Payer: Self-pay | Admitting: Interventional Cardiology

## 2021-11-14 ENCOUNTER — Ambulatory Visit: Payer: Medicare PPO | Admitting: Interventional Cardiology

## 2021-11-14 ENCOUNTER — Other Ambulatory Visit: Payer: Self-pay | Admitting: Interventional Cardiology

## 2021-11-14 VITALS — BP 138/70 | HR 62 | Ht 63.0 in | Wt 161.0 lb

## 2021-11-14 DIAGNOSIS — I25118 Atherosclerotic heart disease of native coronary artery with other forms of angina pectoris: Secondary | ICD-10-CM

## 2021-11-14 DIAGNOSIS — E782 Mixed hyperlipidemia: Secondary | ICD-10-CM

## 2021-11-14 DIAGNOSIS — I10 Essential (primary) hypertension: Secondary | ICD-10-CM | POA: Diagnosis not present

## 2021-11-14 DIAGNOSIS — I351 Nonrheumatic aortic (valve) insufficiency: Secondary | ICD-10-CM | POA: Diagnosis not present

## 2021-11-14 NOTE — Patient Instructions (Signed)

## 2022-01-05 DIAGNOSIS — R42 Dizziness and giddiness: Secondary | ICD-10-CM | POA: Diagnosis not present

## 2022-01-05 DIAGNOSIS — H900 Conductive hearing loss, bilateral: Secondary | ICD-10-CM | POA: Diagnosis not present

## 2022-01-05 DIAGNOSIS — H6123 Impacted cerumen, bilateral: Secondary | ICD-10-CM | POA: Diagnosis not present

## 2022-01-10 DIAGNOSIS — H903 Sensorineural hearing loss, bilateral: Secondary | ICD-10-CM | POA: Diagnosis not present

## 2022-01-10 DIAGNOSIS — R42 Dizziness and giddiness: Secondary | ICD-10-CM | POA: Diagnosis not present

## 2022-02-13 DIAGNOSIS — M25561 Pain in right knee: Secondary | ICD-10-CM | POA: Diagnosis not present

## 2022-02-16 DIAGNOSIS — L570 Actinic keratosis: Secondary | ICD-10-CM | POA: Diagnosis not present

## 2022-02-16 DIAGNOSIS — L565 Disseminated superficial actinic porokeratosis (DSAP): Secondary | ICD-10-CM | POA: Diagnosis not present

## 2022-02-16 DIAGNOSIS — L821 Other seborrheic keratosis: Secondary | ICD-10-CM | POA: Diagnosis not present

## 2022-02-21 DIAGNOSIS — M25561 Pain in right knee: Secondary | ICD-10-CM | POA: Diagnosis not present

## 2022-03-03 DIAGNOSIS — N3941 Urge incontinence: Secondary | ICD-10-CM | POA: Diagnosis not present

## 2022-03-03 DIAGNOSIS — R350 Frequency of micturition: Secondary | ICD-10-CM | POA: Diagnosis not present

## 2022-03-03 DIAGNOSIS — R3915 Urgency of urination: Secondary | ICD-10-CM | POA: Diagnosis not present

## 2022-03-03 DIAGNOSIS — R351 Nocturia: Secondary | ICD-10-CM | POA: Diagnosis not present

## 2022-03-07 DIAGNOSIS — M25561 Pain in right knee: Secondary | ICD-10-CM | POA: Diagnosis not present

## 2022-03-27 DIAGNOSIS — M25561 Pain in right knee: Secondary | ICD-10-CM | POA: Diagnosis not present

## 2022-03-28 DIAGNOSIS — F325 Major depressive disorder, single episode, in full remission: Secondary | ICD-10-CM | POA: Diagnosis not present

## 2022-03-28 DIAGNOSIS — G2581 Restless legs syndrome: Secondary | ICD-10-CM | POA: Diagnosis not present

## 2022-03-28 DIAGNOSIS — I10 Essential (primary) hypertension: Secondary | ICD-10-CM | POA: Diagnosis not present

## 2022-04-04 DIAGNOSIS — M25561 Pain in right knee: Secondary | ICD-10-CM | POA: Diagnosis not present

## 2022-04-06 ENCOUNTER — Telehealth: Payer: Self-pay

## 2022-04-06 DIAGNOSIS — M25561 Pain in right knee: Secondary | ICD-10-CM | POA: Diagnosis not present

## 2022-04-06 NOTE — Telephone Encounter (Signed)
   Pre-operative Risk Assessment    Patient Name: Beth Christian  DOB: May 27, 1951 MRN: [id number]      Request for Surgical Clearance    Procedure:   Rt knee scope medial and lateral menisectomies  Date of Surgery:  Clearance TBD                                 Surgeon:  Frederico Hamman, MD Surgeon's Group or Practice Name:  [facility] Phone number:  [phone] Fax number:  [phone]   Type of Clearance Requested:   - Medical  - Pharmacy:  Hold Aspirin     Type of Anesthesia:   Choice   Additional requests/questions:   None  Signed, Lorcan Shelp   04/06/2022, 12:57 PM

## 2022-04-07 NOTE — Telephone Encounter (Signed)
   Name: Beth Christian  DOB: 1951-06-07  MRN: [id number]  Primary Cardiologist: [name], MD  Chart reviewed as part of pre-operative protocol coverage. Because of Beth Christian past medical history and time since last visit, she will require a follow-up telephone visit in order to better assess preoperative cardiovascular risk.  Pre-op covering staff: - Please schedule appointment and call patient to inform them. If patient already had an upcoming appointment within acceptable timeframe, please add "pre-op clearance" to the appointment notes so provider is aware. - Please contact requesting surgeon's office via preferred method (i.e, phone, fax) to inform them of need for appointment prior to surgery.  Per our office protocol, okay to hold aspirin x7 days prior to the procedure for remote RCA stenting back in 2012.  Please resume once medically safe to do so.  Sharlene Dory, PA-C  04/07/2022, 11:58 AM

## 2022-04-10 ENCOUNTER — Telehealth: Payer: Self-pay | Admitting: *Deleted

## 2022-04-10 NOTE — Telephone Encounter (Signed)
I called and s/w DPR pt's husband as pt is [facility] and wears hearing aids. Pt has been set up for tele pre op appt 04/18/22 @ 9:40. Per pt's husband med rec and consent are done.

## 2022-04-10 NOTE — Telephone Encounter (Signed)
I called and s/w DPR pt's husband as pt is [name] and wears hearing aids. Pt has been set up for tele pre op appt 04/18/22 @ 9:40. Per pt's husband med rec and consent are done.     Patient Consent for Virtual Visit        RAMANDEEP Christian has provided verbal consent on 04/10/2022 for a virtual visit (video or telephone).   CONSENT FOR VIRTUAL VISIT FOR:  Beth Christian  By participating in this virtual visit I agree to the following:  I hereby voluntarily request, consent and authorize [facility] and its employed or contracted physicians, physician assistants, nurse practitioners or other licensed health care professionals (the Practitioner), to provide me with telemedicine health care services (the "Services") as deemed necessary by the treating Practitioner. I acknowledge and consent to receive the Services by the Practitioner via telemedicine. I understand that the telemedicine visit will involve communicating with the Practitioner through live audiovisual communication technology and the disclosure of certain medical information by electronic transmission. I acknowledge that I have been given the opportunity to request an in-person assessment or other available alternative prior to the telemedicine visit and am voluntarily participating in the telemedicine visit.  I understand that I have the right to withhold or withdraw my consent to the use of telemedicine in the course of my care at any time, without affecting my right to future care or treatment, and that the Practitioner or I may terminate the telemedicine visit at any time. I understand that I have the right to inspect all information obtained and/or recorded in the course of the telemedicine visit and may receive copies of available information for a reasonable fee.  I understand that some of the potential risks of receiving the Services via telemedicine include:  Delay or interruption in medical evaluation due to technological  equipment failure or disruption; Information transmitted may not be sufficient (e.g. poor resolution of images) to allow for appropriate medical decision making by the Practitioner; and/or  In rare instances, security protocols could fail, causing a breach of personal health information.  Furthermore, I acknowledge that it is my responsibility to provide information about my medical history, conditions and care that is complete and accurate to the best of my ability. I acknowledge that Practitioner's advice, recommendations, and/or decision may be based on factors not within their control, such as incomplete or inaccurate data provided by me or distortions of diagnostic images or specimens that may result from electronic transmissions. I understand that the practice of medicine is not an exact science and that Practitioner makes no warranties or guarantees regarding treatment outcomes. I acknowledge that a copy of this consent can be made available to me via my patient portal [facility] MyChart), or I can request a printed copy by calling the office of [facility].    I understand that my insurance will be billed for this visit.   I have read or had this consent read to me. I understand the contents of this consent, which adequately explains the benefits and risks of the Services being provided via telemedicine.  I have been provided ample opportunity to ask questions regarding this consent and the Services and have had my questions answered to my satisfaction. I give my informed consent for the services to be provided through the use of telemedicine in my medical care

## 2022-04-14 DIAGNOSIS — R350 Frequency of micturition: Secondary | ICD-10-CM | POA: Diagnosis not present

## 2022-04-14 DIAGNOSIS — R3915 Urgency of urination: Secondary | ICD-10-CM | POA: Diagnosis not present

## 2022-04-14 DIAGNOSIS — R351 Nocturia: Secondary | ICD-10-CM | POA: Diagnosis not present

## 2022-04-17 NOTE — Progress Notes (Signed)
Virtual Visit via Telephone Note   Because of Beth Christian co-morbid illnesses, she is at least at moderate risk for complications without adequate follow up.  This format is felt to be most appropriate for this patient at this time.  The patient did not have access to video technology/had technical difficulties with video requiring transitioning to audio format only (telephone).  All issues noted in this document were discussed and addressed.  No physical exam could be performed with this format.  Please refer to the patient's chart for her consent to telehealth for [facility].  Evaluation Performed:  Preoperative cardiovascular risk assessment _____________   Date:  04/17/2022   Patient ID:  Beth Christian, DOB 16-Sep-1950, MRN [id number] Patient Location:  Home Provider location:   Office  Primary Care Provider:  Kirby Funk, MD Primary Cardiologist:  [name], MD  Chief Complaint / Patient Profile   71 y.o. y/o female with a h/o CAD s/p DES to RCA 2012, procedure related embolic stroke, HTN, hyperlipidemia, moderate aortic valve insufficiency, and unstable gait who is pending right knee scope medial and lateral menisectomies and presents today for telephonic preoperative cardiovascular risk assessment.  Past Medical History    Past Medical History:  Diagnosis Date   Articular cartilage disorder involving shoulder region, left 12/2016   Coronary artery disease involving native coronary artery 2012   DES RCA x 2 (most recently for ISR)   CVA (cerebral infarction) 01/25/2011   during heart cath - no residual deficits   Depression    Grinding of teeth    History of CVA (cerebrovascular accident)    HTN (hypertension)    states under control with meds., has been on med. x 6 mos.   Hypothyroidism    Immature cataract of both eyes    Osteoarthritis of shoulder    left   Raynaud's disease    RLS (restless legs syndrome)    Rotator cuff tear, left  12/2016   TMJ dysfunction    Urinary, incontinence, stress female    Wears hearing aid in both ears    Past Surgical History:  Procedure Laterality Date   BREAST BIOPSY Right 09/15/2014   stromal hyalinization and small benign fibroadenoma   COLONOSCOPY WITH PROPOFOL N/A 12/02/2012   Procedure: COLONOSCOPY WITH PROPOFOL;  Surgeon: Charolett Bumpers, MD;  Location: [facility];  Service: Endoscopy;  Laterality: N/A;   CORONARY ANGIOPLASTY WITH STENT PLACEMENT  01/25/2011   proximal RCA   LEFT HEART CATHETERIZATION WITH CORONARY ANGIOGRAM N/A 07/07/2011   Procedure: LEFT HEART CATHETERIZATION WITH CORONARY ANGIOGRAM;  Surgeon: [name], MD;  Location: [facility];  Service: Cardiovascular;  Laterality: N/A;  Femoral   ORIF DISTAL RADIUS FRACTURE Right 06/11/2012   PERCUTANEOUS CORONARY STENT INTERVENTION (PCI-S) N/A 07/07/2011   Procedure: PERCUTANEOUS CORONARY STENT INTERVENTION (PCI-S);  Surgeon: [name], MD;  Location: [facility] C[facility] LAB;  Service: Cardiovascular;  Laterality: N/A;   SHOULDER CLOSED REDUCTION Left 01/11/2017   Procedure: CLOSED MANIPULATION SHOULDER;  Surgeon: Loreta Ave, MD;  Location: [facility];  Service: Orthopedics;  Laterality: Left;   TENDON REPAIR Left    thumb   TONSILLECTOMY     as child   WRIST SURGERY      Allergies  Allergies  Allergen Reactions   Latex Itching and Rash    History of Present Illness    Beth Christian is a 71 y.o. female who presents via  audio/video conferencing for a telehealth visit today.  Pt was last seen in cardiology clinic on 11/14/21 by Dr. Tamala Julian.  At that time Beth Christian was doing well.  The patient is now pending procedure as outlined above. Since her last visit, she  denies chest pain, shortness of breath, lower extremity edema, fatigue, palpitations, melena, hematuria, hemoptysis, diaphoresis, weakness, presyncope, syncope, orthopnea, and PND.  Home Medications    Prior to Admission  medications   Medication Sig Start Date End Date Taking? Authorizing Provider  Acetaminophen (TYLENOL PO) Take 500 mg by mouth as needed.    [provider]  amLODipine (NORVASC) 5 MG tablet Take 5 mg by mouth daily.    [provider]  aspirin EC 81 MG tablet Take 81 mg by mouth daily.    [provider]  azelastine (ASTELIN) 0.1 % nasal spray Place into both nostrils 2 (two) times daily. Use in each nostril as directed    [provider]  buPROPion (WELLBUTRIN XL) 150 MG 24 hr tablet Take 150 mg by mouth 3 (three) times daily.    [provider]  citalopram (CELEXA) 20 MG tablet Take 20 mg by mouth daily.    [provider]  clonazePAM (KLONOPIN) 1 MG tablet TAKE 1 TABLET BY MOUTH EVERYDAY AT BEDTIME 03/18/19   [provider]  donepezil (ARICEPT) 10 MG tablet Take 10 mg by mouth at bedtime.    [provider]  hydrochlorothiazide (HYDRODIURIL) 25 MG tablet TAKE 1 TABLET (25 MG TOTAL) BY MOUTH DAILY. PLEASE KEEP UPCOMING APPT WITH DR BEFORE ANYMORE REFILLS 11/14/21   Belva Crome, MD  levothyroxine (SYNTHROID, LEVOTHROID) 75 MCG tablet Take 75 mcg by mouth daily.    [provider]  Propylene Glycol-Glycerin (ARTIFICIAL TEARS) 1-0.3 % SOLN 4 drops in each eye    [provider]  rosuvastatin (CRESTOR) 10 MG tablet TAKE 1 TABLET (10 MG TOTAL) BY MOUTH AT BEDTIME. *REFILL 09/11/16* 09/04/17   Belva Crome, MD  UNABLE TO FIND Med Name: artificial tears    [provider]  VITAMIN D PO Take 5,000 Units by mouth.    [provider]    Physical Exam    Vital Signs:  MARESSA TUNGATE does not have vital signs available for review today.  Given telephonic nature of communication, physical exam is limited. AAOx3. NAD. Normal affect.  Speech and respirations are unlabored.  Accessory Clinical Findings    None  Assessment & Plan    1.  Preoperative Cardiovascular Risk Assessment: The patient  is doing well from a cardiac perspective. Therefore, based on ACC/AHA guidelines, the patient would be at acceptable risk for the planned procedure without further cardiovascular testing. The patient was advised that if he develops new symptoms prior to surgery to contact our office to arrange for a follow-up visit, and he verbalized understanding. According to the Revised Cardiac Risk Index (RCRI), her Perioperative Risk of Major Cardiac Event is (%): 0.9. Her Functional Capacity in METs is: 4.64 according to the Duke Activity Status Index (DASI).  She may hold aspirin for 7 days prior according to protocol and should resume as soon as hemodynamically stable.   A copy of this note will be routed to requesting surgeon.  Time:   Today, I have spent 8 minutes with the patient with telehealth technology discussing medical history, symptoms, and management plan.     Emmaline Life, NP-C    04/17/2022, 4:35 PM [facility] [address] Office [phone] Fax [phone]

## 2022-04-18 ENCOUNTER — Encounter: Payer: Self-pay | Admitting: Nurse Practitioner

## 2022-04-18 ENCOUNTER — Ambulatory Visit: Payer: Medicare PPO | Attending: Cardiovascular Disease | Admitting: Nurse Practitioner

## 2022-04-18 DIAGNOSIS — Z01810 Encounter for preprocedural cardiovascular examination: Secondary | ICD-10-CM | POA: Diagnosis not present

## 2022-04-19 DIAGNOSIS — Z0100 Encounter for examination of eyes and vision without abnormal findings: Secondary | ICD-10-CM | POA: Diagnosis not present

## 2022-04-19 DIAGNOSIS — H40003 Preglaucoma, unspecified, bilateral: Secondary | ICD-10-CM | POA: Diagnosis not present

## 2022-05-03 DIAGNOSIS — S83281A Other tear of lateral meniscus, current injury, right knee, initial encounter: Secondary | ICD-10-CM | POA: Diagnosis not present

## 2022-05-03 DIAGNOSIS — M1711 Unilateral primary osteoarthritis, right knee: Secondary | ICD-10-CM | POA: Diagnosis not present

## 2022-05-03 DIAGNOSIS — S83231A Complex tear of medial meniscus, current injury, right knee, initial encounter: Secondary | ICD-10-CM | POA: Diagnosis not present

## 2022-05-03 DIAGNOSIS — S83271A Complex tear of lateral meniscus, current injury, right knee, initial encounter: Secondary | ICD-10-CM | POA: Diagnosis not present

## 2022-05-03 DIAGNOSIS — G8918 Other acute postprocedural pain: Secondary | ICD-10-CM | POA: Diagnosis not present

## 2022-05-03 DIAGNOSIS — S83241A Other tear of medial meniscus, current injury, right knee, initial encounter: Secondary | ICD-10-CM | POA: Diagnosis not present

## 2022-05-08 DIAGNOSIS — M6281 Muscle weakness (generalized): Secondary | ICD-10-CM | POA: Diagnosis not present

## 2022-05-08 DIAGNOSIS — S83221D Peripheral tear of medial meniscus, current injury, right knee, subsequent encounter: Secondary | ICD-10-CM | POA: Diagnosis not present

## 2022-05-08 DIAGNOSIS — M25661 Stiffness of right knee, not elsewhere classified: Secondary | ICD-10-CM | POA: Diagnosis not present

## 2022-05-08 DIAGNOSIS — S83261D Peripheral tear of lateral meniscus, current injury, right knee, subsequent encounter: Secondary | ICD-10-CM | POA: Diagnosis not present

## 2022-05-08 DIAGNOSIS — R269 Unspecified abnormalities of gait and mobility: Secondary | ICD-10-CM | POA: Diagnosis not present

## 2022-05-08 DIAGNOSIS — M94261 Chondromalacia, right knee: Secondary | ICD-10-CM | POA: Diagnosis not present

## 2022-05-17 DIAGNOSIS — S83261D Peripheral tear of lateral meniscus, current injury, right knee, subsequent encounter: Secondary | ICD-10-CM | POA: Diagnosis not present

## 2022-05-17 DIAGNOSIS — M6281 Muscle weakness (generalized): Secondary | ICD-10-CM | POA: Diagnosis not present

## 2022-05-17 DIAGNOSIS — M94261 Chondromalacia, right knee: Secondary | ICD-10-CM | POA: Diagnosis not present

## 2022-05-17 DIAGNOSIS — S83221D Peripheral tear of medial meniscus, current injury, right knee, subsequent encounter: Secondary | ICD-10-CM | POA: Diagnosis not present

## 2022-05-17 DIAGNOSIS — R269 Unspecified abnormalities of gait and mobility: Secondary | ICD-10-CM | POA: Diagnosis not present

## 2022-05-17 DIAGNOSIS — M25661 Stiffness of right knee, not elsewhere classified: Secondary | ICD-10-CM | POA: Diagnosis not present

## 2022-05-22 DIAGNOSIS — R269 Unspecified abnormalities of gait and mobility: Secondary | ICD-10-CM | POA: Diagnosis not present

## 2022-05-22 DIAGNOSIS — M25661 Stiffness of right knee, not elsewhere classified: Secondary | ICD-10-CM | POA: Diagnosis not present

## 2022-05-22 DIAGNOSIS — S83221D Peripheral tear of medial meniscus, current injury, right knee, subsequent encounter: Secondary | ICD-10-CM | POA: Diagnosis not present

## 2022-05-22 DIAGNOSIS — M94261 Chondromalacia, right knee: Secondary | ICD-10-CM | POA: Diagnosis not present

## 2022-05-22 DIAGNOSIS — M6281 Muscle weakness (generalized): Secondary | ICD-10-CM | POA: Diagnosis not present

## 2022-05-22 DIAGNOSIS — S83261D Peripheral tear of lateral meniscus, current injury, right knee, subsequent encounter: Secondary | ICD-10-CM | POA: Diagnosis not present

## 2022-05-26 DIAGNOSIS — M25661 Stiffness of right knee, not elsewhere classified: Secondary | ICD-10-CM | POA: Diagnosis not present

## 2022-05-26 DIAGNOSIS — M6281 Muscle weakness (generalized): Secondary | ICD-10-CM | POA: Diagnosis not present

## 2022-05-26 DIAGNOSIS — M94261 Chondromalacia, right knee: Secondary | ICD-10-CM | POA: Diagnosis not present

## 2022-05-26 DIAGNOSIS — S83261D Peripheral tear of lateral meniscus, current injury, right knee, subsequent encounter: Secondary | ICD-10-CM | POA: Diagnosis not present

## 2022-05-26 DIAGNOSIS — S83221D Peripheral tear of medial meniscus, current injury, right knee, subsequent encounter: Secondary | ICD-10-CM | POA: Diagnosis not present

## 2022-05-26 DIAGNOSIS — R269 Unspecified abnormalities of gait and mobility: Secondary | ICD-10-CM | POA: Diagnosis not present

## 2022-05-29 ENCOUNTER — Telehealth: Payer: Self-pay

## 2022-05-29 ENCOUNTER — Ambulatory Visit (INDEPENDENT_AMBULATORY_CARE_PROVIDER_SITE_OTHER): Payer: Medicare PPO

## 2022-05-29 VITALS — BP 131/56 | HR 66 | Temp 97.7°F | Resp 16 | Ht 63.0 in | Wt 156.3 lb

## 2022-05-29 DIAGNOSIS — R399 Unspecified symptoms and signs involving the genitourinary system: Secondary | ICD-10-CM

## 2022-05-29 LAB — POCT URINALYSIS DIPSTICK
Bilirubin, UA: NEGATIVE
Glucose, UA: NEGATIVE
Ketones, UA: NEGATIVE
Nitrite, UA: NEGATIVE
Protein, UA: POSITIVE — AB
Spec Grav, UA: 1.01 (ref 1.010–1.025)
Urobilinogen, UA: 0.2 E.U./dL
pH, UA: 7.5 (ref 5.0–8.0)

## 2022-05-29 MED ORDER — NITROFURANTOIN MONOHYD MACRO 100 MG PO CAPS: 100.0000 mg | ORAL_CAPSULE | Freq: Two times a day (BID) | ORAL | 0 refills | Status: DC

## 2022-05-29 NOTE — Telephone Encounter (Signed)
Pt called our office to schedule appt for UTI sx. She is having urinary frequency and burning. She is scheduled on nurse visit for today.

## 2022-05-29 NOTE — Progress Notes (Signed)
    GYNECOLOGY PROGRESS NOTE  Subjective:    Patient ID: Beth Christian, female    DOB: [birth year], 71 y.o.   MRN: [id number]  HPI  Patient is a 71 y.o. [id number] female who presents for possible UTI. Patient has had dysuria, urinary frequency and urgency x 2 days. She said it started on Saturday and it was on and off. Today she reports that symptoms are constant. She has not tried to take anything otc to treat symptoms. I sent in some Macrobid 100 mg. Advised patient if she is having any pain to treat with Tylenol. Urine was sent out to be cultured.

## 2022-05-31 DIAGNOSIS — S83221D Peripheral tear of medial meniscus, current injury, right knee, subsequent encounter: Secondary | ICD-10-CM | POA: Diagnosis not present

## 2022-05-31 DIAGNOSIS — M25661 Stiffness of right knee, not elsewhere classified: Secondary | ICD-10-CM | POA: Diagnosis not present

## 2022-05-31 DIAGNOSIS — S83261D Peripheral tear of lateral meniscus, current injury, right knee, subsequent encounter: Secondary | ICD-10-CM | POA: Diagnosis not present

## 2022-05-31 DIAGNOSIS — R269 Unspecified abnormalities of gait and mobility: Secondary | ICD-10-CM | POA: Diagnosis not present

## 2022-05-31 DIAGNOSIS — M6281 Muscle weakness (generalized): Secondary | ICD-10-CM | POA: Diagnosis not present

## 2022-05-31 DIAGNOSIS — M94261 Chondromalacia, right knee: Secondary | ICD-10-CM | POA: Diagnosis not present

## 2022-05-31 LAB — URINE CULTURE

## 2022-06-01 NOTE — Progress Notes (Signed)
Ok thanks 

## 2022-06-07 DIAGNOSIS — M94261 Chondromalacia, right knee: Secondary | ICD-10-CM | POA: Diagnosis not present

## 2022-06-07 DIAGNOSIS — M6281 Muscle weakness (generalized): Secondary | ICD-10-CM | POA: Diagnosis not present

## 2022-06-07 DIAGNOSIS — M25661 Stiffness of right knee, not elsewhere classified: Secondary | ICD-10-CM | POA: Diagnosis not present

## 2022-06-07 DIAGNOSIS — R269 Unspecified abnormalities of gait and mobility: Secondary | ICD-10-CM | POA: Diagnosis not present

## 2022-06-07 DIAGNOSIS — S83221D Peripheral tear of medial meniscus, current injury, right knee, subsequent encounter: Secondary | ICD-10-CM | POA: Diagnosis not present

## 2022-06-07 DIAGNOSIS — S83261D Peripheral tear of lateral meniscus, current injury, right knee, subsequent encounter: Secondary | ICD-10-CM | POA: Diagnosis not present

## 2022-06-26 ENCOUNTER — Other Ambulatory Visit: Payer: Self-pay | Admitting: Physician Assistant

## 2022-06-26 ENCOUNTER — Ambulatory Visit
Admission: RE | Admit: 2022-06-26 | Discharge: 2022-06-26 | Disposition: A | Discharge status: Home / Self Care | Payer: Medicare PPO | Source: Ambulatory Visit | Attending: Physician Assistant | Admitting: Physician Assistant

## 2022-06-26 DIAGNOSIS — J929 Pleural plaque without asbestos: Secondary | ICD-10-CM | POA: Diagnosis not present

## 2022-06-26 DIAGNOSIS — R042 Hemoptysis: Secondary | ICD-10-CM | POA: Diagnosis not present

## 2022-06-26 DIAGNOSIS — K648 Other hemorrhoids: Secondary | ICD-10-CM | POA: Diagnosis not present

## 2022-06-26 DIAGNOSIS — J9811 Atelectasis: Secondary | ICD-10-CM | POA: Diagnosis not present

## 2022-06-26 DIAGNOSIS — N907 Vulvar cyst: Secondary | ICD-10-CM | POA: Diagnosis not present

## 2022-06-26 DIAGNOSIS — S2241XA Multiple fractures of ribs, right side, initial encounter for closed fracture: Secondary | ICD-10-CM | POA: Diagnosis not present

## 2022-07-10 DIAGNOSIS — M25571 Pain in right ankle and joints of right foot: Secondary | ICD-10-CM | POA: Diagnosis not present

## 2022-07-10 DIAGNOSIS — S92354A Nondisplaced fracture of fifth metatarsal bone, right foot, initial encounter for closed fracture: Secondary | ICD-10-CM | POA: Diagnosis not present

## 2022-07-12 ENCOUNTER — Encounter: Payer: Self-pay | Admitting: Advanced Practice Midwife

## 2022-07-12 ENCOUNTER — Ambulatory Visit (INDEPENDENT_AMBULATORY_CARE_PROVIDER_SITE_OTHER): Payer: Medicare PPO | Admitting: Advanced Practice Midwife

## 2022-07-12 VITALS — BP 140/80 | Ht 63.0 in | Wt 159.0 lb

## 2022-07-12 DIAGNOSIS — N9089 Other specified noninflammatory disorders of vulva and perineum: Secondary | ICD-10-CM

## 2022-07-12 NOTE — Progress Notes (Signed)
Patient ID: Beth Christian, female   DOB: [birth year], 71 y.o.   MRN: [id number]  Reason for Consult: vaginal mass (Size of a pea, white, was there for one day and gone today. No itching )    Subjective:  HPI:  Beth Christian is a 71 y.o. female being seen for evaluation of a labial bump. She first noticed the bump several months ago- to the best of her recollection. She describes it as hard and white and mildly tender to touch. She saw her PCP about it and it was diagnosed as a cyst with no treatment needed unless it was bothering her. She reports that she no longer feels the bump except for a very small remnant. She is not sexually active although her husband has expressed the need for it to be removed due to the proximity to her vagina. On exam a black head is seen on the lower left labia and just distal to that is a slight red discoloration of the skin where the cyst likely was. The black head (clogged hair follicle) was removed without difficulty or discomfort. The site of the resolved cyst is non-tender- no bump appreciated. Reassurance given that there is no need for further treatment at this time. Follow up with MD/dermatology if cyst returns.  Her last PAP done on 05/24/2021 was ASCUS/-HR HPV with recommendation from [facility] for repeat PAP in 3 years.  Past Medical History:  Diagnosis Date   Articular cartilage disorder involving shoulder region, left 12/2016   Coronary artery disease involving native coronary artery 2012   DES RCA x 2 (most recently for ISR)   CVA (cerebral infarction) 01/25/2011   during heart cath - no residual deficits   Depression    Grinding of teeth    History of CVA (cerebrovascular accident)    HTN (hypertension)    states under control with meds., has been on med. x 6 mos.   Hypothyroidism    Immature cataract of both eyes    Osteoarthritis of shoulder    left   Raynaud's disease    RLS (restless legs syndrome)    Rotator cuff tear, left 12/2016    TMJ dysfunction    Urinary, incontinence, stress female    Wears hearing aid in both ears    Family History  Problem Relation Age of Onset   Hypertension Mother    Breast cancer Mother 21   Lymphoma Mother    Alzheimer's disease Mother    Dementia Mother    Kidney failure Father    Coronary artery disease Father    Throat cancer Sister 71   Hypertension Sister    Ovarian cancer Paternal Aunt        ? ovar vs cx vs uterine   Melanoma Paternal Aunt    Melanoma Other    Past Surgical History:  Procedure Laterality Date   BREAST BIOPSY Right 09/15/2014   stromal hyalinization and small benign fibroadenoma   COLONOSCOPY WITH PROPOFOL N/A 12/02/2012   Procedure: COLONOSCOPY WITH PROPOFOL;  Surgeon: Charolett Bumpers, MD;  Location: [facility];  Service: Endoscopy;  Laterality: N/A;   CORONARY ANGIOPLASTY WITH STENT PLACEMENT  01/25/2011   proximal RCA   LEFT HEART CATHETERIZATION WITH CORONARY ANGIOGRAM N/A 07/07/2011   Procedure: LEFT HEART CATHETERIZATION WITH CORONARY ANGIOGRAM;  Surgeon: [name], MD;  Location: [facility] CATH LAB;  Service: Cardiovascular;  Laterality: N/A;  Femoral   ORIF DISTAL RADIUS FRACTURE Right 06/11/2012   PERCUTANEOUS CORONARY  STENT INTERVENTION (PCI-S) N/A 07/07/2011   Procedure: PERCUTANEOUS CORONARY STENT INTERVENTION (PCI-S);  Surgeon: [name], MD;  Location: [facility] CATH LAB;  Service: Cardiovascular;  Laterality: N/A;   SHOULDER CLOSED REDUCTION Left 01/11/2017   Procedure: CLOSED MANIPULATION SHOULDER;  Surgeon: Loreta Ave, MD;  Location: [facility];  Service: Orthopedics;  Laterality: Left;   TENDON REPAIR Left    thumb   TONSILLECTOMY     as child   WRIST SURGERY      Short Social History:  Social History   Tobacco Use   Smoking status: Former    Types: Cigarettes    Quit date: 07/07/1979    Years since quitting: 43.0   Smokeless tobacco: Never  Substance Use Topics   Alcohol use: Yes    Comment:  occasionally    Allergies  Allergen Reactions   Latex Itching and Rash    Current Outpatient Medications  Medication Sig Dispense Refill   Acetaminophen (TYLENOL PO) Take 500 mg by mouth as needed.     amLODipine (NORVASC) 5 MG tablet Take 5 mg by mouth daily.     aspirin EC 81 MG tablet Take 81 mg by mouth daily.     azelastine (ASTELIN) 0.1 % nasal spray Place into both nostrils 2 (two) times daily. Use in each nostril as directed     buPROPion (WELLBUTRIN XL) 150 MG 24 hr tablet Take 150 mg by mouth 3 (three) times daily.     celecoxib (CELEBREX) 200 MG capsule Take 200 mg by mouth daily as needed.     citalopram (CELEXA) 20 MG tablet Take 20 mg by mouth daily.     clonazePAM (KLONOPIN) 0.5 MG tablet Take 0.5 mg by mouth at bedtime.     donepezil (ARICEPT) 10 MG tablet Take 10 mg by mouth at bedtime.     hydrochlorothiazide (HYDRODIURIL) 25 MG tablet TAKE 1 TABLET (25 MG TOTAL) BY MOUTH DAILY. PLEASE KEEP UPCOMING APPT WITH DR BEFORE ANYMORE REFILLS 90 tablet 3   levothyroxine (SYNTHROID, LEVOTHROID) 75 MCG tablet Take 75 mcg by mouth daily.     MYRBETRIQ 50 MG TB24 tablet Take 50 mg by mouth daily.     Propylene Glycol-Glycerin (ARTIFICIAL TEARS) 1-0.3 % SOLN 4 drops in each eye     rosuvastatin (CRESTOR) 10 MG tablet TAKE 1 TABLET (10 MG TOTAL) BY MOUTH AT BEDTIME. *REFILL 09/11/16* 30 tablet 10   UNABLE TO FIND Med Name: artificial tears     VITAMIN D PO Take 5,000 Units by mouth.     clonazePAM (KLONOPIN) 1 MG tablet TAKE 1 TABLET BY MOUTH EVERYDAY AT BEDTIME (Patient not taking: Reported on 07/12/2022)     nitrofurantoin, macrocrystal-monohydrate, (MACROBID) 100 MG capsule Take 1 capsule (100 mg total) by mouth 2 (two) times daily. (Patient not taking: Reported on 07/12/2022) 14 capsule 0   No current facility-administered medications for this visit.    Review of Systems  Constitutional:  Negative for chills and fever.  HENT:  Negative for congestion, ear discharge, ear pain,  hearing loss, sinus pain and sore throat.   Eyes:  Negative for blurred vision and double vision.  Respiratory:  Negative for cough, shortness of breath and wheezing.   Cardiovascular:  Negative for chest pain, palpitations and leg swelling.  Gastrointestinal:  Negative for abdominal pain, blood in stool, constipation, diarrhea, heartburn, melena, nausea and vomiting.  Genitourinary:  Negative for dysuria, flank pain, frequency, hematuria and urgency.  Positive for labial lesion  Musculoskeletal:  Negative for back pain, joint pain and myalgias.  Skin:  Negative for itching and rash.  Neurological:  Negative for dizziness, tingling, tremors, sensory change, speech change, focal weakness, seizures, loss of consciousness, weakness and headaches.  Endo/Heme/Allergies:  Negative for environmental allergies. Does not bruise/bleed easily.  Psychiatric/Behavioral:  Negative for depression, hallucinations, memory loss, substance abuse and suicidal ideas. The patient is not nervous/anxious and does not have insomnia.         Objective:  Objective   Vitals:   07/12/22 1013  BP: (!) 140/80  Weight: 159 lb (72.1 kg)  Height: 5\' 3"  (1.6 m)   Body mass index is 28.17 kg/m. Constitutional: Well nourished, well developed female in no acute distress.  HEENT: normal Skin: Warm and dry.  Respiratory:  Normal respiratory effort Psych: Alert and Oriented x3. No memory deficits. Normal mood and affect.  MS: some difficulty with mobility due to broken bone/right foot in boot  Pelvic exam:  is not limited by body habitus EGBUS: Black head noted lower left labia majora, slightly red circular discoloration just distal to black head. Black head easily manually expressed. Site of cyst is non tender, flat, no superficial bump palpated.    Assessment/Plan:     71 y.o. postmenopausal female with resolved left labial cyst  Follow up as needed with MD or dermatology for recurrence of cyst Follow up  PAP smear is due in 2025 for ASCUS PAP in 2022   2023 CNM [facility] 07/12/2022, 11:40 AM

## 2022-07-13 ENCOUNTER — Ambulatory Visit: Payer: Medicare PPO | Admitting: Pulmonary Disease

## 2022-07-13 ENCOUNTER — Encounter: Payer: Self-pay | Admitting: Pulmonary Disease

## 2022-07-13 VITALS — BP 122/62 | HR 72 | Ht 63.0 in | Wt 159.0 lb

## 2022-07-13 DIAGNOSIS — R042 Hemoptysis: Secondary | ICD-10-CM | POA: Diagnosis not present

## 2022-07-13 NOTE — Progress Notes (Signed)
@Patient  ID: , female    DOB: 10-Dec-1950, 71 y.o.   MRN: [id number]  Chief Complaint  Patient presents with   Consult    Pt is here for consult for hemoptysis. Pt states she is no longer coughing up blood. She states this has only occurred twice and it only lasted 1 day both times and went away after that. Chest xray 06/26/2022    Referring provider: 06/28/2022, PA  HPI:   71 y.o. woman whom we are seeing in consultation for evaluation of hemoptysis.  Most recent cardiology note reviewed.  PCP note reviewed.  Patient was in usual state of health.  In the evening preceding the event, and the night, she felt congestion in her chest.  The next morning, she coughed up frank blood.  Filled the tissue.  Again, frank blood.  No sputum.  Couple other episodes throughout the day then became mixed with clearish sputum.  The next day in the morning again continue the clear less severe, more faint.  Was gone by the next day.  No preceding illness.  No preceding cough.  No viral illness, bronchitis.  No nasal congestion runny nose sinus congestion.  She had a similar incident about 2 years prior.  But this is in the setting of what she describes as a severe sinus illness.  Self resolved after a day or 2.  Chest x-ray 06/2022, 4 days after event, my review and interpretation shows mild hyperinflation, linear density right base laterally similar in appearance of 2019 chest x-ray on my review and interpretation, possible rib shadow, versus linear atelectasis, versus mild bronchiectatic change, versus other parenchymal abnormality.  No further incident.  No further cough.  No illness.  No shortness of breath.  Former smoker, quit about 1980.  Most recent TTE 05/2015 revealed LVH, normal EF, grade 1 diastolic dysfunction, mild MVR, mild to moderate AI, normal RV size and function.   Questionaires / Pulmonary Flowsheets:   ACT:      No data to display          MMRC:     No  data to display          Epworth:      No data to display          Tests:   FENO:  No results found for: "NITRICOXIDE"  PFT:     No data to display          WALK:      No data to display          Imaging: \Personally reviewed and as per EMR and discussion in this note DG Chest 2 View  Result Date: 06/28/2022 CLINICAL DATA:  Hemoptysis. EXAM: CHEST - 2 VIEW COMPARISON:  Chest x-ray rib series 06/18/2018. Chest x-ray 11/24/2011. Chest CT 06/08/2005 FINDINGS: Mediastinum and hilar structures normal. Heart size normal. Coronary artery calcification cannot be excluded. Aortic atherosclerotic vascular calcification. Medial right base atelectasis and or scarring again noted. No pleural effusion or pneumothorax. Biapical pleural thickening consistent with scarring. Old right rib fractures again noted. Degenerative change thoracic spine. IMPRESSION: 1. Heart size normal. Coronary artery calcification cannot be excluded. Aortic atherosclerotic vascular disease. 2. Medial right base atelectasis and or scarring again noted. Electronically Signed   By: 06/10/2005  Register M.D.   On: 06/28/2022 09:03    Lab Results: Personally reviewed CBC    Component Value Date/Time   WBC 6.3 07/08/2011 0530   RBC 4.20 07/08/2011  0530   HGB 13.3 01/11/2017 0745   HCT 39.0 01/11/2017 0745   PLT 229 07/08/2011 0530   MCV 90.5 07/08/2011 0530   MCH 31.4 07/08/2011 0530   MCHC 34.7 07/08/2011 0530   RDW 12.2 07/08/2011 0530   LYMPHSABS 1.8 07/04/2011 1422   MONOABS 0.3 07/04/2011 1422   EOSABS 0.0 07/04/2011 1422   BASOSABS 0.0 07/04/2011 1422    BMET    Component Value Date/Time   NA 142 01/11/2017 0745   K 3.3 (L) 01/11/2017 0745   CL 99 (L) 01/11/2017 0745   CO2 31 01/05/2017 1456   GLUCOSE 76 01/11/2017 0745   BUN 19 01/11/2017 0745   CREATININE 1.00 01/11/2017 0745   CREATININE 1.07 (H) 09/22/2015 0843   CALCIUM 9.0 01/05/2017 1456   GFRNONAA 58 (L) 01/05/2017 1456   GFRAA  >60 01/05/2017 1456    BNP No results found for: "BNP"  ProBNP No results found for: "PROBNP"  Specialty Problems       Pulmonary Problems   Dyspnea on exertion    Allergies  Allergen Reactions   Latex Itching and Rash    Immunization History  Administered Date(s) Administered   Influenza-Unspecified 05/31/2022   PFIZER(Purple Top)SARS-COV-2 Vaccination 09/03/2019, 09/22/2019, 06/14/2022   Tdap 06/01/2015    Past Medical History:  Diagnosis Date   Articular cartilage disorder involving shoulder region, left 12/2016   Coronary artery disease involving native coronary artery 2012   DES RCA x 2 (most recently for ISR)   CVA (cerebral infarction) 01/25/2011   during heart cath - no residual deficits   Depression    Grinding of teeth    History of CVA (cerebrovascular accident)    HTN (hypertension)    states under control with meds., has been on med. x 6 mos.   Hypothyroidism    Immature cataract of both eyes    Osteoarthritis of shoulder    left   Raynaud's disease    RLS (restless legs syndrome)    Rotator cuff tear, left 12/2016   TMJ dysfunction    Urinary, incontinence, stress female    Wears hearing aid in both ears     Tobacco History: Social History   Tobacco Use  Smoking Status Former   Packs/day: 1.00   Types: Cigarettes   Quit date: 07/07/1979   Years since quitting: 43.0  Smokeless Tobacco Never   Counseling given: Not Answered   Continue to not smoke  Outpatient Encounter Medications as of 07/13/2022  Medication Sig   Acetaminophen (TYLENOL PO) Take 500 mg by mouth as needed.   amLODipine (NORVASC) 5 MG tablet Take 5 mg by mouth daily.   aspirin EC 81 MG tablet Take 81 mg by mouth daily.   azelastine (ASTELIN) 0.1 % nasal spray Place into both nostrils 2 (two) times daily. Use in each nostril as directed   buPROPion (WELLBUTRIN XL) 150 MG 24 hr tablet Take 150 mg by mouth 3 (three) times daily.   celecoxib (CELEBREX) 200 MG capsule  Take 200 mg by mouth daily as needed.   citalopram (CELEXA) 20 MG tablet Take 20 mg by mouth daily.   clonazePAM (KLONOPIN) 0.5 MG tablet Take 0.5 mg by mouth at bedtime.   clonazePAM (KLONOPIN) 1 MG tablet    donepezil (ARICEPT) 10 MG tablet Take 10 mg by mouth at bedtime.   hydrochlorothiazide (HYDRODIURIL) 25 MG tablet TAKE 1 TABLET (25 MG TOTAL) BY MOUTH DAILY. PLEASE KEEP UPCOMING APPT WITH DR BEFORE ANYMORE REFILLS  levothyroxine (SYNTHROID, LEVOTHROID) 75 MCG tablet Take 75 mcg by mouth daily.   MYRBETRIQ 50 MG TB24 tablet Take 50 mg by mouth daily.   nitrofurantoin, macrocrystal-monohydrate, (MACROBID) 100 MG capsule Take 1 capsule (100 mg total) by mouth 2 (two) times daily.   Propylene Glycol-Glycerin (ARTIFICIAL TEARS) 1-0.3 % SOLN 4 drops in each eye   rosuvastatin (CRESTOR) 10 MG tablet TAKE 1 TABLET (10 MG TOTAL) BY MOUTH AT BEDTIME. *REFILL 09/11/16*   UNABLE TO FIND Med Name: artificial tears   VITAMIN D PO Take 5,000 Units by mouth.   No facility-administered encounter medications on file as of 07/13/2022.     Review of Systems  Review of Systems  No chest pain with exertion.  No orthopnea or PND.  No weight loss. Comprehensive review of systems otherwise negative. Physical Exam  BP 122/62 (BP Location: Left Arm, Patient Position: Sitting, Cuff Size: Normal)   Pulse 72   Ht 5\' 3"  (1.6 m)   Wt 159 lb (72.1 kg)   SpO2 95%   BMI 28.17 kg/m   Wt Readings from Last 5 Encounters:  07/13/22 159 lb (72.1 kg)  07/12/22 159 lb (72.1 kg)  05/29/22 156 lb 4.8 oz (70.9 kg)  11/14/21 161 lb (73 kg)  05/24/21 156 lb (70.8 kg)    BMI Readings from Last 5 Encounters:  07/13/22 28.17 kg/m  07/12/22 28.17 kg/m  05/29/22 27.69 kg/m  11/14/21 28.52 kg/m  05/24/21 27.63 kg/m     Physical Exam General: Sitting in chair, no acute distress Eyes: EOMI, no icterus Neck: Supple, no JVP Cardiovascular: Warm, no edema Pulmonary: Clear, normal work of breathing Abdomen:  No significant masses. MSK: No synovitis, no joint effusion Neuro: Normal gait, no weakness Psych: Normal mood, full affect   Assessment & Plan:   Hemoptysis: 2 incidents over the last 2 years.  Most recently 06/24/2022 with frank hemoptysis that decreased throughout the day and resolve within 48 hours.  No preceding cough or illness.  Some congestion in the night likely was the inciting event or bursting of blood vessel.  Chest x-ray relatively clear, chronic linear infiltrate on the right that could be rib shadow were sequela of prior rib fracture versus sequela of prior lung collapse after fall.  Or its possible she has mild bronchiectasis which would predispose her to weakness in the blood vessel wall and spontaneous conversion.  Has not recurred in the last 2 weeks.  No cough or other illness.  CT chest without contrast to further delineate any parenchymal abnormality or presence of bronchiectasis that could contribute.   Return if symptoms worsen or fail to improve.   13/06/2022, MD 07/13/2022   This appointment required 61 minutes of patient care (this includes precharting, chart review, review of results, face-to-face care, etc.).

## 2022-07-13 NOTE — Patient Instructions (Addendum)
Nice to meet you  I ordered a CT scan of the chest to further evaluate the hemoptysis.  I do wonder about a condition called bronchiectasis.  This is stretching or dilation of the air tubes.  This does predispose people to coughing up a little blood from time to time.  There are some subtle findings on your chest x-ray that may be related to this.  Or could be related to your fall and collapse along you had in the past.  Return to clinic as needed based on the results of the CT scan.

## 2022-07-20 ENCOUNTER — Telehealth: Payer: Self-pay | Admitting: Interventional Cardiology

## 2022-07-20 ENCOUNTER — Ambulatory Visit
Admission: RE | Admit: 2022-07-20 | Discharge: 2022-07-20 | Disposition: A | Discharge status: Home / Self Care | Payer: Medicare PPO | Source: Ambulatory Visit | Attending: Internal Medicine | Admitting: Internal Medicine

## 2022-07-20 DIAGNOSIS — R918 Other nonspecific abnormal finding of lung field: Secondary | ICD-10-CM | POA: Diagnosis not present

## 2022-07-20 DIAGNOSIS — J479 Bronchiectasis, uncomplicated: Secondary | ICD-10-CM | POA: Diagnosis not present

## 2022-07-20 DIAGNOSIS — R042 Hemoptysis: Secondary | ICD-10-CM | POA: Insufficient documentation

## 2022-07-20 NOTE — Telephone Encounter (Signed)
Returned call to patient's spouse, Ezrah Dembeck, [place] per DPR.  Informed Tresa Endo of Dr. Michaelle Copas recommendations: Dr. Donato Schultz, Dr. Laurance Flatten and Dr. Riley Lam.  Tresa Endo wanted to schedule patient for 1 year OV F/U due for March 2024 with Dr. Laurance Flatten. Offered appt for 11/03/22 at 10:40am, Tresa Endo accepted.  Kelly expressed appreciation for assistance.

## 2022-07-20 NOTE — Telephone Encounter (Signed)
Calling to see what other Dr would Dr. Katrinka Blazing recommend the patient to see. Please advise

## 2022-07-21 DIAGNOSIS — N3941 Urge incontinence: Secondary | ICD-10-CM | POA: Diagnosis not present

## 2022-07-21 DIAGNOSIS — R351 Nocturia: Secondary | ICD-10-CM | POA: Diagnosis not present

## 2022-07-21 DIAGNOSIS — R350 Frequency of micturition: Secondary | ICD-10-CM | POA: Diagnosis not present

## 2022-08-03 DIAGNOSIS — L820 Inflamed seborrheic keratosis: Secondary | ICD-10-CM | POA: Diagnosis not present

## 2022-08-03 DIAGNOSIS — L821 Other seborrheic keratosis: Secondary | ICD-10-CM | POA: Diagnosis not present

## 2022-09-26 ENCOUNTER — Other Ambulatory Visit: Payer: Self-pay

## 2022-09-26 MED ORDER — HYDROCHLOROTHIAZIDE 25 MG PO TABS: ORAL_TABLET | ORAL | 2 refills | Status: DC

## 2022-11-03 ENCOUNTER — Ambulatory Visit: Payer: Medicare PPO | Admitting: Cardiology

## 2022-11-09 ENCOUNTER — Other Ambulatory Visit: Payer: Self-pay | Admitting: Orthopedic Surgery

## 2022-11-09 DIAGNOSIS — S92353D Displaced fracture of fifth metatarsal bone, unspecified foot, subsequent encounter for fracture with routine healing: Secondary | ICD-10-CM

## 2022-11-15 ENCOUNTER — Other Ambulatory Visit: Payer: Medicare PPO

## 2022-11-15 ENCOUNTER — Ambulatory Visit
Admission: RE | Admit: 2022-11-15 | Discharge: 2022-11-15 | Disposition: A | Discharge status: Home / Self Care | Payer: Medicare PPO | Source: Ambulatory Visit | Attending: Orthopedic Surgery | Admitting: Orthopedic Surgery

## 2022-11-15 DIAGNOSIS — S92353D Displaced fracture of fifth metatarsal bone, unspecified foot, subsequent encounter for fracture with routine healing: Secondary | ICD-10-CM

## 2022-11-15 DIAGNOSIS — S92351K Displaced fracture of fifth metatarsal bone, right foot, subsequent encounter for fracture with nonunion: Secondary | ICD-10-CM | POA: Diagnosis not present

## 2022-11-21 DIAGNOSIS — S92354D Nondisplaced fracture of fifth metatarsal bone, right foot, subsequent encounter for fracture with routine healing: Secondary | ICD-10-CM | POA: Diagnosis not present

## 2022-12-13 NOTE — Progress Notes (Signed)
Cardiology Office Note:   Date:  12/15/2022  ID:  ALLENA Christian, DOB January 13, 1951, MRN [id number]  History of Present Illness:   Beth Christian is a 72 y.o. female with history of CAD s/p DES to RCA in 2021 complicated by procedure related embolic stroke, HTN, HLD, moderate AR who was previously followed by Dr. Katrinka Blazing who now returns to clinic for follow-up.  Was last seen by Eligha Bridegroom in 04/2022 for pre-op evaluation. Was doing well from a CV standpoint at that time.  Today, the patient feels well. No chest pain, SOB, LE edema, orthopnea or PND. Tolerating medications as prescribed. Blood pressure is well controlled and at goal. Admits to not being as active as she used to be as she is off balance.   Past Medical History:  Diagnosis Date   Articular cartilage disorder involving shoulder region, left 12/2016   Coronary artery disease involving native coronary artery 2012   DES RCA x 2 (most recently for ISR)   CVA (cerebral infarction) 01/25/2011   during heart cath - no residual deficits   Depression    Grinding of teeth    History of CVA (cerebrovascular accident)    HTN (hypertension)    states under control with meds., has been on med. x 6 mos.   Hypothyroidism    Immature cataract of both eyes    Osteoarthritis of shoulder    left   Raynaud's disease    RLS (restless legs syndrome)    Rotator cuff tear, left 12/2016   TMJ dysfunction    Urinary, incontinence, stress female    Wears hearing aid in both ears      ROS: As per HPI  Studies Reviewed:    EKG:  NSR, HR 68-personally reviewed  Cardiac Studies & Procedures     STRESS TESTS  NM MYOCAR MULTI W/SPECT W 07/05/2011  Narrative *RADIOLOGY REPORT*  Clinical Data:  72 year old with chest pain.  MYOCARDIAL IMAGING WITH SPECT (REST AND PHARMACOLOGIC-STRESS) GATED LEFT VENTRICULAR WALL MOTION STUDY LEFT VENTRICULAR EJECTION FRACTION  Technique:  Standard myocardial SPECT imaging was performed  after resting intravenous injection of 10 mCi Tc-95m tetrofosmin. Subsequently, intravenous infusion of regadenoson was performed under the supervision of the Cardiology staff.  At peak effect of the drug, 30 mCi Tc-55m tetrofosmin was injected intravenously and standard myocardial SPECT  imaging was performed.  Quantitative gated imaging was also performed to evaluate left ventricular wall motion, and estimate left ventricular ejection fraction.  Comparison:  None.  Findings: There is decreased myocardial uptake along the mid segment of the inferior and inferior septal wall on the stress images.  Findings are compatible with a reversible defect.  There is normal wall motion in the left ventricle.  No other significant fixed or reversible defects.  The end-diastolic volume is 52 ml and end-systolic volume is 17 ml.  Calculated ejection fraction is 68%.  IMPRESSION: There is a reversible defect involving the mid segment of the inferior and inferior septal wall.  Findings are concerning for an area of pharmacologically induced ischemia.  Normal left ventricular wall motion with calculated ejection fraction of 68%.  Original Report Authenticated By: Richarda Overlie, M.D.   ECHOCARDIOGRAM  ECHOCARDIOGRAM COMPLETE 06/14/2015  Narrative *[facility]* [address] [place], [place] [address] [phone]  ------------------------------------------------------------------- Transthoracic Echocardiography  Patient:    Beth, Christian MR #:       [id number] Study Date: 06/14/2015 Gender:     F Age:  63 Height:     154.9 cm Weight:     68.9 kg BSA:        1.75 m^2 Pt. Status: Room:  ATTENDING    Lyn Records, MD Duanne Moron     Lyn Records, MD REFERRING    Lyn Records, MD SONOGRAPHER  Aida Raider, RDCS PERFORMING   [facility], Outpatient  cc:  ------------------------------------------------------------------- LV EF: 60% -    65%  ------------------------------------------------------------------- Indications:      I51.7 Left Ventricular Hypertrophy.  ------------------------------------------------------------------- History:   PMH:  Acquired from the patient and from the patient&'s chart.  PMH:  Degenerative Joint Disease. CAD. CVA. Hypothyroidism.  ------------------------------------------------------------------- Study Conclusions  - Left ventricle: The cavity size was normal. Wall thickness was increased in a pattern of mild LVH. Systolic function was normal. The estimated ejection fraction was in the range of 60% to 65%. Wall motion was normal; there were no regional wall motion abnormalities. Doppler parameters are consistent with abnormal left ventricular relaxation (grade 1 diastolic dysfunction). The E/e&' ratio is between 8-15, suggesting indeterminate LV filling pressure. - Aortic valve: Sclerosis without stenosis. There was mild to moderate regurgitation. Regurgitation pressure half-time: 374 ms. - Mitral valve: Calcified annulus. There was mild regurgitation. - Left atrium: The atrium was normal in size. - Tricuspid valve: There was trivial regurgitation. - Pulmonary arteries: PA peak pressure: 29 mm Hg (S). - Inferior vena cava: The vessel was normal in size. The respirophasic diameter changes were in the normal range (= 50%), consistent with normal central venous pressure.  Impressions:  - LVEF 60-65%, mild LVH, normal wall motion, diastolic dysfunction, indeterminate LV filling pressure, aortic sclerosis with mild to moderate AI, mild MR, trivial TR, normal RVSP.  ------------------------------------------------------------------- Labs, prior tests, procedures, and surgery: Stents.          Transthoracic echocardiography.  M-mode, complete 2D, spectral Doppler, and color Doppler.  Birthdate:  Patient birthdate: [birth year].  Age:  Patient is 72 yr old.  Sex:  Gender: female.     BMI: 28.7 kg/m^2.  Blood pressure:     156/82  Patient status:  Outpatient.  Study date:  Study date: 06/14/2015. Study time: 03:32 PM.  Location:  [facility]  -------------------------------------------------------------------  ------------------------------------------------------------------- Left ventricle:  The cavity size was normal. Wall thickness was increased in a pattern of mild LVH. Systolic function was normal. The estimated ejection fraction was in the range of 60% to 65%. Wall motion was normal; there were no regional wall motion abnormalities. Doppler parameters are consistent with abnormal left ventricular relaxation (grade 1 diastolic dysfunction). The E/e&' ratio is between 8-15, suggesting indeterminate LV filling pressure.  ------------------------------------------------------------------- Aortic valve:   Trileaflet. Sclerosis without stenosis.  Doppler: There was mild to moderate regurgitation.  ------------------------------------------------------------------- Aorta:  Aortic root: The aortic root was normal in size. Ascending aorta: The ascending aorta was normal in size.  ------------------------------------------------------------------- Mitral valve:   Calcified annulus.  Doppler:  There was mild regurgitation.    Peak gradient (D): 4 mm Hg.  ------------------------------------------------------------------- Left atrium:  The atrium was normal in size.  ------------------------------------------------------------------- Atrial septum:  No defect or patent foramen ovale was identified.  ------------------------------------------------------------------- Right ventricle:  The cavity size was normal. Wall thickness was normal. Systolic function was normal.  ------------------------------------------------------------------- Pulmonic valve:    The valve appears to be grossly normal. Doppler:  There was no significant  regurgitation.  ------------------------------------------------------------------- Tricuspid valve:   Doppler:  There was trivial regurgitation.  ------------------------------------------------------------------- Pulmonary artery:  The main pulmonary artery was normal-sized.  ------------------------------------------------------------------- Right atrium:  The atrium was normal in size.  ------------------------------------------------------------------- Pericardium:  There was no pericardial effusion.  ------------------------------------------------------------------- Systemic veins: Inferior vena cava: The vessel was normal in size. The respirophasic diameter changes were in the normal range (= 50%), consistent with normal central venous pressure.  ------------------------------------------------------------------- Measurements  Left ventricle                           Value        Reference LV ID, ED, PLAX chordal          (L)     40.1  mm     43 - 52 LV ID, ES, PLAX chordal                  26.1  mm     23 - 38 LV fx shortening, PLAX chordal           35    %      >=29 LV PW thickness, ED                      9.97  mm     --------- IVS/LV PW ratio, ED                      0.7          <=1.3 Stroke volume, 2D                        61    ml     --------- Stroke volume/bsa, 2D                    35    ml/m^2 --------- LV e&', lateral                           9.54  cm/s   --------- LV E/e&', lateral                         10.9         --------- LV e&', medial                            7.02  cm/s   --------- LV E/e&', medial                          14.81        --------- LV e&', average                           8.28  cm/s   --------- LV E/e&', average                         12.56        ---------  Ventricular septum                       Value        Reference IVS thickness, ED                        6.93  mm     ---------  LVOT                                      Value        Reference LVOT ID, S                               18    mm     --------- LVOT area                                2.54  cm^2   --------- LVOT peak velocity, S                    93    cm/s   --------- LVOT mean velocity, S                    76.5  cm/s   --------- LVOT VTI, S                              23.9  cm     ---------  Aortic valve                             Value        Reference Aortic regurg pressure half-time         374   ms     ---------  Aorta                                    Value        Reference Aortic root ID, ED                       27    mm     ---------  Left atrium                              Value        Reference LA ID, A-P, ES                           38    mm     --------- LA ID/bsa, A-P                           2.18  cm/m^2 <=2.2 LA volume, S                             29    ml     --------- LA volume/bsa, S                         16.6  ml/m^2 --------- LA volume, ES, 1-p A4C                   25    ml     --------- LA volume/bsa, ES, 1-p A4C  14.3  ml/m^2 --------- LA volume, ES, 1-p A2C                   32    ml     --------- LA volume/bsa, ES, 1-p A2C               18.3  ml/m^2 ---------  Mitral valve                             Value        Reference Mitral E-wave peak velocity              104   cm/s   --------- Mitral A-wave peak velocity              105   cm/s   --------- Mitral deceleration time                 201   ms     150 - 230 Mitral peak gradient, D                  4     mm Hg  --------- Mitral E/A ratio, peak                   1            ---------  Pulmonary arteries                       Value        Reference PA pressure, S, DP                       29    mm Hg  <=30  Tricuspid valve                          Value        Reference Tricuspid regurg peak velocity           254   cm/s   --------- Tricuspid peak RV-RA gradient            26    mm Hg  ---------  Right ventricle                           Value        Reference RV s&', lateral, S                        13    cm/s   ---------  Legend: (L)  and  (H)  mark values outside specified reference range.  ------------------------------------------------------------------- Prepared and Electronically Authenticated by  Zoila Shutter MD 2016-10-31T16:02:57              Risk Assessment/Calculations:              Physical Exam:   VS:  BP 128/76   Pulse 68   Ht 5\' 3"  (1.6 m)   Wt 156 lb 3.2 oz (70.9 kg)   SpO2 97%   BMI 27.67 kg/m    Wt Readings from Last 3 Encounters:  12/15/22 156 lb 3.2 oz (70.9 kg)  07/13/22 159 lb (72.1 kg)  07/12/22 159 lb (72.1 kg)     GEN: Well nourished, well developed in no acute distress NECK: No  JVD; No carotid bruits CARDIAC: RRR, no murmurs, rubs, gallops RESPIRATORY:  Clear to auscultation without rales, wheezing or rhonchi  ABDOMEN: Soft, non-tender, non-distended EXTREMITIES:  No edema; No deformity   ASSESSMENT AND PLAN:   #CAD s/p DES to RCA in 2012: -Patient with history of DES to RCA in 2012 -Currently doing well without anginal symptoms -Continue ASA 81mg  daily -Continue crestor 10mg  daily  #Moderate AR: -Noted to be moderate on TTE in 2016 -Will repeat TTE for monitoring  #HTN: -Continue HCTZ 25mg  daily -Continue amlodipine 5mg  daily -BP at goal <130/90  #HLD: -Continue crestor 10mg  daily -LDL well controlled at 60        Signed, Meriam Sprague, MD

## 2022-12-15 ENCOUNTER — Ambulatory Visit: Payer: Medicare PPO | Attending: Cardiology | Admitting: Cardiology

## 2022-12-15 ENCOUNTER — Encounter: Payer: Self-pay | Admitting: Cardiology

## 2022-12-15 VITALS — BP 128/76 | HR 68 | Ht 63.0 in | Wt 156.2 lb

## 2022-12-15 DIAGNOSIS — I25118 Atherosclerotic heart disease of native coronary artery with other forms of angina pectoris: Secondary | ICD-10-CM | POA: Diagnosis not present

## 2022-12-15 DIAGNOSIS — I10 Essential (primary) hypertension: Secondary | ICD-10-CM | POA: Diagnosis not present

## 2022-12-15 DIAGNOSIS — I351 Nonrheumatic aortic (valve) insufficiency: Secondary | ICD-10-CM

## 2022-12-15 DIAGNOSIS — E782 Mixed hyperlipidemia: Secondary | ICD-10-CM

## 2022-12-15 NOTE — Patient Instructions (Addendum)
Medication Instructions:  Your physician recommends that you continue on your current medications as directed. Please refer to the Current Medication list given to you today.  *If you need a refill on your cardiac medications before your next appointment, please call your pharmacy*   Lab Work: NONE If you have labs (blood work) drawn today and your tests are completely normal, you will receive your results only by: MyChart Message (if you have MyChart) OR A paper copy in the mail If you have any lab test that is abnormal or we need to change your treatment, we will call you to review the results.   Testing/Procedures: Your physician has requested that you have an echocardiogram. Echocardiography is a painless test that uses sound waves to create images of your heart. It provides your doctor with information about the size and shape of your heart and how well your heart's chambers and valves are working. This procedure takes approximately one hour. There are no restrictions for this procedure. Please do NOT wear cologne, perfume, aftershave, or lotions (deodorant is allowed). Please arrive 15 minutes prior to your appointment time.   Follow-Up: 1 YEAR FOLLOW UP WITH DR. Shari Prows

## 2022-12-19 ENCOUNTER — Other Ambulatory Visit: Payer: Self-pay | Admitting: Obstetrics and Gynecology

## 2022-12-19 ENCOUNTER — Other Ambulatory Visit: Payer: Self-pay | Admitting: Physician Assistant

## 2022-12-19 DIAGNOSIS — Z1231 Encounter for screening mammogram for malignant neoplasm of breast: Secondary | ICD-10-CM

## 2022-12-19 DIAGNOSIS — M8580 Other specified disorders of bone density and structure, unspecified site: Secondary | ICD-10-CM

## 2023-01-15 ENCOUNTER — Ambulatory Visit (HOSPITAL_COMMUNITY): Payer: Medicare PPO | Attending: Cardiology

## 2023-01-15 DIAGNOSIS — I25118 Atherosclerotic heart disease of native coronary artery with other forms of angina pectoris: Secondary | ICD-10-CM | POA: Insufficient documentation

## 2023-01-15 DIAGNOSIS — E782 Mixed hyperlipidemia: Secondary | ICD-10-CM | POA: Diagnosis not present

## 2023-01-15 DIAGNOSIS — I351 Nonrheumatic aortic (valve) insufficiency: Secondary | ICD-10-CM

## 2023-01-15 DIAGNOSIS — I10 Essential (primary) hypertension: Secondary | ICD-10-CM | POA: Insufficient documentation

## 2023-01-15 LAB — ECHOCARDIOGRAM COMPLETE
AR max vel: 2.24 cm2
AV Area VTI: 2.21 cm2
AV Area mean vel: 2.23 cm2
AV Mean grad: 4 mmHg
AV Peak grad: 7.8 mmHg
Ao pk vel: 1.4 m/s
Area-P 1/2: 3.75 cm2
P 1/2 time: 468 msec
S' Lateral: 2.4 cm

## 2023-01-17 DIAGNOSIS — R350 Frequency of micturition: Secondary | ICD-10-CM | POA: Diagnosis not present

## 2023-01-17 DIAGNOSIS — R351 Nocturia: Secondary | ICD-10-CM | POA: Diagnosis not present

## 2023-01-17 DIAGNOSIS — N3941 Urge incontinence: Secondary | ICD-10-CM | POA: Diagnosis not present

## 2023-03-05 DIAGNOSIS — K5730 Diverticulosis of large intestine without perforation or abscess without bleeding: Secondary | ICD-10-CM | POA: Diagnosis not present

## 2023-03-05 DIAGNOSIS — Z1211 Encounter for screening for malignant neoplasm of colon: Secondary | ICD-10-CM | POA: Diagnosis not present

## 2023-03-05 DIAGNOSIS — K648 Other hemorrhoids: Secondary | ICD-10-CM | POA: Diagnosis not present

## 2023-04-30 DIAGNOSIS — H16223 Keratoconjunctivitis sicca, not specified as Sjogren's, bilateral: Secondary | ICD-10-CM | POA: Diagnosis not present

## 2023-04-30 DIAGNOSIS — H40003 Preglaucoma, unspecified, bilateral: Secondary | ICD-10-CM | POA: Diagnosis not present

## 2023-04-30 DIAGNOSIS — H2512 Age-related nuclear cataract, left eye: Secondary | ICD-10-CM | POA: Diagnosis not present

## 2023-05-02 DIAGNOSIS — L821 Other seborrheic keratosis: Secondary | ICD-10-CM | POA: Diagnosis not present

## 2023-05-07 DIAGNOSIS — M545 Low back pain, unspecified: Secondary | ICD-10-CM | POA: Diagnosis not present

## 2023-05-17 DIAGNOSIS — Z9181 History of falling: Secondary | ICD-10-CM | POA: Diagnosis not present

## 2023-05-17 DIAGNOSIS — I25118 Atherosclerotic heart disease of native coronary artery with other forms of angina pectoris: Secondary | ICD-10-CM | POA: Diagnosis not present

## 2023-05-17 DIAGNOSIS — I10 Essential (primary) hypertension: Secondary | ICD-10-CM | POA: Diagnosis not present

## 2023-05-17 DIAGNOSIS — R296 Repeated falls: Secondary | ICD-10-CM | POA: Diagnosis not present

## 2023-05-17 DIAGNOSIS — F32A Depression, unspecified: Secondary | ICD-10-CM | POA: Diagnosis not present

## 2023-05-17 DIAGNOSIS — F419 Anxiety disorder, unspecified: Secondary | ICD-10-CM | POA: Diagnosis not present

## 2023-05-17 DIAGNOSIS — R269 Unspecified abnormalities of gait and mobility: Secondary | ICD-10-CM | POA: Diagnosis not present

## 2023-05-17 DIAGNOSIS — G3184 Mild cognitive impairment, so stated: Secondary | ICD-10-CM | POA: Diagnosis not present

## 2023-06-11 ENCOUNTER — Other Ambulatory Visit: Payer: Self-pay | Admitting: Nurse Practitioner

## 2023-07-06 DIAGNOSIS — H938X1 Other specified disorders of right ear: Secondary | ICD-10-CM | POA: Diagnosis not present

## 2023-07-06 DIAGNOSIS — H6122 Impacted cerumen, left ear: Secondary | ICD-10-CM | POA: Diagnosis not present

## 2023-07-10 ENCOUNTER — Ambulatory Visit
Admission: RE | Admit: 2023-07-10 | Discharge: 2023-07-10 | Disposition: A | Discharge status: Home / Self Care | Payer: Medicare PPO | Source: Ambulatory Visit | Attending: Physician Assistant | Admitting: Physician Assistant

## 2023-07-10 ENCOUNTER — Ambulatory Visit
Admission: RE | Admit: 2023-07-10 | Discharge: 2023-07-10 | Disposition: A | Discharge status: Home / Self Care | Payer: Medicare PPO | Source: Ambulatory Visit | Attending: Obstetrics and Gynecology

## 2023-07-10 DIAGNOSIS — Z1231 Encounter for screening mammogram for malignant neoplasm of breast: Secondary | ICD-10-CM | POA: Diagnosis not present

## 2023-07-10 DIAGNOSIS — N958 Other specified menopausal and perimenopausal disorders: Secondary | ICD-10-CM | POA: Diagnosis not present

## 2023-07-10 DIAGNOSIS — M8580 Other specified disorders of bone density and structure, unspecified site: Secondary | ICD-10-CM

## 2023-07-10 DIAGNOSIS — M8588 Other specified disorders of bone density and structure, other site: Secondary | ICD-10-CM | POA: Diagnosis not present

## 2023-07-10 DIAGNOSIS — E2839 Other primary ovarian failure: Secondary | ICD-10-CM | POA: Diagnosis not present

## 2023-07-19 ENCOUNTER — Other Ambulatory Visit: Payer: Self-pay | Admitting: Physician Assistant

## 2023-07-19 DIAGNOSIS — R413 Other amnesia: Secondary | ICD-10-CM | POA: Diagnosis not present

## 2023-07-19 DIAGNOSIS — G2581 Restless legs syndrome: Secondary | ICD-10-CM | POA: Diagnosis not present

## 2023-07-19 DIAGNOSIS — R269 Unspecified abnormalities of gait and mobility: Secondary | ICD-10-CM

## 2023-07-19 DIAGNOSIS — M8588 Other specified disorders of bone density and structure, other site: Secondary | ICD-10-CM | POA: Diagnosis not present

## 2023-07-19 DIAGNOSIS — Z8673 Personal history of transient ischemic attack (TIA), and cerebral infarction without residual deficits: Secondary | ICD-10-CM | POA: Diagnosis not present

## 2023-07-19 DIAGNOSIS — Z780 Asymptomatic menopausal state: Secondary | ICD-10-CM | POA: Diagnosis not present

## 2023-07-19 DIAGNOSIS — R2689 Other abnormalities of gait and mobility: Secondary | ICD-10-CM | POA: Diagnosis not present

## 2023-08-03 ENCOUNTER — Ambulatory Visit
Admission: RE | Admit: 2023-08-03 | Discharge: 2023-08-03 | Disposition: A | Discharge status: Home / Self Care | Payer: Medicare PPO | Source: Ambulatory Visit | Attending: Physician Assistant | Admitting: Physician Assistant

## 2023-08-03 DIAGNOSIS — G319 Degenerative disease of nervous system, unspecified: Secondary | ICD-10-CM | POA: Diagnosis not present

## 2023-08-03 DIAGNOSIS — R269 Unspecified abnormalities of gait and mobility: Secondary | ICD-10-CM

## 2023-08-03 DIAGNOSIS — R2689 Other abnormalities of gait and mobility: Secondary | ICD-10-CM | POA: Diagnosis not present

## 2023-08-03 DIAGNOSIS — Z8673 Personal history of transient ischemic attack (TIA), and cerebral infarction without residual deficits: Secondary | ICD-10-CM

## 2023-08-03 DIAGNOSIS — R413 Other amnesia: Secondary | ICD-10-CM | POA: Diagnosis not present

## 2023-08-03 DIAGNOSIS — I6782 Cerebral ischemia: Secondary | ICD-10-CM | POA: Diagnosis not present

## 2023-08-17 DIAGNOSIS — M545 Low back pain, unspecified: Secondary | ICD-10-CM | POA: Diagnosis not present

## 2023-08-17 DIAGNOSIS — R202 Paresthesia of skin: Secondary | ICD-10-CM | POA: Diagnosis not present

## 2023-08-17 DIAGNOSIS — M546 Pain in thoracic spine: Secondary | ICD-10-CM | POA: Diagnosis not present

## 2023-08-20 DIAGNOSIS — E538 Deficiency of other specified B group vitamins: Secondary | ICD-10-CM | POA: Diagnosis not present

## 2023-08-27 DIAGNOSIS — M546 Pain in thoracic spine: Secondary | ICD-10-CM | POA: Diagnosis not present

## 2023-09-17 ENCOUNTER — Encounter: Payer: Self-pay | Admitting: *Deleted

## 2023-09-18 ENCOUNTER — Ambulatory Visit: Payer: Medicare PPO | Admitting: Neurology

## 2023-09-18 ENCOUNTER — Encounter: Payer: Self-pay | Admitting: Neurology

## 2023-09-18 VITALS — BP 131/55 | HR 67 | Ht 63.0 in | Wt 150.0 lb

## 2023-09-18 DIAGNOSIS — Z9189 Other specified personal risk factors, not elsewhere classified: Secondary | ICD-10-CM | POA: Diagnosis not present

## 2023-09-18 DIAGNOSIS — R419 Unspecified symptoms and signs involving cognitive functions and awareness: Secondary | ICD-10-CM | POA: Diagnosis not present

## 2023-09-18 DIAGNOSIS — R351 Nocturia: Secondary | ICD-10-CM

## 2023-09-18 DIAGNOSIS — R0683 Snoring: Secondary | ICD-10-CM

## 2023-09-18 DIAGNOSIS — Z79899 Other long term (current) drug therapy: Secondary | ICD-10-CM | POA: Diagnosis not present

## 2023-09-18 DIAGNOSIS — R269 Unspecified abnormalities of gait and mobility: Secondary | ICD-10-CM | POA: Diagnosis not present

## 2023-09-18 NOTE — Progress Notes (Signed)
 Subjective:    Patient ID: Beth Christian is a 73 y.o. female.  HPI    True Mar, MD, PhD [facility] [address] [address] [place], [place] [address]    Dear [name],  I saw your patient, Beth Christian, upon your kind request in my neurologic clinic today for evaluation of her gait disturbance and recurrent falls.  The patient is accompanied by her husband today.  I had evaluated her for gait disturbance and recurrent falls in 2022 and prior to that in 2021.  She had also been evaluated by Dr. Evonnie with [facility] in the past.  As you know, Beth Christian is a 73 year old female with an underlying medical history of coronary artery disease with status post stent placement, stroke, hypertension, hypothyroidism, restless leg syndrome, TMJ dysfunction, osteoarthritis, Raynaud's disease, hearing loss, and mildly overweight state, who reports intermittent hand tremors, intermittent difficulty walking, she feels that her balance is off, she has fallen.  She does not use her cane or her walkers, she has 2 types of walkers at home.  She admits that she does not hydrate well with water, she estimates that she drinks about 3 to 4 cups of water per day, she does drink alcohol regularly, about 3 to 4 glasses/week.  She recently started over-the-counter vitamin B12, 1000 mcg daily and over-the-counter vitamin D , 2000 units daily.  She had recent blood work through your office on 07/19/2023 which showed a low normal vitamin B12 at 235 and a mildly below normal vitamin D  at 29.2.  TSH was normal at 1.21.  Ferritin level was 63.5.  Of note, she takes high-risk medication in the form of clonazepam , currently 0.5 mg each night.  She is on Wellbutrin  300 mg in the morning and 150 mg in the evening.  She is also on Celexa  20 mg daily generic.  She is currently on donepezil 10 mg daily.  Her husband reports that she is more forgetful. Per husband, she snores.  She has previously  tried an oral appliance is understand.  She has not been on PAP therapy.  She has nocturia about twice per average night.  I reviewed your office note from 07/19/2023.  She had an MMSE of 29/30 at the time.  She requested a neurology referral other than [facility] and other than Dr. Evonnie at the time.  You ordered a brain MRI.    She had a brain MRI without contrast on 08/03/2023 and I reviewed the results:  IMPRESSION: 1. No evidence of an acute intracranial abnormality. 2. Chronic small vessel ischemic changes within the cerebral white matter and pons, overall mild-to-moderate in severity. Findings are similar to the prior brain MRI 12/30/2019. 3. Mild-to-moderate cerebral atrophy. 4. Mild cerebellar atrophy.  Previously:   11/17/2020: 73 year old right-handed woman with an underlying complex medical history of coronary artery disease with status post stenting in 2012, embolic stroke in 2012, degenerative joint disease, hypothyroidism, tendinitis, depression, osteopenia, restless leg syndrome, hypertension, hearing loss, sleep apnea, and overweight state, who presents for follow-up consultation of her gait and balance disorder.  The patient is unaccompanied today.  I first met her at the request of her primary care physician on 05/19/2020 at which time she reported difficulty with her gait and her balance.  Her husband was concerned about her lack of motivation and initiative.  She had undergone evaluation for memory issues through [facility] neurology and had seen Dr. Evonnie in the recent past.  Counseling and  evaluation through psychiatry had been discussed, she had not pursued this.  She did worry about her mother's health.   I counseled her regarding daily alcohol consumption. I made a referral to PT.    She reports no significant change.  She did go to physical therapy at [facility].  She was not advised to use a walking aid.  She was given instructions regarding exercises at home or  handouts but admits that she has not done any of these exercises.  She has fallen.  She bumped her head but there was no sign of bruising or swelling or laceration, she did not lose consciousness and did not have any subsequent headaches.  She did not go to the ER or get checked out elsewhere.  She did have an injury to her right arm and went to [facility] to get stitches. She has not reduced her clonazepam  dose.  She continues to drink alcohol on a daily basis and continues to endorse stress at home.  She feels that she is stressed because she cannot speak to her husband and her daughter who lives with them does not have much in the way of relationship with her.  Her older daughter comes and visits occasionally and she has a better relationship with her older daughter.  She reports being unhappy but not particularly sad.  She denies any sadness or happiness, reports that she no longer smiles, she feels indifferent.  She has not sought counseling or a change in her antidepressant medication or evaluation through psychiatry. She has not addressed this issue with her primary care physician lately.  She does not have an appointment pending, had a physical recently.   She reports that she had suggested marriage counseling early on when she got married to her husband but he did not want to pursue this, so she stopped asking him.       05/19/20: (She) reports difficulty with her gait and balance.  Her history is primarily provided by her husband, especially in the beginning as she did not answer right away and he started talking and offering most of the information.  He is primarily worried about her lack of motivation, lack of initiative, unsteady gait, memory loss, falls.  He reports that she does not exercise, she has been forgetful.  She often does not change from her pajamas and sits and watches her cell phone.   I reviewed your office note from 03/15/2020. She was advised to consider counseling.  She  declined a psychiatry referral at the time. She was worried about her mother. She had been seeing Dr. Evonnie in neurology and had work-up in the form of brain MRI as well as neuropsychological testing. I reviewed Dr. 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  She had declined a psychiatry referral.  I reviewed her brain MRI from 12/30/2019, without contrast: IMPRESSION: 1. No acute intracranial abnormality. 2. Mild chronic small vessel ischemia and moderate parenchymal volume loss. 3. Prominence of the supratentorial ventricles with crowding of the high convexity sulci which may be seen in the setting of normal pressure hydrocephalus in the appropriate clinical scenario. This has not significantly changed from prior.     She was not felt to have normal pressure hydrocephalus by Dr. Evonnie.  She was advised to follow-up as needed.  A repeat neuropsychological evaluation was considered  in 1 to 2 years.   She endorses not exercising, she does not hydrate very much with water, they estimate that she drinks about 3 to 4 cups of water per day.  She has had physical therapy on more than one occasion.  She was told to use a walker and cane but does not use either.  She has no telltale family history of dementia but mom is 3 years old and has some memory loss.  She has not pursued counseling or an appointment with psychiatry.  They have not pursued marriage counseling.     Her Past Medical History Is Significant For: Past Medical History:  Diagnosis Date   Acrocyanosis (HCC)    Articular cartilage disorder involving shoulder region, left 12/2016   Coronary artery disease involving native coronary artery 2012   DES RCA x 2 (most recently for ISR)   CVA (cerebral infarction) 01/25/2011   during heart cath - no residual  deficits   Depression    DJD (degenerative joint disease)    Gait disorder    Grinding of teeth    History of CVA (cerebrovascular accident)    HTN (hypertension)    states under control with meds., has been on med. x 6 mos.   Hypothyroidism    Immature cataract of both eyes    Memory loss    Multiple falls    OSA (obstructive sleep apnea)    Osteoarthritis of shoulder    left   Osteopenia    Poor balance    Raynaud's disease    RLS (restless legs syndrome)    Rotator cuff tear, left 12/2016   Stress incontinence    Thoracic aortic atherosclerosis (HCC)    Tinnitus    TMJ dysfunction    Urinary, incontinence, stress female    Wears hearing aid in both ears     Her Past Surgical History Is Significant For: Past Surgical History:  Procedure Laterality Date   BREAST BIOPSY Right 09/15/2014   stromal hyalinization and small benign fibroadenoma   COLONOSCOPY WITH PROPOFOL  N/A 12/02/2012   Procedure: COLONOSCOPY WITH PROPOFOL ;  Surgeon: Gladis MARLA Louder, MD;  Location: [facility];  Service: Endoscopy;  Laterality: N/A;   CORONARY ANGIOPLASTY WITH STENT PLACEMENT  01/25/2011   proximal RCA   LEFT HEART CATHETERIZATION WITH CORONARY ANGIOGRAM N/A 07/07/2011   Procedure: LEFT HEART CATHETERIZATION WITH CORONARY ANGIOGRAM;  Surgeon: Victory LELON Claudene DOUGLAS, MD;  Location: [facility] CATH LAB;  Service: Cardiovascular;  Laterality: N/A;  Femoral   ORIF DISTAL RADIUS FRACTURE Right 06/11/2012   PERCUTANEOUS CORONARY STENT INTERVENTION (PCI-S) N/A 07/07/2011   Procedure: PERCUTANEOUS CORONARY STENT INTERVENTION (PCI-S);  Surgeon: Victory LELON Claudene DOUGLAS, MD;  Location: [facility] CATH LAB;  Service: Cardiovascular;  Laterality: N/A;   SHOULDER CLOSED REDUCTION Left 01/11/2017   Procedure: CLOSED MANIPULATION SHOULDER;  Surgeon: Beverley Toribio FALCON, MD;  Location: [facility];  Service: Orthopedics;  Laterality: Left;   TENDON REPAIR Left    thumb   TONSILLECTOMY     as child   TURP/cystoscopy,  [facility]     WRIST SURGERY      Her Family History Is Significant For: Family History  Problem Relation Age of Onset   Hypertension Mother    Breast cancer Mother 45   Lymphoma Mother    Alzheimer's disease Mother    Dementia Mother    Kidney failure Father    Coronary artery disease Father    Throat cancer Sister 23   Hypertension  Sister    Ovarian cancer Paternal Aunt        ? ovar vs cx vs uterine   Melanoma Paternal Aunt    Melanoma Other     Her Social History Is Significant For: Social History   Socioeconomic History   Marital status: Married    Spouse name: Not on file   Number of children: Not on file   Years of education: Not on file   Highest education level: Not on file  Occupational History   Occupation: retired    Comment: cafeteria, school  Tobacco Use   Smoking status: Former    Current packs/day: 0.00    Types: Cigarettes    Quit date: 07/07/1979    Years since quitting: 44.2   Smokeless tobacco: Never  Vaping Use   Vaping status: Never Used  Substance and Sexual Activity   Alcohol use: Yes    Comment: occasionally   Drug use: No   Sexual activity: Not Currently    Birth control/protection: Post-menopausal  Other Topics Concern   Not on file  Social History Narrative   Not on file   Social Drivers of Health   Financial Resource Strain: Not on file  Food Insecurity: Not on file  Transportation Needs: Not on file  Physical Activity: Not on file  Stress: Not on file  Social Connections: Not on file    Her Allergies Are:  Allergies  Allergen Reactions   Latex Itching and Rash  :   Her Current Medications Are:  Outpatient Encounter Medications as of 09/18/2023  Medication Sig   Acetaminophen  (TYLENOL  PO) Take 500 mg by mouth as needed.   alendronate (FOSAMAX) 70 MG tablet Take 70 mg by mouth once a week.   amLODipine  (NORVASC ) 5 MG tablet Take 5 mg by mouth daily.   aspirin  EC 81 MG tablet Take 81 mg by mouth daily.   azelastine  (ASTELIN) 0.1 % nasal spray Place into both nostrils 2 (two) times daily. Use in each nostril as directed   buPROPion  (WELLBUTRIN  XL) 150 MG 24 hr tablet Take 150 mg by mouth 3 (three) times daily.   celecoxib (CELEBREX) 200 MG capsule Take 200 mg by mouth daily as needed.   citalopram  (CELEXA ) 20 MG tablet Take 20 mg by mouth daily.   clonazePAM  (KLONOPIN ) 0.5 MG tablet Take 0.5 mg by mouth at bedtime.   clonazePAM  (KLONOPIN ) 1 MG tablet    donepezil (ARICEPT) 10 MG tablet Take 10 mg by mouth at bedtime.   hydrochlorothiazide  (HYDRODIURIL ) 25 MG tablet TAKE 1 TABLET (25 MG TOTAL) BY MOUTH DAILY.   levothyroxine  (SYNTHROID , LEVOTHROID) 75 MCG tablet Take 75 mcg by mouth daily.   MYRBETRIQ 50 MG TB24 tablet Take 50 mg by mouth daily.   nitrofurantoin , macrocrystal-monohydrate, (MACROBID ) 100 MG capsule Take 1 capsule (100 mg total) by mouth 2 (two) times daily.   Propylene Glycol-Glycerin (ARTIFICIAL TEARS) 1-0.3 % SOLN 4 drops in each eye   rosuvastatin  (CRESTOR ) 10 MG tablet TAKE 1 TABLET (10 MG TOTAL) BY MOUTH AT BEDTIME. *REFILL 09/11/16*   UNABLE TO FIND Med Name: artificial tears   VITAMIN D  PO Take 5,000 Units by mouth.   No facility-administered encounter medications on file as of 09/18/2023.  :   Review of Systems:  Out of a complete 14 point review of systems, all are reviewed and negative with the exception of these symptoms as listed below:  Review of Systems  Neurological:  Patient in room #9 with her husband. Patient husband states she has falling a few times last month and been forgetful with everything. Patient states her hand shakes a lot.    Objective:  Neurological Exam  Physical Exam Physical Examination:   Vitals:   09/18/23 1449  BP: (!) 131/55  Pulse: 67    General Examination: The patient is a very pleasant 73 y.o. female in no acute distress. She appears well-developed and well-nourished and well groomed.   HEENT: Normocephalic, atraumatic, pupils  are equal, round and reactive to light, face is symmetric, no obvious facial masking is noted, no nuchal rigidity, no lip, neck or jaw tremor.  Extraocular tracking is well preserved, hearing is grossly intact, no carotid bruits.  Airway examination reveals mild to moderate mouth dryness, tongue protrudes centrally and palate elevates symmetrically.  Moderate airway crowding is noted, tonsils absent.  Mallampati class III.   Chest: Clear to auscultation without wheezing, rhonchi or crackles noted.   Heart: S1+S2+0, regular and normal without murmurs, rubs or gallops noted.    Abdomen: Soft, non-tender and non-distended.   Extremities: There is no pitting edema in the distal lower extremities bilaterally.    Skin: Warm and dry without trophic changes noted.   Musculoskeletal: exam reveals no obvious joint deformities, tenderness or joint swelling or erythema.    Neurologically:  Mental status: The patient is awake, alert and oriented, but she is minimally verbal, history is supplemented by her husband.       09/18/2023    2:52 PM 12/17/2019   11:00 AM 11/22/2017   11:31 AM  MMSE - Mini Mental State Exam  Not completed:   Unable to complete  Orientation to time 5 5   Orientation to Place 5 5   Registration 3 3   Attention/ Calculation 2 5   Recall 3 2   Language- name 2 objects 2 2   Language- repeat 1 1   Language- follow 3 step command 2 3   Language- read & follow direction 1 1   Write a sentence 1 1   Copy design 1 0   Total score 26 28     On 09/18/2023: CDT: 4/4, AFT: 9/min.  Cranial nerves II - XII are as described above under HEENT exam. Motor exam: Normal bulk, strength and tone is noted. There is no drift, no resting tremor.  No significant postural or action tremor noted today. Reflexes are 1+ throughout. Fine motor skills and coordination: grossly intact with finger taps, hand movements and rapid alternating patting as well as foot taps bilaterally.  Cerebellar testing:  No dysmetria or intention tremor. There is no truncal or gait ataxia.  Sensory exam: intact to light touch in the upper and lower extremities.  Gait, station and balance: She stands easily. No veering to one side is noted. No leaning to one side is noted. Posture is age-appropriate and stance is narrow based. She walks slowly and cautiously.  She walks without a walking aid.  She has preserved arm swing bilaterally.     Assessment and Plan:    In summary, Beth Christian is a 73 year old female with an underlying complex medical history of coronary artery disease with status post stenting in 2012, embolic stroke in 2012, degenerative joint disease, hypothyroidism, tendinitis, depression, osteopenia, restless leg syndrome, hypertension, hearing loss, sleep apnea, and overweight state, who presents for re-evaluation of her gait and balance disorder.  She has had cognitive complaints as well.  I  had a long discussion with the patient and her husband again today.  Below are our discussion points and my recommendations for her.  She was given these instructions verbally and also in writing in her MyChart after visit summary which her husband can access for her electronically:  <<   Based on your symptoms and your exam I believe you are at risk for obstructive sleep apnea (aka OSA). We should proceed with a sleep study to determine whether you do or do not have OSA and how severe it is. Even, if you have mild OSA, I may want you to consider treatment with CPAP, as treatment of even borderline or mild sleep apnea can result and improvement of symptoms such as sleep disruption, daytime sleepiness, nighttime bathroom breaks, restless leg symptoms, improvement of headache syndromes, even improved mood disorder. Please remember, the long-term risks and ramifications of untreated moderate to severe obstructive sleep apnea may include (but are not limited to): increased risk for cardiovascular disease, including  congestive heart failure, stroke, difficult to control hypertension, treatment resistant obesity, arrhythmias, especially irregular heartbeat commonly known as A. Fib. (atrial fibrillation); even type 2 diabetes has been linked to untreated OSA. Please increase your water intake to 6-8 cups per day, 8 oz size each.  Please stop drinking alcohol altogether Please talk to [facility] about coming off the Klonopin .  Continue to take vitamin D  and B12 and have your PCP recheck your levels in the next 3-6 months.  Please use your walker at all times, either walker.  We will plan a follow up after your sleep study.  >> This was an extended visit of over 60 minutes with high complexity due to addressing multiple issues and considerable chart review and counseling and coordination of care involved.  Thank you very much for allowing me to participate in the care of this nice patient. If I can be of any further assistance to you please do not hesitate to call me at [phone].  Sincerely,   True Mar, MD, PhD

## 2023-09-18 NOTE — Patient Instructions (Signed)
 It was nice to see you today. Here is what we discussed today:    Based on your symptoms and your exam I believe you are at risk for obstructive sleep apnea (aka OSA). We should proceed with a sleep study to determine whether you do or do not have OSA and how severe it is. Even, if you have mild OSA, I may want you to consider treatment with CPAP, as treatment of even borderline or mild sleep apnea can result and improvement of symptoms such as sleep disruption, daytime sleepiness, nighttime bathroom breaks, restless leg symptoms, improvement of headache syndromes, even improved mood disorder. Please remember, the long-term risks and ramifications of untreated moderate to severe obstructive sleep apnea may include (but are not limited to): increased risk for cardiovascular disease, including congestive heart failure, stroke, difficult to control hypertension, treatment resistant obesity, arrhythmias, especially irregular heartbeat commonly known as A. Fib. (atrial fibrillation); even type 2 diabetes has been linked to untreated OSA. Please increase your water intake to 6-8 cups per day, 8 oz size each.  Please stop drinking alcohol altogether Please talk to [facility] about coming off the Klonopin .  Continue to take vitamin D  and B12 and have your PCP recheck your levels in the next 3-6 months.  Please use your walker at all times, either walker.  We will plan a follow up after your sleep study.

## 2023-09-20 ENCOUNTER — Telehealth: Payer: Self-pay | Admitting: Neurology

## 2023-09-20 NOTE — Telephone Encounter (Signed)
 spoke to the patient she states she wcb to schedule when she is ready   [employer] auth: [id number] (exp. 09/19/23 to 01/01/24)

## 2023-09-25 DIAGNOSIS — S22050K Wedge compression fracture of T5-T6 vertebra, subsequent encounter for fracture with nonunion: Secondary | ICD-10-CM | POA: Diagnosis not present

## 2023-09-25 DIAGNOSIS — L821 Other seborrheic keratosis: Secondary | ICD-10-CM | POA: Diagnosis not present

## 2023-09-25 DIAGNOSIS — L814 Other melanin hyperpigmentation: Secondary | ICD-10-CM | POA: Diagnosis not present

## 2023-09-25 DIAGNOSIS — L565 Disseminated superficial actinic porokeratosis (DSAP): Secondary | ICD-10-CM | POA: Diagnosis not present

## 2023-10-02 DIAGNOSIS — S22050K Wedge compression fracture of T5-T6 vertebra, subsequent encounter for fracture with nonunion: Secondary | ICD-10-CM | POA: Diagnosis not present

## 2023-10-02 DIAGNOSIS — M6281 Muscle weakness (generalized): Secondary | ICD-10-CM | POA: Diagnosis not present

## 2023-10-02 DIAGNOSIS — R269 Unspecified abnormalities of gait and mobility: Secondary | ICD-10-CM | POA: Diagnosis not present

## 2023-10-09 DIAGNOSIS — R269 Unspecified abnormalities of gait and mobility: Secondary | ICD-10-CM | POA: Diagnosis not present

## 2023-10-09 DIAGNOSIS — S22050K Wedge compression fracture of T5-T6 vertebra, subsequent encounter for fracture with nonunion: Secondary | ICD-10-CM | POA: Diagnosis not present

## 2023-10-09 DIAGNOSIS — M6281 Muscle weakness (generalized): Secondary | ICD-10-CM | POA: Diagnosis not present

## 2023-10-16 DIAGNOSIS — M6281 Muscle weakness (generalized): Secondary | ICD-10-CM | POA: Diagnosis not present

## 2023-10-16 DIAGNOSIS — S22050K Wedge compression fracture of T5-T6 vertebra, subsequent encounter for fracture with nonunion: Secondary | ICD-10-CM | POA: Diagnosis not present

## 2023-10-16 DIAGNOSIS — R269 Unspecified abnormalities of gait and mobility: Secondary | ICD-10-CM | POA: Diagnosis not present

## 2023-10-23 DIAGNOSIS — M6281 Muscle weakness (generalized): Secondary | ICD-10-CM | POA: Diagnosis not present

## 2023-10-23 DIAGNOSIS — S22050K Wedge compression fracture of T5-T6 vertebra, subsequent encounter for fracture with nonunion: Secondary | ICD-10-CM | POA: Diagnosis not present

## 2023-10-23 DIAGNOSIS — R269 Unspecified abnormalities of gait and mobility: Secondary | ICD-10-CM | POA: Diagnosis not present

## 2023-10-30 DIAGNOSIS — S22050K Wedge compression fracture of T5-T6 vertebra, subsequent encounter for fracture with nonunion: Secondary | ICD-10-CM | POA: Diagnosis not present

## 2023-10-30 DIAGNOSIS — R269 Unspecified abnormalities of gait and mobility: Secondary | ICD-10-CM | POA: Diagnosis not present

## 2023-10-30 DIAGNOSIS — M6281 Muscle weakness (generalized): Secondary | ICD-10-CM | POA: Diagnosis not present

## 2023-11-06 DIAGNOSIS — M6281 Muscle weakness (generalized): Secondary | ICD-10-CM | POA: Diagnosis not present

## 2023-11-06 DIAGNOSIS — S22050K Wedge compression fracture of T5-T6 vertebra, subsequent encounter for fracture with nonunion: Secondary | ICD-10-CM | POA: Diagnosis not present

## 2023-11-06 DIAGNOSIS — R269 Unspecified abnormalities of gait and mobility: Secondary | ICD-10-CM | POA: Diagnosis not present

## 2023-11-14 DIAGNOSIS — M6281 Muscle weakness (generalized): Secondary | ICD-10-CM | POA: Diagnosis not present

## 2023-11-14 DIAGNOSIS — S22050K Wedge compression fracture of T5-T6 vertebra, subsequent encounter for fracture with nonunion: Secondary | ICD-10-CM | POA: Diagnosis not present

## 2023-11-14 DIAGNOSIS — R269 Unspecified abnormalities of gait and mobility: Secondary | ICD-10-CM | POA: Diagnosis not present

## 2023-11-20 DIAGNOSIS — S22050K Wedge compression fracture of T5-T6 vertebra, subsequent encounter for fracture with nonunion: Secondary | ICD-10-CM | POA: Diagnosis not present

## 2023-11-20 DIAGNOSIS — R269 Unspecified abnormalities of gait and mobility: Secondary | ICD-10-CM | POA: Diagnosis not present

## 2023-11-20 DIAGNOSIS — M6281 Muscle weakness (generalized): Secondary | ICD-10-CM | POA: Diagnosis not present

## 2023-11-29 DIAGNOSIS — Z23 Encounter for immunization: Secondary | ICD-10-CM | POA: Diagnosis not present

## 2023-11-29 DIAGNOSIS — E559 Vitamin D deficiency, unspecified: Secondary | ICD-10-CM | POA: Diagnosis not present

## 2023-11-29 DIAGNOSIS — E7800 Pure hypercholesterolemia, unspecified: Secondary | ICD-10-CM | POA: Diagnosis not present

## 2023-11-29 DIAGNOSIS — Z0000 Encounter for general adult medical examination without abnormal findings: Secondary | ICD-10-CM | POA: Diagnosis not present

## 2023-11-29 DIAGNOSIS — G2581 Restless legs syndrome: Secondary | ICD-10-CM | POA: Diagnosis not present

## 2023-11-29 DIAGNOSIS — I2510 Atherosclerotic heart disease of native coronary artery without angina pectoris: Secondary | ICD-10-CM | POA: Diagnosis not present

## 2023-11-29 DIAGNOSIS — R413 Other amnesia: Secondary | ICD-10-CM | POA: Diagnosis not present

## 2023-11-29 DIAGNOSIS — E039 Hypothyroidism, unspecified: Secondary | ICD-10-CM | POA: Diagnosis not present

## 2023-11-29 DIAGNOSIS — Z780 Asymptomatic menopausal state: Secondary | ICD-10-CM | POA: Diagnosis not present

## 2023-11-29 DIAGNOSIS — E538 Deficiency of other specified B group vitamins: Secondary | ICD-10-CM | POA: Diagnosis not present

## 2023-11-29 DIAGNOSIS — M8580 Other specified disorders of bone density and structure, unspecified site: Secondary | ICD-10-CM | POA: Diagnosis not present

## 2023-11-29 DIAGNOSIS — Z1331 Encounter for screening for depression: Secondary | ICD-10-CM | POA: Diagnosis not present

## 2023-11-29 DIAGNOSIS — I10 Essential (primary) hypertension: Secondary | ICD-10-CM | POA: Diagnosis not present

## 2023-12-10 ENCOUNTER — Ambulatory Visit: Payer: Medicare PPO | Attending: Cardiology | Admitting: Cardiology

## 2023-12-10 ENCOUNTER — Encounter: Payer: Self-pay | Admitting: Cardiology

## 2023-12-10 VITALS — BP 126/68 | HR 64 | Ht 63.0 in | Wt 155.0 lb

## 2023-12-10 DIAGNOSIS — I351 Nonrheumatic aortic (valve) insufficiency: Secondary | ICD-10-CM

## 2023-12-10 DIAGNOSIS — I10 Essential (primary) hypertension: Secondary | ICD-10-CM | POA: Diagnosis not present

## 2023-12-10 DIAGNOSIS — I25118 Atherosclerotic heart disease of native coronary artery with other forms of angina pectoris: Secondary | ICD-10-CM

## 2023-12-10 NOTE — Patient Instructions (Signed)
 Medication Instructions:  Your physician recommends that you continue on your current medications as directed. Please refer to the Current Medication list given to you today.  *If you need a refill on your cardiac medications before your next appointment, please call your pharmacy*  Lab Work: None ordered.  If you have labs (blood work) drawn today and your tests are completely normal, you will receive your results only by: MyChart Message (if you have MyChart) OR A paper copy in the mail If you have any lab test that is abnormal or we need to change your treatment, we will call you to review the results.  Testing/Procedures: None ordered.   Follow-Up: At [facility], you and your health needs are our priority.  As part of our continuing mission to provide you with exceptional heart care, our providers are all part of one team.  This team includes your primary Cardiologist (physician) and Advanced Practice Providers or APPs (Physician Assistants and Nurse Practitioners) who all work together to provide you with the care you need, when you need it.  Your next appointment:   24 month follow up with Dr Renna Cary

## 2023-12-10 NOTE — Progress Notes (Signed)
 Cardiology Office Note:  .   Date:  12/10/2023  ID:  Beth Christian, DOB 1951-08-13, MRN [id number] PCP: Karalee Oscar, PA  [facility] Providers Cardiologist:  Dorothye Gathers, MD     History of Present Illness: .   Beth Christian is a 73 y.o. female Discussed the use of AI scribe software for clinical note transcription with the patient, who gave verbal consent to proceed.  History of Present Illness Beth Christian is a 73 year old female with coronary artery disease who presents for follow-up after a drug-eluting stent placement.  In 2021, she underwent a drug-eluting stent placement in the right coronary artery, which was complicated by a paraprocedural embolic stroke. Since the procedure, she has been stable without any chest pain. She experiences shortness of breath only when walking fast and admits to limited physical activity. She is currently taking aspirin  81 mg daily.  She has a history of hypertension, managed with hydrochlorothiazide  25 mg daily and amlodipine  5 mg daily. Her hyperlipidemia is controlled with rosuvastatin  10 mg daily, and her recent LDL cholesterol level was 76 mg/dL.  She has moderate aortic regurgitation, which has remained stable. Her last echocardiogram in 2024 indicated mild regurgitation.  She experiences balance issues, describing her gait as unstable and noting difficulty walking straight. These issues have persisted for about two years, and as a result, her husband does not allow her to drive.  She has restless leg syndrome, which is disruptive to her husband during sleep. She finds that her legs become numb more quickly when covered with a sheet rather than a quilt, which aids her sleep.  She is on donepezil for memory impairment.   Studies Reviewed: Aaron Aas   EKG Interpretation Date/Time:  Monday December 10 2023 10:55:58 EDT Ventricular Rate:  64 PR Interval:  126 QRS Duration:  92 QT Interval:  434 QTC Calculation: 447 R  Axis:   48  Text Interpretation: Normal sinus rhythm Normal ECG When compared with ECG of 05-Jan-2017 12:12, No significant change was found Confirmed by Dorothye Gathers ([id number]) on 12/10/2023 11:00:32 AM    Results LABS LDL: 76 mg/dL  DIAGNOSTIC Echocardiogram: Mild aortic regurgitation (2024) Risk Assessment/Calculations:            Physical Exam:   VS:  BP 126/68   Pulse 64   Ht 5\' 3"  (1.6 m)   Wt 155 lb (70.3 kg)   SpO2 99%   BMI 27.46 kg/m    Wt Readings from Last 3 Encounters:  12/10/23 155 lb (70.3 kg)  09/18/23 150 lb (68 kg)  12/15/22 156 lb 3.2 oz (70.9 kg)    GEN: Well nourished, well developed in no acute distress NECK: No JVD; No carotid bruits CARDIAC: RRR, no murmurs, no rubs, no gallops RESPIRATORY:  Clear to auscultation without rales, wheezing or rhonchi  ABDOMEN: Soft, non-tender, non-distended EXTREMITIES:  No edema; No deformity   ASSESSMENT AND PLAN: .    Assessment and Plan Assessment & Plan Coronary artery disease with history of stent placement Coronary artery disease status post drug-eluting stent placement to the RCA in 2021. No current chest pain or significant dyspnea. Good medication adherence. Encouraged to increase physical activity to improve cardiovascular health. - Continue aspirin  81 mg daily - Encourage regular physical activity, aiming for 30 minutes a day or at least every other day  Moderate aortic regurgitation Moderate aortic regurgitation previously with most recent echocardiogram in 2024 showing mild regurgitation. Plan to reassess with echocardiogram  in approximately five years unless symptoms change.  Embolic stroke Paraprocedural embolic stroke post-stent placement in 2021. Residual effects included transient diplopia which resolved within six months. No current neurological deficits.  Hypertension Hypertension well-controlled with current medication regimen. - Continue hydrochlorothiazide  25 mg daily - Continue amlodipine   5 mg daily  Hyperlipidemia Hyperlipidemia managed with rosuvastatin . Recent LDL level is 76 mg/dL, close to the target of less than 70 mg/dL. - Continue rosuvastatin  10 mg daily  Restless leg syndrome Restless leg syndrome causing nocturnal discomfort and affecting sleep quality.  Memory impairment Memory impairment managed with donepezil. - Continue donepezil         Dispo: 2 yrs  Signed, Dorothye Gathers, MD

## 2023-12-18 DIAGNOSIS — G4733 Obstructive sleep apnea (adult) (pediatric): Secondary | ICD-10-CM | POA: Diagnosis not present

## 2023-12-18 DIAGNOSIS — G2581 Restless legs syndrome: Secondary | ICD-10-CM | POA: Diagnosis not present

## 2023-12-25 ENCOUNTER — Encounter: Payer: Self-pay | Admitting: Podiatry

## 2023-12-25 ENCOUNTER — Ambulatory Visit: Admitting: Podiatry

## 2023-12-25 DIAGNOSIS — R2681 Unsteadiness on feet: Secondary | ICD-10-CM

## 2023-12-25 DIAGNOSIS — Z9181 History of falling: Secondary | ICD-10-CM

## 2023-12-25 NOTE — Progress Notes (Signed)
 Chief Complaint  Patient presents with   Foot Orthotics    Husband stated, "She's been having some balance issues.  She may need orthotics." N - balance issues L - bilateral D - 2 years O - gradually worse C - lose balance A - walking T - brought several different shoes   Callouses    "I have calluses or warts on my left foot." N - calluses L - 4,5 met, heel left D - years O - gradually worse C - sore A - walking, especially barefoot T - none    HPI: 73 y.o. female presenting today as a new patient with her husband for evaluation of instability and history of falls.  Progressive onset over the last few years.  They have been to neurology and seeing several physicians to help diagnose her progressive instability and imbalance.  She is interested in orthotics  She also experiencing asymptomatic callus to the plantar aspect of the left foot more painful when she walks barefoot  Past Medical History:  Diagnosis Date   Acrocyanosis (HCC)    Articular cartilage disorder involving shoulder region, left 12/2016   Coronary artery disease involving native coronary artery 2012   DES RCA x 2 (most recently for ISR)   CVA (cerebral infarction) 01/25/2011   during heart cath - no residual deficits   Depression    DJD (degenerative joint disease)    Gait disorder    Grinding of teeth    History of CVA (cerebrovascular accident)    HTN (hypertension)    states under control with meds., has been on med. x 6 mos.   Hypothyroidism    Immature cataract of both eyes    Memory loss    Multiple falls    OSA (obstructive sleep apnea)    Osteoarthritis of shoulder    left   Osteopenia    Poor balance    Raynaud's disease    RLS (restless legs syndrome)    Rotator cuff tear, left 12/2016   Stress incontinence    Thoracic aortic atherosclerosis (HCC)    Tinnitus    TMJ dysfunction    Urinary, incontinence, stress female    Wears hearing aid in both ears     Past Surgical  History:  Procedure Laterality Date   BREAST BIOPSY Right 09/15/2014   stromal hyalinization and small benign fibroadenoma   COLONOSCOPY WITH PROPOFOL  N/A 12/02/2012   Procedure: COLONOSCOPY WITH PROPOFOL ;  Surgeon: Garrett Kallman, MD;  Location: [facility];  Service: Endoscopy;  Laterality: N/A;   CORONARY ANGIOPLASTY WITH STENT PLACEMENT  01/25/2011   proximal RCA   LEFT HEART CATHETERIZATION WITH CORONARY ANGIOGRAM N/A 07/07/2011   Procedure: LEFT HEART CATHETERIZATION WITH CORONARY ANGIOGRAM;  Surgeon: Mickiel Albany, MD;  Location: [facility] CATH LAB;  Service: Cardiovascular;  Laterality: N/A;  Femoral   ORIF DISTAL RADIUS FRACTURE Right 06/11/2012   PERCUTANEOUS CORONARY STENT INTERVENTION (PCI-S) N/A 07/07/2011   Procedure: PERCUTANEOUS CORONARY STENT INTERVENTION (PCI-S);  Surgeon: Mickiel Albany, MD;  Location: [facility] CATH LAB;  Service: Cardiovascular;  Laterality: N/A;   SHOULDER CLOSED REDUCTION Left 01/11/2017   Procedure: CLOSED MANIPULATION SHOULDER;  Surgeon: Ferd Householder, MD;  Location: [facility];  Service: Orthopedics;  Laterality: Left;   TENDON REPAIR Left    thumb   TONSILLECTOMY     as child   TURP/cystoscopy, [place]     WRIST SURGERY      Allergies  Allergen Reactions  Latex Itching and Rash     Physical Exam: General: The patient is alert and oriented x3 in no acute distress.  Dermatology: Skin is warm, dry and supple bilateral lower extremities. Hyperkeratotic skin lesions noted with a central nucleated core plantar aspect of the fifth MTP of the left foot  Vascular: Palpable pedal pulses bilaterally. Capillary refill within normal limits.  No appreciable edema.  No erythema.    Neurological: Grossly intact via light touch  Musculoskeletal Exam: No pedal deformities noted.  Gait instability noted with history of falls.  Muscle strength 5/5 all compartments.  ROM WNL bilateral lower extremities   Assessment/Plan of Care: 1.   Gait instability with history of falls bilateral lower extremities 2.  Symptomatic callus plantar aspect of the fifth MTP left foot  -Patient evaluated -Excisional debridement of the hyperkeratotic skin lesions was performed today using a 312 scalpel without incident or bleeding.  Patient felt relief -Appointment with orthotics department for custom orthotics to create stability to the foot and potentially help with her gait instability -Return to clinic with me as needed     Beth Christian, DPM [facility]  Dr. Dot Christian, DPM    [address] [place], [place] [address]                Office [phone]  Fax [phone]

## 2023-12-27 DIAGNOSIS — R2689 Other abnormalities of gait and mobility: Secondary | ICD-10-CM | POA: Diagnosis not present

## 2023-12-27 DIAGNOSIS — G2581 Restless legs syndrome: Secondary | ICD-10-CM | POA: Diagnosis not present

## 2023-12-31 DIAGNOSIS — G4733 Obstructive sleep apnea (adult) (pediatric): Secondary | ICD-10-CM | POA: Diagnosis not present

## 2024-01-14 DIAGNOSIS — G4733 Obstructive sleep apnea (adult) (pediatric): Secondary | ICD-10-CM | POA: Diagnosis not present

## 2024-01-18 DIAGNOSIS — R351 Nocturia: Secondary | ICD-10-CM | POA: Diagnosis not present

## 2024-01-18 DIAGNOSIS — R350 Frequency of micturition: Secondary | ICD-10-CM | POA: Diagnosis not present

## 2024-01-22 DIAGNOSIS — H2512 Age-related nuclear cataract, left eye: Secondary | ICD-10-CM | POA: Diagnosis not present

## 2024-01-22 DIAGNOSIS — Z0100 Encounter for examination of eyes and vision without abnormal findings: Secondary | ICD-10-CM | POA: Diagnosis not present

## 2024-01-31 DIAGNOSIS — G2581 Restless legs syndrome: Secondary | ICD-10-CM | POA: Diagnosis not present

## 2024-01-31 DIAGNOSIS — R413 Other amnesia: Secondary | ICD-10-CM | POA: Diagnosis not present

## 2024-01-31 DIAGNOSIS — R2689 Other abnormalities of gait and mobility: Secondary | ICD-10-CM | POA: Diagnosis not present

## 2024-01-31 DIAGNOSIS — G4733 Obstructive sleep apnea (adult) (pediatric): Secondary | ICD-10-CM | POA: Diagnosis not present

## 2024-01-31 DIAGNOSIS — Z8673 Personal history of transient ischemic attack (TIA), and cerebral infarction without residual deficits: Secondary | ICD-10-CM | POA: Diagnosis not present

## 2024-01-31 DIAGNOSIS — R600 Localized edema: Secondary | ICD-10-CM | POA: Diagnosis not present

## 2024-02-12 DIAGNOSIS — G4733 Obstructive sleep apnea (adult) (pediatric): Secondary | ICD-10-CM | POA: Diagnosis not present

## 2024-02-13 DIAGNOSIS — M25572 Pain in left ankle and joints of left foot: Secondary | ICD-10-CM | POA: Diagnosis not present

## 2024-02-22 ENCOUNTER — Ambulatory Visit

## 2024-02-22 NOTE — Progress Notes (Signed)
 Patient was here and fit with pair of Powersteps very happy with fit and function  Lolita Schultze CPed, CFo, CFm

## 2024-02-27 DIAGNOSIS — R413 Other amnesia: Secondary | ICD-10-CM | POA: Diagnosis not present

## 2024-02-27 DIAGNOSIS — R251 Tremor, unspecified: Secondary | ICD-10-CM | POA: Diagnosis not present

## 2024-02-27 DIAGNOSIS — G2581 Restless legs syndrome: Secondary | ICD-10-CM | POA: Diagnosis not present

## 2024-02-27 DIAGNOSIS — I10 Essential (primary) hypertension: Secondary | ICD-10-CM | POA: Diagnosis not present

## 2024-03-03 DIAGNOSIS — G4733 Obstructive sleep apnea (adult) (pediatric): Secondary | ICD-10-CM | POA: Diagnosis not present

## 2024-04-01 DIAGNOSIS — G4733 Obstructive sleep apnea (adult) (pediatric): Secondary | ICD-10-CM | POA: Diagnosis not present

## 2024-04-04 DIAGNOSIS — G3184 Mild cognitive impairment, so stated: Secondary | ICD-10-CM | POA: Diagnosis not present

## 2024-04-04 DIAGNOSIS — R350 Frequency of micturition: Secondary | ICD-10-CM | POA: Diagnosis not present

## 2024-04-04 DIAGNOSIS — R2689 Other abnormalities of gait and mobility: Secondary | ICD-10-CM | POA: Diagnosis not present

## 2024-04-04 DIAGNOSIS — G2581 Restless legs syndrome: Secondary | ICD-10-CM | POA: Diagnosis not present

## 2024-04-04 DIAGNOSIS — N3941 Urge incontinence: Secondary | ICD-10-CM | POA: Diagnosis not present

## 2024-04-16 DIAGNOSIS — R350 Frequency of micturition: Secondary | ICD-10-CM | POA: Diagnosis not present

## 2024-04-16 DIAGNOSIS — R351 Nocturia: Secondary | ICD-10-CM | POA: Diagnosis not present

## 2024-04-16 DIAGNOSIS — R3915 Urgency of urination: Secondary | ICD-10-CM | POA: Diagnosis not present

## 2024-05-05 DIAGNOSIS — H2512 Age-related nuclear cataract, left eye: Secondary | ICD-10-CM | POA: Diagnosis not present

## 2024-05-06 DIAGNOSIS — G4733 Obstructive sleep apnea (adult) (pediatric): Secondary | ICD-10-CM | POA: Diagnosis not present

## 2024-05-08 ENCOUNTER — Other Ambulatory Visit: Payer: Self-pay

## 2024-05-08 MED ORDER — HYDROCHLOROTHIAZIDE 25 MG PO TABS: ORAL_TABLET | ORAL | 1 refills | Status: AC

## 2024-06-02 DIAGNOSIS — H6123 Impacted cerumen, bilateral: Secondary | ICD-10-CM | POA: Diagnosis not present

## 2024-06-18 DIAGNOSIS — G250 Essential tremor: Secondary | ICD-10-CM | POA: Diagnosis not present

## 2024-06-18 DIAGNOSIS — F3341 Major depressive disorder, recurrent, in partial remission: Secondary | ICD-10-CM | POA: Diagnosis not present

## 2024-06-18 DIAGNOSIS — G3184 Mild cognitive impairment, so stated: Secondary | ICD-10-CM | POA: Diagnosis not present

## 2024-06-18 DIAGNOSIS — R6889 Other general symptoms and signs: Secondary | ICD-10-CM | POA: Diagnosis not present

## 2024-06-18 DIAGNOSIS — G2581 Restless legs syndrome: Secondary | ICD-10-CM | POA: Diagnosis not present

## 2024-06-18 DIAGNOSIS — R2689 Other abnormalities of gait and mobility: Secondary | ICD-10-CM | POA: Diagnosis not present

## 2024-06-26 ENCOUNTER — Other Ambulatory Visit: Payer: Self-pay | Admitting: Internal Medicine

## 2024-06-26 DIAGNOSIS — Z1231 Encounter for screening mammogram for malignant neoplasm of breast: Secondary | ICD-10-CM

## 2024-07-24 ENCOUNTER — Ambulatory Visit
Admission: RE | Admit: 2024-07-24 | Discharge: 2024-07-24 | Disposition: A | Discharge status: Home / Self Care | Source: Ambulatory Visit | Attending: Internal Medicine | Admitting: Internal Medicine

## 2024-07-24 DIAGNOSIS — Z1231 Encounter for screening mammogram for malignant neoplasm of breast: Secondary | ICD-10-CM

## 2024-07-28 ENCOUNTER — Other Ambulatory Visit: Payer: Self-pay | Admitting: Urology

## 2024-07-28 ENCOUNTER — Telehealth: Payer: Self-pay | Admitting: Cardiology

## 2024-07-28 NOTE — Telephone Encounter (Signed)
° °  [facility] Pre-operative Risk Assessment    Request for surgical clearance:  What type of surgery is being performed?  Cystoscopy with Botox and Bulkamid    When is this surgery scheduled?  08/21/24   What type of clearance is required (medical clearance vs. Pharmacy clearance to hold med vs. Both)?  Both   Are there any medications that need to be held prior to surgery and how long? Aspirin , 5 days prior   Practice name and name of physician performing surgery?  [facility]  Ronal Leeroy Shank   What is your office phone number? [phone] (ext# [phone])    7.   What is your office fax number? [phone]  8.   Anesthesia type (None, local, MAC, general)?  MAC    Francina FORBES Larve 07/28/2024, 4:46 PM  _________________________________________________________________   (provider comments below)

## 2024-07-29 ENCOUNTER — Telehealth: Payer: Self-pay

## 2024-07-29 NOTE — Telephone Encounter (Signed)
°  Patient Consent for Virtual Visit         Beth Christian has provided verbal consent on 07/29/2024 for a virtual visit (video or telephone).  Patient is scheduled for 08/16/2023. Med req and consent are complete. Call patient at [phone]  CONSENT FOR VIRTUAL VISIT FOR:  Beth Christian  By participating in this virtual visit I agree to the following:  I hereby voluntarily request, consent and authorize [facility] and its employed or contracted physicians, physician assistants, nurse practitioners or other licensed health care professionals (the Practitioner), to provide me with telemedicine health care services (the Services) as deemed necessary by the treating Practitioner. I acknowledge and consent to receive the Services by the Practitioner via telemedicine. I understand that the telemedicine visit will involve communicating with the Practitioner through live audiovisual communication technology and the disclosure of certain medical information by electronic transmission. I acknowledge that I have been given the opportunity to request an in-person assessment or other available alternative prior to the telemedicine visit and am voluntarily participating in the telemedicine visit.  I understand that I have the right to withhold or withdraw my consent to the use of telemedicine in the course of my care at any time, without affecting my right to future care or treatment, and that the Practitioner or I may terminate the telemedicine visit at any time. I understand that I have the right to inspect all information obtained and/or recorded in the course of the telemedicine visit and may receive copies of available information for a reasonable fee.  I understand that some of the potential risks of receiving the Services via telemedicine include:  Delay or interruption in medical evaluation due to technological equipment failure or disruption; Information transmitted may not be sufficient  (e.g. poor resolution of images) to allow for appropriate medical decision making by the Practitioner; and/or  In rare instances, security protocols could fail, causing a breach of personal health information.  Furthermore, I acknowledge that it is my responsibility to provide information about my medical history, conditions and care that is complete and accurate to the best of my ability. I acknowledge that Practitioner's advice, recommendations, and/or decision may be based on factors not within their control, such as incomplete or inaccurate data provided by me or distortions of diagnostic images or specimens that may result from electronic transmissions. I understand that the practice of medicine is not an exact science and that Practitioner makes no warranties or guarantees regarding treatment outcomes. I acknowledge that a copy of this consent can be made available to me via my patient portal [facility] MyChart), or I can request a printed copy by calling the office of [facility].    I understand that my insurance will be billed for this visit.   I have read or had this consent read to me. I understand the contents of this consent, which adequately explains the benefits and risks of the Services being provided via telemedicine.  I have been provided ample opportunity to ask questions regarding this consent and the Services and have had my questions answered to my satisfaction. I give my informed consent for the services to be provided through the use of telemedicine in my medical care

## 2024-07-29 NOTE — Telephone Encounter (Signed)
° °  Name: Beth Christian  DOB: 01-28-1951  MRN: [id number]  Primary Cardiologist: Oneil Parchment, MD  Chart reviewed as part of pre-operative protocol coverage. Because of M[place]PINS past medical history and time since last visit, she will require a follow-up telephone visit in order to better assess preoperative cardiovascular risk.  Pre-op covering staff: - Please schedule appointment and call patient to inform them. If patient already had an upcoming appointment within acceptable timeframe, please add pre-op clearance to the appointment notes so provider is aware. - Please contact requesting surgeon's office via preferred method (i.e, phone, fax) to inform them of need for appointment prior to surgery.  Remote stent in 2021.  As long as she is asymptomatic at the time of phone call, can hold ASA x 5 days prior to procedure and resume when medically safe to do so.  Orren LOISE Fabry, PA-C  07/29/2024, 7:40 AM

## 2024-07-29 NOTE — Telephone Encounter (Signed)
 Patient is scheduled for 08/16/2023. Med req and consent are complete. Call patient at [phone]

## 2024-08-06 NOTE — Patient Instructions (Signed)
 SURGICAL WAITING ROOM VISITATION Patients having surgery or a procedure may have no more than 2 support people in the waiting area - these visitors may rotate in the visitor waiting room.   Due to an increase in RSV and influenza rates and associated hospitalizations, children ages 20 and under may not visit patients in [facility] hospitals. If the patient needs to stay at the hospital during part of their recovery, the visitor guidelines for inpatient rooms apply.  PRE-OP VISITATION  Pre-op nurse will coordinate an appropriate time for 1 support person to accompany the patient in pre-op.  This support person may not rotate.  This visitor will be contacted when the time is appropriate for the visitor to come back in the pre-op area.  Please refer to the [facility] website for the visitor guidelines for Inpatients (after your surgery is over and you are in a regular room).  You are not required to quarantine at this time prior to your surgery. However, you must do this: Hand Hygiene often Do NOT share personal items Notify your provider if you are in close contact with someone who has COVID or you develop fever 100.4 or greater, new onset of sneezing, cough, sore throat, shortness of breath or body aches.  If you test positive for Covid or have been in contact with anyone that has tested positive in the last 10 days please notify you surgeon.    Your procedure is scheduled on:  08/21/24  Report to [facility] Main Entrance: [place] entrance where the [employer] is available.   Report to admitting at: 9:35 AM  Call this number if you have any questions or problems the morning of surgery [phone]  FOLLOW ANY ADDITIONAL PRE OP INSTRUCTIONS YOU RECEIVED FROM YOUR SURGEON'S OFFICE!!!  Do not eat food or drink fluids after Midnight the night prior to your surgery/procedure.   Oral Hygiene is also important to reduce your risk of infection.        Remember - BRUSH YOUR TEETH  THE MORNING OF SURGERY WITH YOUR REGULAR TOOTHPASTE  Do NOT smoke after Midnight the night before surgery.  STOP TAKING all Vitamins, Herbs and supplements 1 week before your surgery.   Take ONLY these medicines the morning of surgery with A SIP OF WATER: gabapentin,bupropion ,citalopram ,propranolol,amlodipine ,levothyroxine ,myrbetriq. Tylenol ,clonazepam  as needed.                   You may not have any metal on your body including hair pins, jewelry, and body piercing  Do not wear make-up, lotions, powders, perfumes / cologne, or deodorant  Do not wear nail polish including gel and S&S, artificial / acrylic nails, or any other type of covering on natural nails including finger and toenails. If you have artificial nails, gel coating, etc., that needs to be removed by a nail salon, Please have this removed prior to surgery. Not doing so may mean that your surgery could be cancelled or delayed if the Surgeon or anesthesia staff feels like they are unable to monitor you safely.   Do not shave 48 hours prior to surgery to avoid nicks in your skin which may contribute to postoperative infections.   Contacts, Hearing Aids, dentures or bridgework may not be worn into surgery. DENTURES WILL BE REMOVED PRIOR TO SURGERY PLEASE DO NOT APPLY Poly grip OR ADHESIVES!!!  You may bring a small overnight bag with you on the day of surgery, only pack items that are not valuable. [facility] IS NOT  RESPONSIBLE   FOR VALUABLES THAT ARE LOST OR STOLEN.   Patients discharged on the day of surgery will not be allowed to drive home.  Someone NEEDS to stay with you for the first 24 hours after anesthesia.  Do not bring your home medications to the hospital. The Pharmacy will dispense medications listed on your medication list to you during your admission in the Hospital.  Special Instructions: Bring a copy of your healthcare power of attorney and living will documents the day of surgery, if you wish to have them  scanned into your [facility] Records- EPIC  Please read over the following fact sheets you were given: IF YOU HAVE QUESTIONS ABOUT YOUR PRE-OP INSTRUCTIONS, PLEASE CALL [phone]   [facility] - Preparing for Surgery      Before surgery, you can play an important role.  Because skin is not sterile, your skin needs to be as free of germs as possible.  You can reduce the number of germs on your skin by washing with CHG (chlorahexidine gluconate) soap before surgery.  CHG is an antiseptic cleaner which kills germs and bonds with the skin to continue killing germs even after washing. Please DO NOT use if you have an allergy to CHG or antibacterial soaps.  If your skin becomes reddened/irritated stop using the CHG and inform your nurse when you arrive at Short Stay. Do not shave (including legs and underarms) for at least 48 hours prior to the first CHG shower.  You may shave your face/neck.  Please follow these instructions carefully:  1.  Shower with CHG Soap the night before surgery ONLY (DO NOT USE THE SOAP THE MORNING OF SURGERY).  2.  If you choose to wash your hair, wash your hair first as usual with your normal  shampoo.  3.  After you shampoo, rinse your hair and body thoroughly to remove the shampoo.                             4.  Use CHG as you would any other liquid soap.  You can apply chg directly to the skin and wash.  Gently with a scrungie or clean washcloth.  5.  Apply the CHG Soap to your body ONLY FROM THE NECK DOWN.   Do not use on face/ open                           Wound or open sores. Avoid contact with eyes, ears mouth and genitals (private parts).                       Wash face,  Genitals (private parts) with your normal soap.             6.  Wash thoroughly, paying special attention to the area where your  surgery  will be performed.  7.  Thoroughly rinse your body with warm water from the neck down.  8.  DO NOT shower/wash with your normal soap after using and  rinsing off the CHG Soap.                9.  Pat yourself dry with a clean towel.            10.  Wear clean pajamas.            11.  Place clean sheets on your bed  the night of your first shower and do not  sleep with pets.  Day of Surgery : Do not apply any CHG, lotions/deodorants the morning of surgery.  Please wear clean clothes to the hospital/surgery center.   FAILURE TO FOLLOW THESE INSTRUCTIONS MAY RESULT IN THE CANCELLATION OF YOUR SURGERY  PATIENT SIGNATURE_________________________________  NURSE SIGNATURE__________________________________  ________________________________________________________________________

## 2024-08-11 ENCOUNTER — Encounter (HOSPITAL_COMMUNITY): Payer: Self-pay

## 2024-08-11 ENCOUNTER — Other Ambulatory Visit: Payer: Self-pay

## 2024-08-11 ENCOUNTER — Encounter (HOSPITAL_COMMUNITY)
Admission: RE | Admit: 2024-08-11 | Discharge: 2024-08-11 | Disposition: A | Source: Ambulatory Visit | Attending: Urology | Admitting: Urology

## 2024-08-11 VITALS — BP 130/52 | HR 58 | Temp 98.6°F | Ht 63.0 in | Wt 160.0 lb

## 2024-08-11 DIAGNOSIS — Z01818 Encounter for other preprocedural examination: Secondary | ICD-10-CM | POA: Insufficient documentation

## 2024-08-11 DIAGNOSIS — I10 Essential (primary) hypertension: Secondary | ICD-10-CM | POA: Insufficient documentation

## 2024-08-11 LAB — CBC
HCT: 40.2 % (ref 36.0–46.0)
Hemoglobin: 13.1 g/dL (ref 12.0–15.0)
MCH: 31.1 pg (ref 26.0–34.0)
MCHC: 32.6 g/dL (ref 30.0–36.0)
MCV: 95.5 fL (ref 80.0–100.0)
Platelets: 258 K/uL (ref 150–400)
RBC: 4.21 MIL/uL (ref 3.87–5.11)
RDW: 12.4 % (ref 11.5–15.5)
WBC: 5.8 K/uL (ref 4.0–10.5)
nRBC: 0 % (ref 0.0–0.2)

## 2024-08-11 LAB — BASIC METABOLIC PANEL WITH GFR
Anion gap: 9 (ref 5–15)
BUN: 17 mg/dL (ref 8–23)
CO2: 30 mmol/L (ref 22–32)
Calcium: 8.3 mg/dL — ABNORMAL LOW (ref 8.9–10.3)
Chloride: 103 mmol/L (ref 98–111)
Creatinine, Ser: 1.08 mg/dL — ABNORMAL HIGH (ref 0.44–1.00)
GFR, Estimated: 54 mL/min — ABNORMAL LOW
Glucose, Bld: 69 mg/dL — ABNORMAL LOW (ref 70–99)
Potassium: 4.9 mmol/L (ref 3.5–5.1)
Sodium: 142 mmol/L (ref 135–145)

## 2024-08-11 NOTE — Progress Notes (Signed)
 For Anesthesia: PCP - Alys [name] HERO, PA  Cardiologist - Jeffrie Oneil BROCKS, MD  Clearance: Pending: telephone appointment on: 08/15/24 Bowel Prep reminder:  Chest x-ray -  EKG - 12/10/23 Stress Test -  ECHO - 01/15/23 Cardiac Cath - 2012 Pacemaker/ICD device last checked: Pacemaker orders received: Device Rep notified:  Spinal Cord Stimulator:N/A  Sleep Study - Yes CPAP - N/A  Fasting Blood Sugar - N/A Checks Blood Sugar _____ times a day Date and result of last Hgb A1c-  Last dose of GLP1 agonist- N/A GLP1 instructions: Hold 7 days prior to schedule (Hold 24 hours-daily)   Last dose of SGLT-2 inhibitors- N/A SGLT-2 instructions: Hold 72 hours prior to surgery  Blood Thinner Instructions:N/A Last Dose: Time last taken:  Aspirin  Instructions: Last Dose: Time last taken:  Activity level: Can go up a flight of stairs and activities of daily living without stopping and without chest pain and/or shortness of breath   Able to exercise without chest pain and/or shortness of breath  Anesthesia review: Hx: HTN,CVA,OSA(NO CPAP)  Patient denies shortness of breath, fever, cough and chest pain at PAT appointment   Patient verbalized understanding of instructions that were reviewed over the telephone.

## 2024-08-15 ENCOUNTER — Ambulatory Visit: Attending: Cardiology

## 2024-08-15 DIAGNOSIS — Z01810 Encounter for preprocedural cardiovascular examination: Secondary | ICD-10-CM

## 2024-08-15 NOTE — Progress Notes (Signed)
 "   Virtual Visit via Telephone Note   Because of Beth Christian co-morbid illnesses, she is at least at moderate risk for complications without adequate follow up.  This format is felt to be most appropriate for this patient at this time.  Due to technical limitations with video connection web designer), today's appointment will be conducted as an audio only telehealth visit, and Beth Christian verbally agreed to proceed in this manner.   All issues noted in this document were discussed and addressed.  No physical exam could be performed with this format.  Evaluation Performed:  Preoperative cardiovascular risk assessment _____________   Date:  08/15/2024   Patient ID:  Beth Christian, DOB 03-25-1951, MRN [id number] Patient Location:  Home Provider location:   Office  Primary Care Provider:  Alys [name] HERO, PA Primary Cardiologist:  Oneil Parchment, MD  Chief Complaint / Patient Profile   74 y.o. y/o female with a h/o CAD with RCA DES 2012, procedure related embolic stroke, hypertension, and hyperlipidemia, OSA,  who is pending Cystoscopy with Botox and Bulkamid scheduled on 08/21/2024 and presents today for telephonic preoperative cardiovascular risk assessment.  History of Present Illness    Beth Christian is a 74 y.o. female who presents via audio/video conferencing for a telehealth visit today.  Pt was last seen in cardiology clinic on 12/10/2023 by Dr. Parchment.  At that time Beth Christian was doing well.  The patient is now pending procedure as outlined above. Since her last visit, She denies chest pain, palpitations, dyspnea, orthopnea, n, v, dark/tarry/bloody stools, hematuria, dizziness, syncope, weight gain. She does report some mild lower extremity edema that is unchanged from her baseline, resolves with elevation.  Past Medical History    Past Medical History:  Diagnosis Date   Acrocyanosis    Articular cartilage disorder involving shoulder region, left 12/2016   Coronary  artery disease involving native coronary artery 2012   DES RCA x 2 (most recently for ISR)   CVA (cerebral infarction) 01/25/2011   during heart cath - no residual deficits   Depression    DJD (degenerative joint disease)    Gait disorder    Grinding of teeth    History of CVA (cerebrovascular accident)    HTN (hypertension)    states under control with meds., has been on med. x 6 mos.   Hypothyroidism    Immature cataract of both eyes    Memory loss    Multiple falls    OSA (obstructive sleep apnea)    Osteoarthritis of shoulder    left   Osteopenia    Poor balance    Raynaud's disease    RLS (restless legs syndrome)    Rotator cuff tear, left 12/2016   Stress incontinence    Stroke [facility])    Thoracic aortic atherosclerosis    Tinnitus    TMJ dysfunction    Urinary, incontinence, stress female    Wears hearing aid in both ears    Past Surgical History:  Procedure Laterality Date   BREAST BIOPSY Right 09/15/2014   stromal hyalinization and small benign fibroadenoma   COLONOSCOPY WITH PROPOFOL  N/A 12/02/2012   Procedure: COLONOSCOPY WITH PROPOFOL ;  Surgeon: Gladis MARLA Louder, MD;  Location: [facility];  Service: Endoscopy;  Laterality: N/A;   CORONARY ANGIOPLASTY WITH STENT PLACEMENT  01/25/2011   proximal RCA   LEFT HEART CATHETERIZATION WITH CORONARY ANGIOGRAM N/A 07/07/2011   Procedure: LEFT HEART CATHETERIZATION WITH CORONARY ANGIOGRAM;  Surgeon: Victory ORN  Claudene MOULD, MD;  Location: [facility] CATH LAB;  Service: Cardiovascular;  Laterality: N/A;  Femoral   ORIF DISTAL RADIUS FRACTURE Right 06/11/2012   PERCUTANEOUS CORONARY STENT INTERVENTION (PCI-S) N/A 07/07/2011   Procedure: PERCUTANEOUS CORONARY STENT INTERVENTION (PCI-S);  Surgeon: Victory LELON Claudene MOULD, MD;  Location[facility] CATH LAB;  Service: Cardiovascular;  Laterality: N/A;   SHOULDER CLOSED REDUCTION Left 01/11/2017   Procedure: CLOSED MANIPULATION SHOULDER;  Surgeon: Beverley Toribio FALCON, MD;  Location: [facility];  Service: Orthopedics;  Laterality: Left;   TENDON REPAIR Left    thumb   TONSILLECTOMY     as child   TURP/cystoscopy, [facility]     WRIST SURGERY Right     Allergies  Allergies[1]  Home Medications    Prior to Admission medications  Medication Sig Start Date End Date Taking? Authorizing Provider  acetaminophen  (TYLENOL ) 325 MG tablet Take 650 mg by mouth every 6 (six) hours as needed (Pain).    [provider]  alendronate (FOSAMAX) 70 MG tablet Take 70 mg by mouth once a week.    [provider]  amLODipine  (NORVASC ) 5 MG tablet Take 5 mg by mouth daily.    [provider]  aspirin  EC 81 MG tablet Take 81 mg by mouth daily.    [provider]  azelastine (ASTELIN) 0.1 % nasal spray Place 1 spray into both nostrils 2 (two) times daily as needed for rhinitis or allergies.    [provider]  buPROPion  (WELLBUTRIN  XL) 150 MG 24 hr tablet Take 150 mg by mouth 3 (three) times daily.    [provider]  citalopram  (CELEXA ) 20 MG tablet Take 20 mg by mouth daily.    [provider]  clonazePAM  (KLONOPIN ) 0.5 MG tablet Take 0.25 mg by mouth every other day. 06/18/22   [provider]  donepezil (ARICEPT) 10 MG tablet Take 10 mg by mouth at bedtime.    [provider]  gabapentin (NEURONTIN) 100 MG capsule Take 100 mg by mouth daily.    [provider]  gabapentin (NEURONTIN) 300 MG capsule Take 300 mg by mouth daily. 12/27/23   [provider]  hydrochlorothiazide  (HYDRODIURIL ) 25 MG tablet TAKE 1 TABLET (25 MG TOTAL) BY MOUTH DAILY. 05/08/24   Jeffrie Oneil BROCKS, MD  levothyroxine  (SYNTHROID , LEVOTHROID) 75 MCG tablet Take 75 mcg by mouth daily.    [provider]  MYRBETRIQ 50 MG TB24 tablet Take 50 mg by mouth daily. 06/14/22   [provider]  propranolol (INDERAL) 20 MG tablet Take 20 mg by mouth 2 (two) times daily. 06/18/24   [provider]  Propylene  Glycol-Glycerin (ARTIFICIAL TEARS) 1-0.3 % SOLN Place 1 drop into both eyes as needed (Dry eye).    [provider]  rosuvastatin  (CRESTOR ) 10 MG tablet TAKE 1 TABLET (10 MG TOTAL) BY MOUTH AT BEDTIME. *REFILL 09/11/16* 09/04/17   Claudene Victory LELON, MD  VITAMIN D  PO Take 5,000 Units by mouth.    [provider]    Physical Exam    Vital Signs:  Beth Christian does not have vital signs available for review today.  Given telephonic nature of communication, physical exam is limited. AAOx3. NAD. Normal affect.  Speech and respirations are unlabored.  Accessory Clinical Findings    None  Assessment & Plan    1.  Preoperative Cardiovascular Risk Assessment: According to the Revised Cardiac Risk Index (RCRI), her Perioperative Risk of Major Cardiac Event is (%): 6.6  Her Functional Capacity in METs is: 4.73 according to the Duke Activity Status Index (DASI). Therefore, based on ACC/AHA guidelines, patient would be at moderate-high but acceptable risk for the planned procedure without further cardiovascular testing. I will route this recommendation to the requesting party via Epic fax function.  The patient was advised that if she develops new symptoms prior to surgery to contact our office to arrange for a follow-up visit, and she verbalized understanding.  Regarding ASA therapy, we recommend continuation of ASA throughout the perioperative period. However, if the surgeon feels that cessation of ASA is required in the perioperative period, it may be stopped 5 days prior to surgery with a plan to resume it as soon as felt to be feasible from a surgical standpoint in the post-operative period.   A copy of this note will be routed to requesting surgeon.  Time:   Today, I have spent 10 minutes with the patient with telehealth technology discussing medical history, symptoms, and management plan.     Sojourner Behringer E Kwadwo Taras, NP  08/15/2024, 8:28 AM     [1]  Allergies Allergen  Reactions   Latex Itching and Rash   "

## 2024-08-18 NOTE — Anesthesia Preprocedure Evaluation (Addendum)
 "                                  Anesthesia Evaluation  Patient identified by MRN, date of birth, ID band Patient awake    Reviewed: Allergy & Precautions, H&P , NPO status , Patient's Chart, lab work & pertinent test results, Unable to perform ROS - Chart review only  Airway Mallampati: III  TM Distance: >3 FB Neck ROM: Full    Dental no notable dental hx. (+) Teeth Intact, Caps, Implants, Poor Dentition,    Pulmonary sleep apnea , former smoker   Pulmonary exam normal breath sounds clear to auscultation       Cardiovascular Exercise Tolerance: Good hypertension, Pt. on medications + CAD  Normal cardiovascular exam Rhythm:Regular Rate:Normal  Acrocyanosis Drug eluding Stents x 2 RCA 2012   Neuro/Psych  PSYCHIATRIC DISORDERS  Depression    Restless legs syndrome Bilateral hearing deficits CVA, No Residual Symptoms    GI/Hepatic negative GI ROS, Neg liver ROS,,,  Endo/Other  Hypothyroidism  Hyperlipidemia  Renal/GU negative Renal ROS   SUI negative genitourinary   Musculoskeletal  (+) Arthritis ,  OA left shoulder Rotator cuff tear left shoulder   Abdominal   Peds  Hematology negative hematology ROS (+)   Anesthesia Other Findings   Reproductive/Obstetrics negative OB ROS                              Anesthesia Physical Anesthesia Plan  ASA: 3  Anesthesia Plan: MAC   Post-op Pain Management: Tylenol  PO (pre-op)* and Celebrex  PO (pre-op)*   Induction: Intravenous  PONV Risk Score and Plan: 2 and Ondansetron  and Propofol  infusion  Airway Management Planned: Natural Airway, Simple Face Mask and Mask  Additional Equipment: None  Intra-op Plan:   Post-operative Plan:   Informed Consent: I have reviewed the patients History and Physical, chart, labs and discussed the procedure including the risks, benefits and alternatives for the proposed anesthesia with the patient or authorized representative who has  indicated his/her understanding and acceptance.     Dental advisory given  Plan Discussed with: Anesthesiologist  Anesthesia Plan Comments: (See PAT note from 12/29   DISCUSSION: Beth Christian is a 74 yo female with past medical history of former smoking, HTN, CAD s/p DES to RCA (2012), post procedural CVA in 2012, memory impairment, RLS, OSA (no CPAP), hypothyroid, arthritis.   Patient follows with cardiology for history of CAD and mild-mod aortic regurgitation.  She underwent DES to the RCA and 2012.  Procedure complicated by embolic stroke but reportedly has no significant residual deficits. Last seen by Dr. Jeffrie on 12/10/2023. Last echo in 01/2023 showed normal LVEF with mild AI. Advised repeat echo in 5 years. And f/u in 2 years.  She had cardiac clearance televisit on 08/15/2024 and was cleared for surgery:   Preoperative Cardiovascular Risk Assessment: According to the Revised Cardiac Risk Index (RCRI), her Perioperative Risk of Major Cardiac Event is (%): 6.6 Her Functional Capacity in METs is: 4.73 according to the Duke Activity Status Index (DASI). Therefore, based on ACC/AHA guidelines, patient would be at moderate-high but acceptable risk for the planned procedure without further cardiovascular testing. I will route this recommendation to the requesting party via Epic fax function.   Patient follows with neurology for gait disturbance, recurrent falls, RLS, memory impairment that started around 2021.  Last seen  by neurology on 2//2025.  She was recommended to undergo a sleep study which she has not yet done. She does have prior dx of OSA.   VS: BP (!) 130/52   Pulse (!) 58   Temp 37 C (Oral)   Ht 5' 3 (1.6 m)   Wt 72.6 kg   SpO2 96%   BMI 28.34 kg/m   EKG 12/10/23:   Normal sinus rhythm     Echo 01/15/2023:   IMPRESSIONS      1. Left ventricular ejection fraction, by estimation, is 60 to 65%. The left ventricle has normal function. The left ventricle has no  regional wall motion abnormalities. Left ventricular diastolic parameters are indeterminate.  2. Right ventricular systolic function is normal. The right ventricular size is normal. There is normal pulmonary artery systolic pressure. The estimated right ventricular systolic pressure is 18.8 mmHg.  3. The mitral valve is degenerative. Trivial mitral valve regurgitation. No evidence of mitral stenosis. Moderate mitral annular calcification.  4. The aortic valve was not well visualized. Aortic valve regurgitation is mild. No aortic stenosis is present.  5. The inferior vena cava is normal in size with greater than 50% respiratory variability, suggesting right atrial pressure of 3 mmHg.  )         Anesthesia Quick Evaluation  "

## 2024-08-18 NOTE — Progress Notes (Signed)
 " Case: [id number] Date/Time: 08/21/24 1135   Procedures:      CYSTOSCOPY, WITH INJECTION OF BLADDER NECK OR BLADDER WALL - CYSTOSCOPY WITH BOTOX AND BULKAMID     INJECTION, BULKING AGENT, URETHRA   Anesthesia type: Monitor Anesthesia Care   Diagnosis: Mixed incontinence [N39.46]   Pre-op diagnosis: MIXED FEMALE URINARY INCONTINENCE   [facility]   Surgeons: Elisabeth Valli BIRCH, MD       DISCUSSION: Beth Christian is a 74 yo female with past medical history of former smoking, HTN, CAD s/p DES to RCA (2012), post procedural CVA in 2012, memory impairment, RLS, OSA (no CPAP), hypothyroid, arthritis.  Patient follows with cardiology for history of CAD and mild-mod aortic regurgitation.  She underwent DES to the RCA and 2012.  Procedure complicated by embolic stroke but reportedly has no significant residual deficits. Last seen by Dr. Jeffrie on 12/10/2023. Last echo in 01/2023 showed normal LVEF with mild AI. Advised repeat echo in 5 years. And f/u in 2 years.  She had cardiac clearance televisit on 08/15/2024 and was cleared for surgery:  Preoperative Cardiovascular Risk Assessment: According to the Revised Cardiac Risk Index (RCRI), her Perioperative Risk of Major Cardiac Event is (%): 6.6 Her Functional Capacity in METs is: 4.73 according to the Duke Activity Status Index (DASI). Therefore, based on ACC/AHA guidelines, patient would be at moderate-high but acceptable risk for the planned procedure without further cardiovascular testing. I will route this recommendation to the requesting party via Epic fax function.  Patient follows with neurology for gait disturbance, recurrent falls, RLS, memory impairment that started around 2021.  Last seen by neurology on 2//2025.  She was recommended to undergo a sleep study which she has not yet done. She does have prior dx of OSA.  VS: BP (!) 130/52   Pulse (!) 58   Temp 37 C (Oral)   Ht 5' 3 (1.6 m)   Wt 72.6 kg   SpO2 96%    BMI 28.34 kg/m   PROVIDERS: [place], [name] HERO, PA   LABS: Labs reviewed: Acceptable for surgery. (all labs ordered are listed, but only abnormal results are displayed)  Labs Reviewed  BASIC METABOLIC PANEL WITH GFR - Abnormal; Notable for the following components:      Result Value   Glucose, Bld 69 (*)    Creatinine, Ser 1.08 (*)    Calcium  8.3 (*)    GFR, Estimated 54 (*)    All other components within normal limits  CBC     EKG 12/10/23:  Normal sinus rhythm   Echo 01/15/2023:  IMPRESSIONS    1. Left ventricular ejection fraction, by estimation, is 60 to 65%. The left ventricle has normal function. The left ventricle has no regional wall motion abnormalities. Left ventricular diastolic parameters are indeterminate.  2. Right ventricular systolic function is normal. The right ventricular size is normal. There is normal pulmonary artery systolic pressure. The estimated right ventricular systolic pressure is 18.8 mmHg.  3. The mitral valve is degenerative. Trivial mitral valve regurgitation. No evidence of mitral stenosis. Moderate mitral annular calcification.  4. The aortic valve was not well visualized. Aortic valve regurgitation is mild. No aortic stenosis is present.  5. The inferior vena cava is normal in size with greater than 50% respiratory variability, suggesting right atrial pressure of 3 mmHg.   Past Medical History:  Diagnosis Date   Acrocyanosis    Articular cartilage disorder involving shoulder region, left 12/2016  Coronary artery disease involving native coronary artery 2012   DES RCA x 2 (most recently for ISR)   CVA (cerebral infarction) 01/25/2011   during heart cath - no residual deficits   Depression    DJD (degenerative joint disease)    Gait disorder    Grinding of teeth    History of CVA (cerebrovascular accident)    HTN (hypertension)    states under control with meds., has been on med. x 6 mos.   Hypothyroidism    Immature  cataract of both eyes    Memory loss    Multiple falls    OSA (obstructive sleep apnea)    Osteoarthritis of shoulder    left   Osteopenia    Poor balance    Raynaud's disease    RLS (restless legs syndrome)    Rotator cuff tear, left 12/2016   Stress incontinence    Stroke [facility])    Thoracic aortic atherosclerosis    Tinnitus    TMJ dysfunction    Urinary, incontinence, stress female    Wears hearing aid in both ears     Past Surgical History:  Procedure Laterality Date   BREAST BIOPSY Right 09/15/2014   stromal hyalinization and small benign fibroadenoma   COLONOSCOPY WITH PROPOFOL  N/A 12/02/2012   Procedure: COLONOSCOPY WITH PROPOFOL ;  Surgeon: Gladis MARLA Louder, MD;  Location: [facility];  Service: Endoscopy;  Laterality: N/A;   CORONARY ANGIOPLASTY WITH STENT PLACEMENT  01/25/2011   proximal RCA   LEFT HEART CATHETERIZATION WITH CORONARY ANGIOGRAM N/A 07/07/2011   Procedure: LEFT HEART CATHETERIZATION WITH CORONARY ANGIOGRAM;  Surgeon: Victory LELON Claudene DOUGLAS, MD;  Location: [facility] CATH LAB;  Service: Cardiovascular;  Laterality: N/A;  Femoral   ORIF DISTAL RADIUS FRACTURE Right 06/11/2012   PERCUTANEOUS CORONARY STENT INTERVENTION (PCI-S) N/A 07/07/2011   Procedure: PERCUTANEOUS CORONARY STENT INTERVENTION (PCI-S);  Surgeon: Victory LELON Claudene DOUGLAS, MD;  Location: [facility] CATH LAB;  Service: Cardiovascular;  Laterality: N/A;   SHOULDER CLOSED REDUCTION Left 01/11/2017   Procedure: CLOSED MANIPULATION SHOULDER;  Surgeon: Beverley Toribio FALCON, MD;  Location:  SURGERY CENTER;  Service: Orthopedics;  Laterality: Left;   TENDON REPAIR Left    thumb   TONSILLECTOMY     as child   TURP/cystoscopy, [facility]     WRIST SURGERY Right     MEDICATIONS:  acetaminophen  (TYLENOL ) 325 MG tablet   alendronate (FOSAMAX) 70 MG tablet   amLODipine  (NORVASC ) 5 MG tablet   aspirin  EC 81 MG tablet   azelastine (ASTELIN) 0.1 % nasal spray   buPROPion  (WELLBUTRIN  XL) 150 MG 24 hr tablet   citalopram   (CELEXA ) 20 MG tablet   clonazePAM  (KLONOPIN ) 0.5 MG tablet   donepezil (ARICEPT) 10 MG tablet   gabapentin (NEURONTIN) 100 MG capsule   gabapentin (NEURONTIN) 300 MG capsule   hydrochlorothiazide  (HYDRODIURIL ) 25 MG tablet   levothyroxine  (SYNTHROID , LEVOTHROID) 75 MCG tablet   MYRBETRIQ 50 MG TB24 tablet   propranolol (INDERAL) 20 MG tablet   Propylene Glycol-Glycerin (ARTIFICIAL TEARS) 1-0.3 % SOLN   rosuvastatin  (CRESTOR ) 10 MG tablet   VITAMIN D  PO   No current facility-administered medications for this encounter.   Burnard CHRISTELLA Odis DEVONNA [facility] Short Stay/Anesthesiology [facility] Phone [phone] 08/18/2024 10:12 AM       "

## 2024-08-21 ENCOUNTER — Encounter (HOSPITAL_COMMUNITY): Payer: Self-pay | Admitting: Urology

## 2024-08-21 ENCOUNTER — Ambulatory Visit (HOSPITAL_COMMUNITY): Payer: Self-pay | Admitting: Certified Registered"

## 2024-08-21 ENCOUNTER — Encounter (HOSPITAL_COMMUNITY)
Admission: RE | Disposition: A | Discharge status: Home / Self Care | Payer: Self-pay | Source: Ambulatory Visit | Attending: Urology

## 2024-08-21 ENCOUNTER — Other Ambulatory Visit: Payer: Self-pay

## 2024-08-21 ENCOUNTER — Ambulatory Visit (HOSPITAL_COMMUNITY): Payer: Self-pay | Admitting: Medical

## 2024-08-21 ENCOUNTER — Ambulatory Visit (HOSPITAL_COMMUNITY)
Admission: RE | Admit: 2024-08-21 | Discharge: 2024-08-21 | Disposition: A | Discharge status: Home / Self Care | Source: Ambulatory Visit | Attending: Urology | Admitting: Urology

## 2024-08-21 DIAGNOSIS — N3946 Mixed incontinence: Secondary | ICD-10-CM

## 2024-08-21 DIAGNOSIS — Z79899 Other long term (current) drug therapy: Secondary | ICD-10-CM | POA: Diagnosis not present

## 2024-08-21 DIAGNOSIS — G4730 Sleep apnea, unspecified: Secondary | ICD-10-CM | POA: Diagnosis not present

## 2024-08-21 DIAGNOSIS — G4733 Obstructive sleep apnea (adult) (pediatric): Secondary | ICD-10-CM | POA: Diagnosis not present

## 2024-08-21 DIAGNOSIS — E039 Hypothyroidism, unspecified: Secondary | ICD-10-CM | POA: Diagnosis not present

## 2024-08-21 DIAGNOSIS — Z87891 Personal history of nicotine dependence: Secondary | ICD-10-CM | POA: Insufficient documentation

## 2024-08-21 DIAGNOSIS — I10 Essential (primary) hypertension: Secondary | ICD-10-CM | POA: Diagnosis not present

## 2024-08-21 DIAGNOSIS — Z955 Presence of coronary angioplasty implant and graft: Secondary | ICD-10-CM | POA: Insufficient documentation

## 2024-08-21 DIAGNOSIS — I2510 Atherosclerotic heart disease of native coronary artery without angina pectoris: Secondary | ICD-10-CM | POA: Insufficient documentation

## 2024-08-21 HISTORY — PX: CYSTOSCOPY WITH INJECTION: SHX1424

## 2024-08-21 HISTORY — PX: INJECTION, BULKING AGENT, URETHRA: SHX7596

## 2024-08-21 SURGERY — CYSTOSCOPY, WITH INJECTION OF BLADDER NECK OR BLADDER WALL
Anesthesia: Monitor Anesthesia Care | Site: Bladder

## 2024-08-21 MED ORDER — ORAL CARE MOUTH RINSE: 15.0000 mL | Freq: Once | OROMUCOSAL | Status: AC

## 2024-08-21 MED ORDER — FENTANYL CITRATE (PF) 100 MCG/2ML IJ SOLN
INTRAMUSCULAR | Status: DC | PRN
  Administered 2024-08-21: 25 ug via INTRAVENOUS

## 2024-08-21 MED ORDER — LIDOCAINE HCL (PF) 2 % IJ SOLN
INTRAMUSCULAR | Status: DC | PRN
  Administered 2024-08-21: 100 mg via INTRADERMAL

## 2024-08-21 MED ORDER — ONDANSETRON HCL 4 MG/2ML IJ SOLN: 4.0000 mg | Freq: Once | INTRAMUSCULAR | Status: DC | PRN

## 2024-08-21 MED ORDER — ACETAMINOPHEN 500 MG PO TABS
1000.0000 mg | ORAL_TABLET | Freq: Once | ORAL | Status: AC
  Administered 2024-08-21: 1000 mg via ORAL
  Filled 2024-08-21: qty 2

## 2024-08-21 MED ORDER — ONDANSETRON HCL 4 MG/2ML IJ SOLN
INTRAMUSCULAR | Status: DC | PRN
  Administered 2024-08-21: 4 mg via INTRAVENOUS

## 2024-08-21 MED ORDER — OXYCODONE HCL 5 MG/5ML PO SOLN: 5.0000 mg | Freq: Once | ORAL | Status: DC | PRN

## 2024-08-21 MED ORDER — DEXAMETHASONE SOD PHOSPHATE PF 10 MG/ML IJ SOLN
INTRAMUSCULAR | Status: AC
  Filled 2024-08-21: qty 1

## 2024-08-21 MED ORDER — OXYCODONE HCL 5 MG PO TABS: 5.0000 mg | ORAL_TABLET | Freq: Once | ORAL | Status: DC | PRN

## 2024-08-21 MED ORDER — PROPOFOL 10 MG/ML IV BOLUS
INTRAVENOUS | Status: AC
  Filled 2024-08-21: qty 20

## 2024-08-21 MED ORDER — LIDOCAINE HCL (PF) 2 % IJ SOLN
INTRAMUSCULAR | Status: AC
  Filled 2024-08-21: qty 5

## 2024-08-21 MED ORDER — ONABOTULINUMTOXINA 100 UNITS IJ SOLR
INTRAMUSCULAR | Status: DC | PRN
  Administered 2024-08-21: 100 [IU] via INTRAMUSCULAR

## 2024-08-21 MED ORDER — PROPOFOL 500 MG/50ML IV EMUL
INTRAVENOUS | Status: DC | PRN
  Administered 2024-08-21: 125 ug/kg/min via INTRAVENOUS

## 2024-08-21 MED ORDER — ONABOTULINUMTOXINA 100 UNITS IJ SOLR
INTRAMUSCULAR | Status: AC
  Filled 2024-08-21: qty 200

## 2024-08-21 MED ORDER — FENTANYL CITRATE (PF) 100 MCG/2ML IJ SOLN
INTRAMUSCULAR | Status: AC
  Filled 2024-08-21: qty 2

## 2024-08-21 MED ORDER — PROPOFOL 10 MG/ML IV BOLUS
INTRAVENOUS | Status: DC | PRN
  Administered 2024-08-21: 30 mg via INTRAVENOUS

## 2024-08-21 MED ORDER — SODIUM CHLORIDE (PF) 0.9 % IJ SOLN
INTRAMUSCULAR | Status: AC
  Filled 2024-08-21: qty 20

## 2024-08-21 MED ORDER — LACTATED RINGERS IV SOLN: INTRAVENOUS | Status: DC

## 2024-08-21 MED ORDER — ONDANSETRON HCL 4 MG/2ML IJ SOLN
INTRAMUSCULAR | Status: AC
  Filled 2024-08-21: qty 2

## 2024-08-21 MED ORDER — CHLORHEXIDINE GLUCONATE 0.12 % MT SOLN
15.0000 mL | Freq: Once | OROMUCOSAL | Status: AC
  Administered 2024-08-21: 15 mL via OROMUCOSAL

## 2024-08-21 MED ORDER — FENTANYL CITRATE (PF) 50 MCG/ML IJ SOSY: 25.0000 ug | PREFILLED_SYRINGE | INTRAMUSCULAR | Status: DC | PRN

## 2024-08-21 MED ORDER — CELECOXIB 200 MG PO CAPS
200.0000 mg | ORAL_CAPSULE | Freq: Once | ORAL | Status: AC
  Administered 2024-08-21: 200 mg via ORAL
  Filled 2024-08-21: qty 1

## 2024-08-21 MED ORDER — DEXAMETHASONE SOD PHOSPHATE PF 10 MG/ML IJ SOLN
INTRAMUSCULAR | Status: DC | PRN
  Administered 2024-08-21: 4 mg via INTRAVENOUS

## 2024-08-21 MED ORDER — WATER FOR IRRIGATION, STERILE IR SOLN
Status: DC | PRN
  Administered 2024-08-21: 3000 mL

## 2024-08-21 MED ORDER — CEFAZOLIN SODIUM-DEXTROSE 2-4 GM/100ML-% IV SOLN
2.0000 g | INTRAVENOUS | Status: AC
  Administered 2024-08-21: 2 g via INTRAVENOUS
  Filled 2024-08-21: qty 100

## 2024-08-21 MED ORDER — LIDOCAINE 2% (20 MG/ML) 5 ML SYRINGE: INTRAMUSCULAR | Status: DC | PRN

## 2024-08-21 MED ORDER — MEPERIDINE HCL 25 MG/ML IJ SOLN: 6.2500 mg | INTRAMUSCULAR | Status: DC | PRN

## 2024-08-21 MED ORDER — ONABOTULINUMTOXINA 100 UNITS IJ SOLR: 100.0000 [IU] | Freq: Once | INTRAMUSCULAR | Status: DC

## 2024-08-21 SURGICAL SUPPLY — 17 items
BAG URO CATCHER STRL LF (MISCELLANEOUS) ×1 IMPLANT
CLOTH BEACON ORANGE TIMEOUT ST (SAFETY) ×1 IMPLANT
ELECT REM PT RETURN 15FT ADLT (MISCELLANEOUS) ×1 IMPLANT
GLOVE BIO SURGEON STRL SZ 6.5 (GLOVE) ×1 IMPLANT
GLOVE BIOGEL M STRL SZ7.5 (GLOVE) ×1 IMPLANT
GOWN STRL REUS W/ TWL LRG LVL3 (GOWN DISPOSABLE) ×1 IMPLANT
KIT TURNOVER KIT A (KITS) ×1 IMPLANT
MANIFOLD NEPTUNE II (INSTRUMENTS) ×1 IMPLANT
NDL SAFETY ECLIP 18X1.5 (MISCELLANEOUS) ×1 IMPLANT
NEEDLE ASPIRATION 22 (NEEDLE) ×1 IMPLANT
PACK CYSTO (CUSTOM PROCEDURE TRAY) ×1 IMPLANT
SYR 20ML LL LF (SYRINGE) ×1 IMPLANT
SYR CONTROL 10ML LL (SYRINGE) ×1 IMPLANT
SYSTEM URETHRAL BULK BULKAMID (Female Continence) IMPLANT
TUBING CONNECTING 10 (TUBING) ×1 IMPLANT
TUBING UROLOGY SET (TUBING) ×1 IMPLANT
WATER STERILE IRR 3000ML UROMA (IV SOLUTION) ×1 IMPLANT

## 2024-08-21 NOTE — Interval H&P Note (Signed)
 History and Physical Interval Note:  08/21/2024 10:51 AM  Beth Christian  has presented today for surgery, with the diagnosis of MIXED FEMALE URINARY INCONTINENCE.  The various methods of treatment have been discussed with the patient and family. After consideration of risks, benefits and other options for treatment, the patient has consented to  Procedures with comments: CYSTOSCOPY, WITH INJECTION OF BLADDER NECK OR BLADDER WALL (N/A) - CYSTOSCOPY WITH BOTOX  AND BULKAMID INJECTION, BULKING AGENT, URETHRA (N/A) as a surgical intervention.  The patient's history has been reviewed, patient examined, no change in status, stable for surgery.  I have reviewed the patient's chart and labs.  Questions were answered to the patient's satisfaction.     [name] D Lilianna Case

## 2024-08-21 NOTE — Op Note (Signed)
 Operative Note   Preoperative diagnosis:  1.  Mixed urinary incontinence   Postoperative diagnosis: 1.  Mixed urinary incontinence   Procedure(s): 1.  Cystoscopy with injection of bulkamid and botox  100 units   Surgeon: Valli Shank, MD   Assistants:  None   Anesthesia:  General   Complications:  None   EBL:  minimal   Specimens: 1. none   Drains/Catheters: 1.  none   Intraoperative findings:   Normal urethra Bilateral orthotopic Uos Normal bladder mucosa   Indication: 73yo woman with symptomatic mixed urinary incontinence.   Description of procedure:   After risks and benefits of the procedure discussed with the patient, informed consent was obtained.  The patient was taken to the operating placed in the supine position.  Anesthesia was induced and antibiotics were administered.  The patient was then repositioned in the dorsolithotomy position.  She was prepped and draped in usual sterile fashion a timeout performed with the attending present.   The injection cystoscope was assembled and advanced into the bladder under direct visualization.  The needle was advanced into view.  100 units of Botox  mixed in 10 cc of injectable saline were then injected and 1 cc increments over the posterior wall of the bladder in a standard fashion.  Care was taken to avoid the trigone and ureteral orifices.  The cystoscope was removed.  The cystoscope was assembled with the Bulkamid system.  It was then placed in the urethral meatus and advanced into the bladder under direct visualization.   The cystoscope was brought back to the bladder neck and the needle was advanced through the needle guide at the 1 o'clock position.  Once it was visualized and advanced it was rotated to the 5 o'clock position.  Bulkamid was then injected until blood was seen.  This was then repeated at the 1 o'clock position in the 7 o'clock position until coaptation was noted.   This concluded the case.  The  patient's bladder was left with approximately 200 cc of sterile saline.  The patient emerged from anesthesia and was transferred the PACU in stable condition.   Plan:  Plan for patient to void in PACU prior to discharge.

## 2024-08-21 NOTE — Anesthesia Postprocedure Evaluation (Signed)
"   Anesthesia Post Note  Patient: Beth Christian  Procedure(s) Performed: CYSTOSCOPY, WITH INJECTION OF BLADDER NECK OR BLADDER WALL (Bladder) INJECTION, BULKING AGENT, URETHRA (Bladder)     Patient location during evaluation: PACU Anesthesia Type: MAC Level of consciousness: awake and alert Pain management: pain level controlled Vital Signs Assessment: post-procedure vital signs reviewed and stable Respiratory status: spontaneous breathing, nonlabored ventilation, respiratory function stable and patient connected to nasal cannula oxygen Cardiovascular status: stable and blood pressure returned to baseline Postop Assessment: no apparent nausea or vomiting Anesthetic complications: no   No notable events documented.  Last Vitals:  Vitals:   08/21/24 0933 08/21/24 1147  BP: (!) 133/53 (!) 105/50  Pulse: (!) 55 (!) 50  Resp: 17 13  Temp: 36.4 C (!) 36.3 C  SpO2: 100% 100%    Last Pain:  Vitals:   08/21/24 1147  TempSrc:   PainSc: 0-No pain                 Yohanna Tow      "

## 2024-08-21 NOTE — Anesthesia Procedure Notes (Signed)
 Procedure Name: MAC Date/Time: 08/21/2024 11:20 AM  Performed by: Metta Andrea NOVAK, CRNAPre-anesthesia Checklist: Patient identified, Emergency Drugs available, Suction available, Patient being monitored and Timeout performed Oxygen Delivery Method: Simple face mask Placement Confirmation: positive ETCO2

## 2024-08-21 NOTE — Transfer of Care (Signed)
 Immediate Anesthesia Transfer of Care Note  Patient: Beth Christian  Procedure(s) Performed: CYSTOSCOPY, WITH INJECTION OF BLADDER NECK OR BLADDER WALL (Bladder) INJECTION, BULKING AGENT, URETHRA (Bladder)  Patient Location: PACU  Anesthesia Type:MAC  Level of Consciousness: awake, alert , and patient cooperative  Airway & Oxygen Therapy: Patient Spontanous Breathing and Patient connected to face mask oxygen  Post-op Assessment: Report given to RN and Post -op Vital signs reviewed and stable  Post vital signs: Reviewed and stable1  Last Vitals:  Vitals Value Taken Time  BP 105/50   Temp    Pulse 50 08/21/24 11:48  Resp 13 08/21/24 11:48  SpO2 100 % 08/21/24 11:48  Vitals shown include unfiled device data.  Last Pain:  Vitals:   08/21/24 0948  TempSrc:   PainSc: 0-No pain         Complications: No notable events documented.

## 2024-08-21 NOTE — Discharge Instructions (Addendum)
 Cystoscopy with Bulkamid and Botox  patient instructions  Following a cystoscopy, a catheter (a flexible rubber tube) is sometimes left in place to empty the bladder. This may cause some discomfort or a feeling that you need to urinate. Your doctor determines the period of time that the catheter will be left in place. You may have bloody urine for two to three days (Call your doctor if the amount of bleeding increases or does not subside).  Medications: AZO is a medication found over the counter at the pharmacy that can help with burning with urination Tylenol  can be used  You may pass blood clots in your urine, especially if you had a biopsy. It is not unusual to pass small blood clots and have some bloody urine a couple of weeks after your cystoscopy. Again, call your doctor if the bleeding does not subside. You may have: Dysuria (painful urination) Frequency (urinating often) Urgency (strong desire to urinate)  These symptoms are common especially if medicine is instilled into the bladder or a ureteral stent is placed. Avoiding alcohol and caffeine, such as coffee, tea, and chocolate, may help relieve these symptoms. Drink plenty of water , unless otherwise instructed. Your doctor may also prescribe an antibiotic or other medicine to reduce these symptoms.  Cystoscopy results are available soon after the procedure; biopsy results usually take two to four days. Your doctor will discuss the results of your exam with you. Before you go home, you will be given specific instructions for follow-up care. Special Instructions:   If you are going home with a catheter in place do not take a tub bath until removed by your doctor.   You may resume your normal activities.   Do not drive or operate machinery if you are taking narcotic pain medicine.   Be sure to keep all follow-up appointments with your doctor.   Call Your Doctor If: The catheter is not draining You have severe pain You are unable to  urinate You have a fever over 101 You have severe bleeding

## 2024-08-22 ENCOUNTER — Encounter (HOSPITAL_COMMUNITY): Payer: Self-pay | Admitting: Urology
